# Patient Record
Sex: Female | Born: 1950 | Race: Black or African American | Hispanic: No | Marital: Married | Smoking: Never smoker
Health system: Southern US, Community
[De-identification: ages and names within clinical notes are randomized; demographics above are authoritative.]

## PROBLEM LIST (undated history)

## (undated) DIAGNOSIS — E119 Type 2 diabetes mellitus without complications: Secondary | ICD-10-CM

---

## 2000-02-24 ENCOUNTER — Other Ambulatory Visit: Admission: RE | Admit: 2000-02-24 | Discharge: 2000-02-24 | Payer: Self-pay | Admitting: Internal Medicine

## 2001-02-14 ENCOUNTER — Encounter: Admission: RE | Admit: 2001-02-14 | Discharge: 2001-05-15 | Payer: Self-pay | Admitting: Internal Medicine

## 2002-01-20 ENCOUNTER — Ambulatory Visit (HOSPITAL_BASED_OUTPATIENT_CLINIC_OR_DEPARTMENT_OTHER): Admission: RE | Admit: 2002-01-20 | Discharge: 2002-01-20 | Payer: Self-pay | Admitting: Orthopedic Surgery

## 2002-11-22 ENCOUNTER — Encounter: Admission: RE | Admit: 2002-11-22 | Discharge: 2002-11-22 | Payer: Self-pay | Admitting: Internal Medicine

## 2002-11-22 ENCOUNTER — Encounter: Payer: Self-pay | Admitting: Internal Medicine

## 2008-04-21 ENCOUNTER — Emergency Department (HOSPITAL_COMMUNITY): Admission: EM | Admit: 2008-04-21 | Discharge: 2008-04-21 | Payer: Self-pay | Admitting: Emergency Medicine

## 2010-06-24 LAB — URINALYSIS, ROUTINE W REFLEX MICROSCOPIC
Ketones, ur: NEGATIVE mg/dL
Leukocytes, UA: NEGATIVE
Nitrite: NEGATIVE
Protein, ur: NEGATIVE mg/dL

## 2010-06-24 LAB — CBC
HCT: 36.6 % (ref 36.0–46.0)
MCV: 86.3 fL (ref 78.0–100.0)
Platelets: 236 10*3/uL (ref 150–400)
RDW: 13.5 % (ref 11.5–15.5)

## 2010-06-24 LAB — COMPREHENSIVE METABOLIC PANEL
Albumin: 3.9 g/dL (ref 3.5–5.2)
BUN: 15 mg/dL (ref 6–23)
Creatinine, Ser: 0.67 mg/dL (ref 0.4–1.2)
Potassium: 3.7 mEq/L (ref 3.5–5.1)
Total Protein: 7 g/dL (ref 6.0–8.3)

## 2010-06-24 LAB — DIFFERENTIAL
Lymphocytes Relative: 44 % (ref 12–46)
Monocytes Absolute: 0.4 10*3/uL (ref 0.1–1.0)
Monocytes Relative: 9 % (ref 3–12)
Neutro Abs: 1.9 10*3/uL (ref 1.7–7.7)

## 2010-06-24 LAB — PREGNANCY, URINE: Preg Test, Ur: NEGATIVE

## 2010-06-24 LAB — TYPE AND SCREEN
ABO/RH(D): O POS
Antibody Screen: NEGATIVE

## 2010-07-25 NOTE — Op Note (Signed)
Jacqueline Bell, Jacqueline Bell                        ACCOUNT NO.:  [id number]   MEDICAL RECORD NO.:  [id number]                   PATIENT TYPE:  AMB   LOCATION:  [facility]SC                                  FACILITY:  [facility]   PHYSICIAN:  Harvie Junior, M.D.                DATE OF BIRTH:  [birth year]   DATE OF PROCEDURE:  01/20/2002  DATE OF DISCHARGE:                                 OPERATIVE REPORT   PREOPERATIVE DIAGNOSIS:  Medial compartment pain with suspected meniscal  tear.   POSTOPERATIVE DIAGNOSES:  1. Medial meniscal tear.  2. 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  3. Chondromalacia of the patella.   PROCEDURES:  1. Debridement of chondromalacia of the medial femoral condyle and medial     tibial plateau.  2. Resection of medial meniscus tear.  3. Debridement of lateral meniscal tear.  4. Debridement of chondromalacia of the patella.   SURGEON:  Harvie Junior, M.D.   ASSISTANT:  Marshia Ly, P.A.   ANESTHESIA:  Knee block with MAC.   BRIEF HISTORY:  She is a 60 year old female with a long history of having  significant complaints over the medial compartment.  She ultimately had  significant pain with twisting and movement and because of continued  complaints of pain on the medial side, she was ultimately taken to the  operating room for operative knee arthroscopy.   DESCRIPTION OF PROCEDURE:  The patient was taken to the operating room where  after adequate anesthesia was obtained with a general anesthetic, the  patient underwent a knee block.  The patient was placed supine upon the  operating table.  The left leg was prepped and draped in the usual sterile  fashion and following this, routine arthroscopic examination of the knee  revealed that there was an obvious chondromalacia of the medial femoral  condyle and medial tibial plateau.  The medial meniscus was torn on the  posterior portion.  At this point the posterior horn of the  medial meniscus  was debrided with a combination of straight biting forceps, upbiting  forceps.  The remainder of the meniscal rim was contoured down with a  suction shaver.  Following this, the attention was turned to the patella  where there was some chondromalacia of the patella, which was debrided.  The  lateral side was examined and noted to have a little bit of fibrillation on  the outer edge of the meniscus, not felt to be enough to need debridement  other than with a suction shaver.  The lateral femoral condyle looked  without abnormality.  Following this the medial femoral condyle and medial  tibial plateau were debrided to a smooth and stable rim.  Once this was  accomplished, the knee was then copiously irrigated and suctioned dry.  A  final check was made for any loose or fragmenting pieces.  The patient was  taken  to the recovery room, where she was noted to be in satisfactory  condition.  Estimated blood loss for the procedure was none.                                               Harvie Junior, M.D.    Ranae Plumber  D:  01/20/2002  T:  01/20/2002  Job:  [id number]

## 2016-10-05 ENCOUNTER — Emergency Department (HOSPITAL_COMMUNITY)
Admission: EM | Admit: 2016-10-05 | Discharge: 2016-10-05 | Disposition: A | Discharge status: Home / Self Care | Payer: TRICARE For Life (TFL) | Attending: Emergency Medicine | Admitting: Emergency Medicine

## 2016-10-05 ENCOUNTER — Encounter (HOSPITAL_COMMUNITY): Payer: Self-pay | Admitting: Emergency Medicine

## 2016-10-05 DIAGNOSIS — Y658 Other specified misadventures during surgical and medical care: Secondary | ICD-10-CM | POA: Diagnosis not present

## 2016-10-05 DIAGNOSIS — E119 Type 2 diabetes mellitus without complications: Secondary | ICD-10-CM | POA: Insufficient documentation

## 2016-10-05 DIAGNOSIS — Z794 Long term (current) use of insulin: Secondary | ICD-10-CM | POA: Diagnosis not present

## 2016-10-05 DIAGNOSIS — R05 Cough: Secondary | ICD-10-CM | POA: Diagnosis not present

## 2016-10-05 DIAGNOSIS — Z79899 Other long term (current) drug therapy: Secondary | ICD-10-CM | POA: Diagnosis not present

## 2016-10-05 DIAGNOSIS — R0981 Nasal congestion: Secondary | ICD-10-CM | POA: Insufficient documentation

## 2016-10-05 DIAGNOSIS — T7840XA Allergy, unspecified, initial encounter: Secondary | ICD-10-CM | POA: Diagnosis not present

## 2016-10-05 DIAGNOSIS — R21 Rash and other nonspecific skin eruption: Secondary | ICD-10-CM | POA: Diagnosis present

## 2016-10-05 DIAGNOSIS — T50905A Adverse effect of unspecified drugs, medicaments and biological substances, initial encounter: Secondary | ICD-10-CM | POA: Diagnosis not present

## 2016-10-05 HISTORY — DX: Type 2 diabetes mellitus without complications: E11.9

## 2016-10-05 LAB — CBG MONITORING, ED: Glucose-Capillary: 318 mg/dL — ABNORMAL HIGH (ref 65–99)

## 2016-10-05 MED ORDER — CETIRIZINE HCL 10 MG PO TABS
10.0000 mg | ORAL_TABLET | Freq: Every day | ORAL | 0 refills | Status: DC | PRN
Start: 2016-10-05 — End: 2018-02-24

## 2016-10-05 MED ORDER — PREDNISONE 20 MG PO TABS: 40.0000 mg | ORAL_TABLET | Freq: Every day | ORAL | 0 refills | Status: DC

## 2016-10-05 NOTE — ED Provider Notes (Signed)
[facility] DEPT Provider Note   CSN: [id number] Arrival date & time: 10/05/16  1012     History   Chief Complaint Chief Complaint  Patient presents with  . Allergic Reaction    HPI Jacqueline SakaiRosalind R Hovanec is a 66 y.o. female.  HPI Patient presents with rash and itchy for the last around 5 days. Began at injection sites for her insulin or her Bydureon. She recently started the weekly injections of that. States it started with a purplish swollen area. Has site on her abdomen and right thigh. She has however developed a rash more diffusely. Also on the arms chest and back. No fevers. Has had some nasal congestion also. They go running. Occasional cough. No fevers. She has been on the Bydureon for about a month now. She has been on the NovoLog and Lantus prolonged without reactions in the past. She has taken Benadryl for states it makes her sleepy. Also makes her little nauseous.  Past Medical History:  Diagnosis Date  . Diabetes mellitus without complication (HCC)     There are no active problems to display for this patient.   History reviewed. No pertinent surgical history.  OB History    No data available       Home Medications    Prior to Admission medications   Medication Sig Start Date End Date Taking? Authorizing Provider  glimepiride (AMARYL) 1 MG tablet Take 1 mg by mouth daily with breakfast.   Yes [provider]  insulin aspart (NOVOLOG) 100 UNIT/ML injection Inject 0-10 Units into the skin 3 (three) times daily with meals. Per SS: < 70 = 0; 70-90 = 4 units; 91-130 = 6 units; 131-150 = 7 units; 151-200 = 8 units; 201-250 = 9 units; 251-300 = 10 units; 301-350 = 11 units; 351-400 = 12 units; 401-450 = 13 units; > 450 = 14 units.   Yes [provider]  insulin glargine (LANTUS) 100 unit/mL SOPN Inject 20-26 Units into the skin at bedtime.   Yes [provider]  meloxicam (MOBIC) 7.5 MG tablet Take 7.5 mg by mouth daily as needed for pain.   Yes  [provider]  cetirizine (ZYRTEC) 10 MG tablet Take 1 tablet (10 mg total) by mouth daily as needed for allergies. 10/05/16   Benjiman CorePickering, Amahri Dengel, MD  predniSONE (DELTASONE) 20 MG tablet Take 2 tablets (40 mg total) by mouth daily. 10/05/16   Benjiman CorePickering, Luqman Perrelli, MD    Family History History reviewed. No pertinent family history.  Social History Social History  Substance Use Topics  . Smoking status: Never Smoker  . Smokeless tobacco: Never Used  . Alcohol use No     Allergies   Patient has no known allergies.   Review of Systems Review of Systems  Constitutional: Negative for appetite change and fever.  HENT: Positive for congestion. Negative for facial swelling and trouble swallowing.   Respiratory: Positive for cough.   Gastrointestinal: Negative for abdominal pain.  Genitourinary: Negative for flank pain.  Musculoskeletal: Negative for back pain.  Skin: Positive for rash and wound.  Neurological: Negative for seizures.  Hematological: Negative for adenopathy.  Psychiatric/Behavioral: Negative for confusion.     Physical Exam Updated Vital Signs BP (!) 171/91 (BP Location: Right Arm)   Pulse 88   Temp 98.4 F (36.9 C) (Oral)   Resp 20   Ht 5\' 9"  (1.753 m)   Wt 94.3 kg (208 lb)   SpO2 97%   BMI 30.72 kg/m   Physical Exam  Constitutional: She appears well-developed.  HENT:  Head: Atraumatic.  Normal mucous membranes  Eyes: EOM are normal.  Cardiovascular: Normal rate.   Pulmonary/Chest: Effort normal.  Abdominal: Soft. She exhibits no distension.  Musculoskeletal: She exhibits no edema.  Neurological: She is alert.  Skin: Skin is warm.  Somewhat diffuse hives. Worse on extremities but does have a focus of worsening disease on her right thigh and lower right abdomen. On the center of the fine abdomen there is more indurated somewhat darkened area where she done the injections.  Psychiatric: She has a normal mood and affect.     ED Treatments /  Results  Labs (all labs ordered are listed, but only abnormal results are displayed) Labs Reviewed  CBG MONITORING, ED - Abnormal; Notable for the following:       Result Value   Glucose-Capillary 318 (*)    All other components within normal limits    EKG  EKG Interpretation None       Radiology No results found.  Procedures Procedures (including critical care time)  Medications Ordered in ED Medications - No data to display   Initial Impression / Assessment and Plan / ED Course  I have reviewed the triage vital signs and the nursing notes.  Pertinent labs & imaging results that were available during my care of the patient were reviewed by me and considered in my medical decision making (see chart for details).     Patient with like a reaction to her Bydureon. This is a new medication. Allergy versus side effect. We'll give steroids for 3 days. Add cetirizine. Patient instructed to watch her sugars closely.  Final Clinical Impressions(s) / ED Diagnoses   Final diagnoses:  Allergic reaction, initial encounter  Adverse reaction to drug in therapeutic use    New Prescriptions New Prescriptions   CETIRIZINE (ZYRTEC) 10 MG TABLET    Take 1 tablet (10 mg total) by mouth daily as needed for allergies.   PREDNISONE (DELTASONE) 20 MG TABLET    Take 2 tablets (40 mg total) by mouth daily.     Benjiman CorePickering, Dewain Platz, MD 10/05/16 [phone]

## 2016-10-05 NOTE — Discharge Instructions (Signed)
Take Benadryl and Zyrtec as needed. Take prednisone for the next 3 days but watch her sugars. Call Dr. Allyne GeeSanders about adjustment of your medications.

## 2016-10-05 NOTE — ED Triage Notes (Addendum)
Patient complaining of generalized hives and itching x 5 days. States she now has cough and nasal congestion. Patient states "I think it has something to do with my novolog because I have the red places where I give myself the shots."

## 2017-02-08 DIAGNOSIS — Z1231 Encounter for screening mammogram for malignant neoplasm of breast: Secondary | ICD-10-CM | POA: Diagnosis not present

## 2017-04-29 DIAGNOSIS — E559 Vitamin D deficiency, unspecified: Secondary | ICD-10-CM | POA: Diagnosis not present

## 2017-04-29 DIAGNOSIS — E119 Type 2 diabetes mellitus without complications: Secondary | ICD-10-CM | POA: Diagnosis not present

## 2017-04-29 DIAGNOSIS — M25522 Pain in left elbow: Secondary | ICD-10-CM | POA: Diagnosis not present

## 2017-04-29 DIAGNOSIS — Z23 Encounter for immunization: Secondary | ICD-10-CM | POA: Diagnosis not present

## 2017-04-29 DIAGNOSIS — Z0000 Encounter for general adult medical examination without abnormal findings: Secondary | ICD-10-CM | POA: Diagnosis not present

## 2017-04-29 DIAGNOSIS — I10 Essential (primary) hypertension: Secondary | ICD-10-CM | POA: Diagnosis not present

## 2017-04-29 DIAGNOSIS — E1165 Type 2 diabetes mellitus with hyperglycemia: Secondary | ICD-10-CM | POA: Diagnosis not present

## 2017-06-09 DIAGNOSIS — H04123 Dry eye syndrome of bilateral lacrimal glands: Secondary | ICD-10-CM | POA: Diagnosis not present

## 2017-06-09 DIAGNOSIS — H35372 Puckering of macula, left eye: Secondary | ICD-10-CM | POA: Diagnosis not present

## 2017-06-09 DIAGNOSIS — Z961 Presence of intraocular lens: Secondary | ICD-10-CM | POA: Diagnosis not present

## 2017-06-09 DIAGNOSIS — H26492 Other secondary cataract, left eye: Secondary | ICD-10-CM | POA: Diagnosis not present

## 2017-06-09 LAB — HM DIABETES EYE EXAM

## 2017-07-29 DIAGNOSIS — E11311 Type 2 diabetes mellitus with unspecified diabetic retinopathy with macular edema: Secondary | ICD-10-CM | POA: Diagnosis not present

## 2017-07-29 DIAGNOSIS — Z794 Long term (current) use of insulin: Secondary | ICD-10-CM | POA: Diagnosis not present

## 2017-07-29 DIAGNOSIS — E785 Hyperlipidemia, unspecified: Secondary | ICD-10-CM | POA: Diagnosis not present

## 2017-07-29 DIAGNOSIS — I10 Essential (primary) hypertension: Secondary | ICD-10-CM | POA: Diagnosis not present

## 2017-10-27 DIAGNOSIS — E785 Hyperlipidemia, unspecified: Secondary | ICD-10-CM | POA: Diagnosis not present

## 2017-10-27 DIAGNOSIS — M25522 Pain in left elbow: Secondary | ICD-10-CM | POA: Diagnosis not present

## 2017-10-27 DIAGNOSIS — E11311 Type 2 diabetes mellitus with unspecified diabetic retinopathy with macular edema: Secondary | ICD-10-CM | POA: Diagnosis not present

## 2018-01-17 ENCOUNTER — Ambulatory Visit: Payer: Self-pay | Admitting: Nurse Practitioner

## 2018-02-24 ENCOUNTER — Ambulatory Visit (INDEPENDENT_AMBULATORY_CARE_PROVIDER_SITE_OTHER): Payer: Medicare Other | Admitting: Nurse Practitioner

## 2018-02-24 ENCOUNTER — Encounter: Payer: Self-pay | Admitting: Nurse Practitioner

## 2018-02-24 VITALS — BP 142/82 | HR 72 | Temp 98.4°F | Ht 65.8 in | Wt 197.8 lb

## 2018-02-24 DIAGNOSIS — Z91018 Allergy to other foods: Secondary | ICD-10-CM

## 2018-02-24 DIAGNOSIS — I10 Essential (primary) hypertension: Secondary | ICD-10-CM | POA: Diagnosis not present

## 2018-02-24 DIAGNOSIS — R252 Cramp and spasm: Secondary | ICD-10-CM | POA: Insufficient documentation

## 2018-02-24 DIAGNOSIS — R030 Elevated blood-pressure reading, without diagnosis of hypertension: Secondary | ICD-10-CM

## 2018-02-24 DIAGNOSIS — G4700 Insomnia, unspecified: Secondary | ICD-10-CM

## 2018-02-24 DIAGNOSIS — E119 Type 2 diabetes mellitus without complications: Secondary | ICD-10-CM

## 2018-02-24 DIAGNOSIS — Z794 Long term (current) use of insulin: Secondary | ICD-10-CM | POA: Diagnosis not present

## 2018-02-24 MED ORDER — SEMAGLUTIDE(0.25 OR 0.5MG/DOS) 2 MG/1.5ML ~~LOC~~ SOPN: 1.0000 mg | PEN_INJECTOR | SUBCUTANEOUS | 1 refills | Status: DC

## 2018-02-24 NOTE — Progress Notes (Addendum)
Subjective:     Patient ID: Jacqueline SakaiRosalind R Ehler , female    DOB: 05/16/1950 , 67 y.o.   MRN: [id number]   Chief Complaint  Patient presents with  . Hypertension  . Diabetes  . Referral    patient states she wants a referral to see an allergist she wants to know if she is allergic to anything     HPI  Hypertension  This is a chronic problem. The current episode started more than 1 year ago. The problem has been gradually worsening since onset. The problem is uncontrolled. Pertinent negatives include no anxiety, chest pain, malaise/fatigue or palpitations. There are no associated agents to hypertension. There are no known risk factors for coronary artery disease. Past treatments include diuretics. There are no compliance problems.  There is no history of angina. There is no history of chronic renal disease.  Diabetes  She presents for her follow-up diabetic visit. She has type 2 diabetes mellitus. Her disease course has been stable. Pertinent negatives for diabetes include no chest pain. Symptoms are stable. She is following a generally healthy diet. When asked about meal planning, she reported none. She has not had a previous visit with a dietitian. She participates in exercise weekly. (112 this am and up to 225, had forgotten her insulin.  ) Eye exam is current (april 3rd, negative for retinopathy).  Insomnia  Primary symptoms: fragmented sleep (up using the bathroom frequently), frequent awakening, no malaise/fatigue.  The current episode started more than one month. The onset quality is gradual. The problem occurs intermittently. How many beverages per day that contain caffeine: 0 - 1.  The symptoms are relieved by quiet environment. The treatment provided no relief. PMH includes: associated symptoms present, no chronic pain. Prior diagnostic workup includes:  No prior workup.     Past Medical History:  Diagnosis Date  . Diabetes mellitus without complication (HCC)      No family history  on file.   Current Outpatient Medications:  .  cholecalciferol (VITAMIN D) 1000 units tablet, Take 1,000 Units by mouth daily., Disp: , Rfl:  .  insulin aspart (NOVOLOG) 100 UNIT/ML injection, Inject 0-10 Units into the skin 3 (three) times daily with meals. Per SS: < 70 = 0; 70-90 = 4 units; 91-130 = 6 units; 131-150 = 7 units; 151-200 = 8 units; 201-250 = 9 units; 251-300 = 10 units; 301-350 = 11 units; 351-400 = 12 units; 401-450 = 13 units; > 450 = 14 units., Disp: , Rfl:  .  insulin glargine (LANTUS) 100 unit/mL SOPN, Inject 20-26 Units into the skin at bedtime., Disp: , Rfl:  .  meloxicam (MOBIC) 7.5 MG tablet, Take 7.5 mg by mouth daily as needed for pain., Disp: , Rfl:  .  Multiple Vitamin (MULTIVITAMIN) tablet, Take 1 tablet by mouth daily., Disp: , Rfl:  .  Omega-3 Fatty Acids (FISH OIL PO), Take 1 capsule by mouth daily., Disp: , Rfl:  .  POTASSIUM BICARBONATE PO, Take 1 tablet by mouth. Patient takes 2 tablets daily, Disp: , Rfl:  .  Semaglutide,0.25 or 0.5MG /DOS, (OZEMPIC, 0.25 OR 0.5 MG/DOSE,) 2 MG/1.5ML SOPN, Inject into the skin. Inject 0.5mg  by subcutaenous route once weekly into the abdomen, thigh or upper arm rotating injection sites, Disp: , Rfl:  .  telmisartan (MICARDIS) 20 MG tablet, Take 20 mg by mouth daily., Disp: , Rfl:    No Known Allergies   Review of Systems  Constitutional: Negative for malaise/fatigue.  Eyes: Negative.  Respiratory: Negative.   Cardiovascular: Negative for chest pain, palpitations and leg swelling.  Gastrointestinal: Negative.   Musculoskeletal: Negative.   Psychiatric/Behavioral: The patient has insomnia.      Today's Vitals   02/24/18 1155  BP: (!) 142/82  Pulse: 72  Temp: 98.4 F (36.9 C)  TempSrc: Oral  SpO2: 96%  Weight: 197 lb 12.8 oz (89.7 kg)  Height: 5' 5.8" (1.671 m)  PainSc: 0-No pain   Body mass index is 32.12 kg/m.   Objective:  Physical Exam Constitutional:      Appearance: Normal appearance.  Cardiovascular:      Rate and Rhythm: Normal rate.  Pulmonary:     Effort: Pulmonary effort is normal.     Breath sounds: Normal breath sounds.  Musculoskeletal: Normal range of motion.  Skin:    General: Skin is warm and dry.     Capillary Refill: Capillary refill takes less than 2 seconds.  Neurological:     General: No focal deficit present.     Mental Status: She is alert and oriented to person, place, and time.  Psychiatric:        Mood and Affect: Mood normal.        Assessment And Plan:     1. Type 2 diabetes mellitus without complication, with long-term current use of insulin (HCC)  Chronic, controlled  Continue with current medications  Encouraged to limit intake of sugary foods and drinks  Encouraged to increase physical activity to 150 minutes per week  She is doing better on ozempic, will increase to 1 mg weekly - Semaglutide,0.25 or 0.5MG /DOS, (OZEMPIC, 0.25 OR 0.5 MG/DOSE,) 2 MG/1.5ML SOPN; Inject 1 mg into the skin once a week. Inject 0.5mg  by subcutaenous route once weekly into the abdomen, thigh or upper arm rotating injection sites  Dispense: 6 pen; Refill: 1 - Hemoglobin A1c - CMP14 + Anion Gap  2. 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 Cramp and spasm  Will check magnesium and CK due to persistent muscle cramping  Encouraged to stay well hydrated and take magnesium 200 mg daily at dinner time - Magnesium - CK, total  4. Insomnia, unspecified type  Change the environment  Take melatonin as needed   Limit intake of water after 7p  5. Elevated blood pressure reading without diagnosis of hypertension  Slightly elevated blood pressure today she does not have a history of HTN  On Telmisartan for kidney protection.   She is encouraged to increase her water intake which can improve her blood pressure   Will reevaluate at the next visit  Arnette FeltsJanece Mukund Weinreb, FNP

## 2018-02-24 NOTE — Patient Instructions (Addendum)
Leg Cramps Leg cramps occur when one or more muscles tighten and you have no control over this tightening (involuntary muscle contraction). Muscle cramps can develop in any muscle, but the most common place is in the calf muscles of the leg. Those cramps can occur during exercise or when you are at rest. Leg cramps are painful, and they may last for a few seconds to a few minutes. Cramps may return several times before they finally stop. Usually, leg cramps are not caused by a serious medical problem. In many cases, the cause is not known. Some common causes include:  Excessive physical effort (overexertion), such as during intense exercise.  Overuse from repetitive motions, or doing the same thing over and over.  Staying in a certain position for a long period of time.  Improper preparation, form, or technique while performing a sport or an activity.  Dehydration.  Injury.  Side effects of certain medicines.  Abnormally low levels of minerals in your blood (electrolytes), especially potassium and calcium. This could result from: ? Pregnancy. ? Taking diuretic medicines. Follow these instructions at home: Eating and drinking  Drink enough fluid to keep your urine pale yellow. Staying hydrated may help prevent cramps.  Eat a healthy diet that includes plenty of nutrients to help your muscles function. A healthy diet includes fruits and vegetables, lean protein, whole grains, and low-fat or nonfat dairy products. Managing pain, stiffness, and swelling      Try massaging, stretching, and relaxing the affected muscle. Do this for several minutes at a time.  If directed, put ice on areas that are sore or painful after a cramp: ? Put ice in a plastic bag. ? Place a towel between your skin and the bag. ? Leave the ice on for 20 minutes, 2-3 times a day.  If directed, apply heat to muscles that are tense or tight. Do this before you exercise, or as often as told by your health care  provider. Use the heat source that your health care provider recommends, such as a moist heat pack or a heating pad. ? Place a towel between your skin and the heat source. ? Leave the heat on for 20-30 minutes. ? Remove the heat if your skin turns bright red. This is especially important if you are unable to feel pain, heat, or cold. You may have a greater risk of getting burned.  Try taking hot showers or baths to help relax tight muscles. General instructions  If you are having frequent leg cramps, avoid intense exercise for several days.  Take over-the-counter and prescription medicines only as told by your health care provider.  Keep all follow-up visits as told by your health care provider. This is important. Contact a health care provider if:  Your leg cramps get more severe or more frequent, or they do not improve over time.  Your foot becomes cold, numb, or blue. Summary  Muscle cramps can develop in any muscle, but the most common place is in the calf muscles of the leg.  Leg cramps are painful, and they may last for a few seconds to a few minutes.  Usually, leg cramps are not caused by a serious medical problem. Often, the cause is not known.  Stay hydrated and take over-the-counter and prescription medicines only as told by your health care provider. This information is not intended to replace advice given to you by your health care provider. Make sure you discuss any questions you have with your health care  provider. Document Released: 04/02/2004 Document Revised: 12/03/2016 Document Reviewed: 12/03/2016 Elsevier Interactive Patient Education  2019 [employer]    Insomnia Insomnia is a sleep disorder that makes it difficult to fall asleep or stay asleep. Insomnia can cause fatigue, low energy, difficulty concentrating, mood swings, and poor performance at work or school. There are three different ways to classify insomnia:  Difficulty falling asleep.  Difficulty  staying asleep.  Waking up too early in the morning. Any type of insomnia can be long-term (chronic) or short-term (acute). Both are common. Short-term insomnia usually lasts for three months or less. Chronic insomnia occurs at least three times a week for longer than three months. What are the causes? Insomnia may be caused by another condition, situation, or substance, such as:  Anxiety.  Certain medicines.  Gastroesophageal reflux disease (GERD) or other gastrointestinal conditions.  Asthma or other breathing conditions.  Restless legs syndrome, sleep apnea, or other sleep disorders.  Chronic pain.  Menopause.  Stroke.  Abuse of alcohol, tobacco, or illegal drugs.  Mental health conditions, such as depression.  Caffeine.  Neurological disorders, such as Alzheimer's disease.  An overactive thyroid (hyperthyroidism). Sometimes, the cause of insomnia may not be known. What increases the risk? Risk factors for insomnia include:  Gender. Women are affected more often than men.  Age. Insomnia is more common as you get older.  Stress.  Lack of exercise.  Irregular work schedule or working night shifts.  Traveling between different time zones.  Certain medical and mental health conditions. What are the signs or symptoms? If you have insomnia, the main symptom is having trouble falling asleep or having trouble staying asleep. This may lead to other symptoms, such as:  Feeling fatigued or having low energy.  Feeling nervous about going to sleep.  Not feeling rested in the morning.  Having trouble concentrating.  Feeling irritable, anxious, or depressed. How is this diagnosed? This condition may be diagnosed based on:  Your symptoms and medical history. Your health care provider may ask about: ? Your sleep habits. ? Any medical conditions you have. ? Your mental health.  A physical exam. How is this treated? Treatment for insomnia depends on the cause.  Treatment may focus on treating an underlying condition that is causing insomnia. Treatment may also include:  Medicines to help you sleep.  Counseling or therapy.  Lifestyle adjustments to help you sleep better. Follow these instructions at home: Eating and drinking   Limit or avoid alcohol, caffeinated beverages, and cigarettes, especially close to bedtime. These can disrupt your sleep.  Do not eat a large meal or eat spicy foods right before bedtime. This can lead to digestive discomfort that can make it hard for you to sleep. Sleep habits   Keep a sleep diary to help you and your health care provider figure out what could be causing your insomnia. Write down: ? When you sleep. ? When you wake up during the night. ? How well you sleep. ? How rested you feel the next day. ? Any side effects of medicines you are taking. ? What you eat and drink.  Make your bedroom a dark, comfortable place where it is easy to fall asleep. ? Put up shades or blackout curtains to block light from outside. ? Use a white noise machine to block noise. ? Keep the temperature cool.  Limit screen use before bedtime. This includes: ? Watching TV. ? Using your smartphone, tablet, or computer.  Stick to a routine that includes  going to bed and waking up at the same times every day and night. This can help you fall asleep faster. Consider making a quiet activity, such as reading, part of your nighttime routine.  Try to avoid taking naps during the day so that you sleep better at night.  Get out of bed if you are still awake after 15 minutes of trying to sleep. Keep the lights down, but try reading or doing a quiet activity. When you feel sleepy, go back to bed. General instructions  Take over-the-counter and prescription medicines only as told by your health care provider.  Exercise regularly, as told by your health care provider. Avoid exercise starting several hours before bedtime.  Use relaxation  techniques to manage stress. Ask your health care provider to suggest some techniques that may work well for you. These may include: ? Breathing exercises. ? Routines to release muscle tension. ? Visualizing peaceful scenes.  Make sure that you drive carefully. Avoid driving if you feel very sleepy.  Keep all follow-up visits as told by your health care provider. This is important. Contact a health care provider if:  You are tired throughout the day.  You have trouble in your daily routine due to sleepiness.  You continue to have sleep problems, or your sleep problems get worse. Get help right away if:  You have serious thoughts about hurting yourself or someone else. If you ever feel like you may hurt yourself or others, or have thoughts about taking your own life, get help right away. You can go to your nearest emergency department or call:  Your local emergency services (911 in the [place]  A suicide crisis helpline, such as the National Suicide Prevention Lifeline at 80318367141-(843) 281-2496. This is open 24 hours a day. Summary  Insomnia is a sleep disorder that makes it difficult to fall asleep or stay asleep.  Insomnia can be long-term (chronic) or short-term (acute).  Treatment for insomnia depends on the cause. Treatment may focus on treating an underlying condition that is causing insomnia.  Keep a sleep diary to help you and your health care provider figure out what could be causing your insomnia.     Change the environment  Take melatonin as needed   Limit intake of water after 7p    This information is not intended to replace advice given to you by your health care provider. Make sure you discuss any questions you have with your health care provider. Document Released: 02/21/2000 Document Revised: 12/03/2016 Document Reviewed: 12/03/2016 Elsevier Interactive Patient Education  2019 [employer]  Place diabetes mellitus type 2 patient instructions here.

## 2018-02-25 LAB — HEMOGLOBIN A1C
ESTIMATED AVERAGE GLUCOSE: 177 mg/dL
HEMOGLOBIN A1C: 7.8 % — AB (ref 4.8–5.6)

## 2018-02-25 LAB — CMP14 + ANION GAP
A/G RATIO: 1.6 (ref 1.2–2.2)
ALT: 13 IU/L (ref 0–32)
AST: 19 IU/L (ref 0–40)
Albumin: 4.4 g/dL (ref 3.6–4.8)
Alkaline Phosphatase: 47 IU/L (ref 39–117)
Anion Gap: 13 mmol/L (ref 10.0–18.0)
BUN/Creatinine Ratio: 15 (ref 12–28)
BUN: 11 mg/dL (ref 8–27)
Bilirubin Total: 0.4 mg/dL (ref 0.0–1.2)
CHLORIDE: 100 mmol/L (ref 96–106)
CO2: 26 mmol/L (ref 20–29)
Calcium: 9.3 mg/dL (ref 8.7–10.3)
Creatinine, Ser: 0.73 mg/dL (ref 0.57–1.00)
GFR calc Af Amer: 99 mL/min/{1.73_m2} (ref >59)
GFR calc non Af Amer: 86 mL/min/{1.73_m2} (ref >59)
GLUCOSE: 121 mg/dL — AB (ref 65–99)
Globulin, Total: 2.8 g/dL (ref 1.5–4.5)
POTASSIUM: 3.8 mmol/L (ref 3.5–5.2)
Sodium: 139 mmol/L (ref 134–144)
Total Protein: 7.2 g/dL (ref 6.0–8.5)

## 2018-02-25 LAB — LIPID PANEL
Chol/HDL Ratio: 4 ratio (ref 0.0–4.4)
Cholesterol, Total: 216 mg/dL — ABNORMAL HIGH (ref 100–199)
HDL: 54 mg/dL (ref >39)
LDL Calculated: 147 mg/dL — ABNORMAL HIGH (ref 0–99)
Triglycerides: 75 mg/dL (ref 0–149)
VLDL Cholesterol Cal: 15 mg/dL (ref 5–40)

## 2018-02-25 LAB — CK: Total CK: 284 U/L — ABNORMAL HIGH (ref 24–173)

## 2018-02-25 LAB — MAGNESIUM: MAGNESIUM: 1.9 mg/dL (ref 1.6–2.3)

## 2018-03-01 ENCOUNTER — Other Ambulatory Visit: Payer: Self-pay

## 2018-03-01 DIAGNOSIS — E119 Type 2 diabetes mellitus without complications: Secondary | ICD-10-CM

## 2018-03-01 DIAGNOSIS — Z794 Long term (current) use of insulin: Principal | ICD-10-CM

## 2018-03-01 MED ORDER — SEMAGLUTIDE(0.25 OR 0.5MG/DOS) 2 MG/1.5ML ~~LOC~~ SOPN: 1.0000 mg | PEN_INJECTOR | SUBCUTANEOUS | 1 refills | Status: DC

## 2018-04-04 ENCOUNTER — Ambulatory Visit (INDEPENDENT_AMBULATORY_CARE_PROVIDER_SITE_OTHER): Payer: Medicare Other | Admitting: Allergy and Immunology

## 2018-04-04 ENCOUNTER — Encounter: Payer: Self-pay | Admitting: Allergy and Immunology

## 2018-04-04 VITALS — BP 150/70 | HR 67 | Resp 16 | Ht 67.5 in | Wt 200.0 lb

## 2018-04-04 DIAGNOSIS — J3089 Other allergic rhinitis: Secondary | ICD-10-CM | POA: Diagnosis not present

## 2018-04-04 DIAGNOSIS — L500 Allergic urticaria: Secondary | ICD-10-CM | POA: Diagnosis not present

## 2018-04-04 DIAGNOSIS — T781XXA Other adverse food reactions, not elsewhere classified, initial encounter: Secondary | ICD-10-CM | POA: Insufficient documentation

## 2018-04-04 DIAGNOSIS — H1013 Acute atopic conjunctivitis, bilateral: Secondary | ICD-10-CM | POA: Diagnosis not present

## 2018-04-04 DIAGNOSIS — T7840XD Allergy, unspecified, subsequent encounter: Secondary | ICD-10-CM

## 2018-04-04 DIAGNOSIS — T781XXD Other adverse food reactions, not elsewhere classified, subsequent encounter: Secondary | ICD-10-CM

## 2018-04-04 DIAGNOSIS — H1010 Acute atopic conjunctivitis, unspecified eye: Secondary | ICD-10-CM | POA: Insufficient documentation

## 2018-04-04 DIAGNOSIS — Z91018 Allergy to other foods: Secondary | ICD-10-CM

## 2018-04-04 DIAGNOSIS — T7840XA Allergy, unspecified, initial encounter: Secondary | ICD-10-CM | POA: Insufficient documentation

## 2018-04-04 MED ORDER — OLOPATADINE HCL 0.2 % OP SOLN: 1.0000 [drp] | Freq: Every day | OPHTHALMIC | 5 refills | Status: DC

## 2018-04-04 MED ORDER — EPINEPHRINE 0.3 MG/0.3ML IJ SOAJ: 0.3000 mg | Freq: Once | INTRAMUSCULAR | 2 refills | Status: AC

## 2018-04-04 MED ORDER — FLUTICASONE PROPIONATE 50 MCG/ACT NA SUSP: 1.0000 | Freq: Every day | NASAL | 5 refills | Status: DC

## 2018-04-04 MED ORDER — FEXOFENADINE HCL 180 MG PO TABS: 180.0000 mg | ORAL_TABLET | Freq: Every day | ORAL | 5 refills | Status: DC

## 2018-04-04 NOTE — Progress Notes (Signed)
New Patient Note  RE: Jacqueline Bell MRN: [id number] DOB: 07/25/1950 Date of Office Visit: 04/04/2018  Referring provider: Arnette FeltsMoore, Janece, FNP Primary care provider: Dorothyann PengSanders, Robyn, MD  Chief Complaint: Allergic Reaction; Food Intolerance; and Allergic Rhinitis    History of present illness: Jacqueline Bell is a 68 y.o. female seen today in consultation requested by Arnette FeltsJanece Moore, FNP.  She reports that when she was a child she developed hives on her hands after eating shrimp.  However this problem "went away" for several decades.  However, in August 2019 she consumed shrimp and within minutes developed generalized pruritus and urticaria.  She denies concomitant angioedema, cardiopulmonary symptoms, or GI symptoms, however proceeded to the emergency department for evaluation and treatment.  She states that she was not prescribed an epinephrine autoinjector.  She reports that if she eats finned fish she experiences severe heartburn.  She experiences mild oropharyngeal pruritus when consuming fresh pineapple.  If the pineapple is processed or cooked she does not experience the oropharyngeal pruritus. [name] experiences nasal congestion, rhinorrhea, sneezing, nasal pruritus, and ocular pruritus.  These symptoms occur year around but are most frequent and severe with pollen exposure, particularly in the springtime.  She attempts to control the symptoms with cetirizine, fexofenadine, and/or over-the-counter Visine eyedrops.  Assessment and plan: History of food allergy Possible food allergy.  The patients history suggests shellfish allergy, though todays skin tests were negative despite a positive histamine control.  Food allergen skin testing has excellent negative predictive value however there is still a 5% chance that the allergy exists.  Therefore, we will investigate further with serum specific IgE levels and, if negative, open graded oral challenge.  A laboratory order form has been  provided for baseline serum tryptase level, as well as serum specific IgE against shellfish panel, fish panel, pineapple, and alpha gal panel.  Until the food allergy has been definitively ruled out, the patient is to continue meticulous avoidance and have access to epinephrine autoinjector 2 pack.  A prescription has been provided for epinephrine 0.3 mg autoinjector 2 pack along with instructions for its proper administration.  Seasonal and perennial allergic rhinitis  Aeroallergen avoidance measures have been discussed and provided in written form.  A prescription has been provided for fluticasone nasal spray, one spray per nostril 1-2 times daily as needed. Proper nasal spray technique has been discussed and demonstrated.  Nasal saline spray (i.e., Simply Saline) or nasal saline lavage (i.e., NeilMed) is recommended as needed and prior to medicated nasal sprays.  I have recommended fexofenadine (Allegra) 180 mg daily if needed.  If allergen avoidance measures and medications fail to adequately relieve symptoms, aeroallergen immunotherapy will be considered.  Allergic conjunctivitis  Treatment plan as outlined above for allergic rhinitis.  A prescription has been provided for Pataday, one drop per eye daily as needed.  I have also recommended eye lubricant drops (i.e., Natural Tears) as needed.  Oral allergy syndrome Oral allergy syndrome.  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 Peeling or cooking the food has shown to reduce symptoms and antihistamines may also relieve symptoms. Immunotherapy to the cross reacting pollens has improved or cured OAS in many patients, though this has not been consistent for all patients. Typically OAS is limited to itching or swelling of mucosal tissues from the lips to the back of the throat.   Information about OAS has been discussed and provided in written form.  All foods causing  symptoms are  to be avoided.  Should symptoms progress beyond the mouth and throat, epinephrine is to be administered and 911 is to be called immediately.   Meds ordered this encounter  Medications  . EPINEPHrine (EPIPEN 2-PAK) 0.3 mg/0.3 mL IJ SOAJ injection    Sig: Inject 0.3 mLs (0.3 mg total) into the muscle once for 1 dose.    Dispense:  2 Device    Refill:  2  . fluticasone (FLONASE) 50 MCG/ACT nasal spray    Sig: Place 1 spray into both nostrils daily.    Dispense:  16 g    Refill:  5  . Olopatadine HCl (PATADAY) 0.2 % SOLN    Sig: Place 1 drop into both eyes daily.    Dispense:  1 Bottle    Refill:  5  . fexofenadine (ALLEGRA) 180 MG tablet    Sig: Take 1 tablet (180 mg total) by mouth daily.    Dispense:  30 tablet    Refill:  5    Diagnostics: Epicutaneous testing: Robust reactivity to grass pollen, ragweed pollen, and tree pollen.  Positive to dust mite antigen. Intradermal testing: Positive to weed pollen mix and molds. Food allergen skin testing: Negative despite a positive histamine control.    Physical examination: Blood pressure (!) 150/70, pulse 67, resp. rate 16, height 5' 7.5" (1.715 m), weight 200 lb (90.7 kg), SpO2 98 %.  General: Alert, interactive, in no acute distress. HEENT: TMs pearly gray, turbinates moderately edematous with clear discharge, post-pharynx moderately erythematous. Neck: Supple without lymphadenopathy. Lungs: Clear to auscultation without wheezing, rhonchi or rales. CV: Normal S1, S2 without murmurs. Abdomen: Nondistended, nontender. Skin: Warm and dry, without lesions or rashes. Extremities:  No clubbing, cyanosis or edema. Neuro:   Grossly intact.  Review of systems:  Review of systems negative except as noted in HPI / PMHx or noted below: Review of Systems  Constitutional: Negative.   HENT: Negative.   Eyes: Negative.   Respiratory: Negative.   Cardiovascular: Negative.   Gastrointestinal: Negative.   Genitourinary: Negative.     Musculoskeletal: Negative.   Skin: Negative.   Neurological: Negative.   Endo/Heme/Allergies: Negative.   Psychiatric/Behavioral: Negative.     Past medical history:  Past Medical History:  Diagnosis Date  . Diabetes mellitus without complication [facility](HCC)     Past surgical history:  History reviewed. No pertinent surgical history.  Family history: History reviewed. No pertinent family history.  Social history: Social History   Socioeconomic History  . Marital status: Married    Spouse name: Not on file  . Number of children: Not on file  . Years of education: Not on file  . Highest education level: Not on file  Occupational History  . Not on file  Social Needs  . Financial resource strain: Not on file  . Food insecurity:    Worry: Not on file    Inability: Not on file  . Transportation needs:    Medical: Not on file    Non-medical: Not on file  Tobacco Use  . Smoking status: Never Smoker  . Smokeless tobacco: Never Used  Substance and Sexual Activity  . Alcohol use: No  . Drug use: No  . Sexual activity: Not on file  Lifestyle  . Physical activity:    Days per week: Not on file    Minutes per session: Not on file  . Stress: Not on file  Relationships  . Social connections:    Talks on phone: Not  on file    Gets together: Not on file    Attends religious service: Not on file    Active member of club or organization: Not on file    Attends meetings of clubs or organizations: Not on file    Relationship status: Not on file  . Intimate partner violence:    Fear of current or ex partner: Not on file    Emotionally abused: Not on file    Physically abused: Not on file    Forced sexual activity: Not on file  Other Topics Concern  . Not on file  Social History Narrative  . Not on file   Environmental History: The patient lives in a 67 year old house with hardwood floors throughout, oral heat, and central air.  She is a non-smoker without pets.  There is no  known mold/water damage in the home.  Allergies as of 04/04/2018   No Known Allergies     Medication List       Accurate as of April 04, 2018  3:32 PM. Always use your most recent med list.        cholecalciferol 1000 units tablet Commonly known as:  VITAMIN D Take 1,000 Units by mouth daily.   EPINEPHrine 0.3 mg/0.3 mL Soaj injection Commonly known as:  EPIPEN 2-PAK Inject 0.3 mLs (0.3 mg total) into the muscle once for 1 dose.   fexofenadine 180 MG tablet Commonly known as:  ALLEGRA Take 1 tablet (180 mg total) by mouth daily.   FISH OIL PO Take 1 capsule by mouth daily.   fluticasone 50 MCG/ACT nasal spray Commonly known as:  FLONASE Place 1 spray into both nostrils daily.   insulin aspart 100 UNIT/ML injection Commonly known as:  novoLOG Inject 0-10 Units into the skin 3 (three) times daily with meals. Per SS: < 70 = 0; 70-90 = 4 units; 91-130 = 6 units; 131-150 = 7 units; 151-200 = 8 units; 201-250 = 9 units; 251-300 = 10 units; 301-350 = 11 units; 351-400 = 12 units; 401-450 = 13 units; > 450 = 14 units.   insulin glargine 100 unit/mL Sopn Commonly known as:  LANTUS Inject 20-26 Units into the skin at bedtime.   meloxicam 15 MG tablet Commonly known as:  MOBIC   multivitamin tablet Take 1 tablet by mouth daily.   Olopatadine HCl 0.2 % Soln Commonly known as:  PATADAY Place 1 drop into both eyes daily.   POTASSIUM BICARBONATE PO Take 1 tablet by mouth. Patient takes 2 tablets daily   Semaglutide(0.25 or 0.5MG /DOS) 2 MG/1.5ML Sopn Commonly known as:  OZEMPIC (0.25 OR 0.5 MG/DOSE) Inject 1 mg into the skin once a week. Inject 0.5mg  by subcutaenous route once weekly into the abdomen, thigh or upper arm rotating injection sites   telmisartan 20 MG tablet Commonly known as:  MICARDIS Take 20 mg by mouth daily.       Known medication allergies: No Known Allergies  I appreciate the opportunity to take part in Inetta's care. Please do not hesitate to  contact me with questions.  Sincerely,   R. Jorene Guest, MD

## 2018-04-04 NOTE — Assessment & Plan Note (Signed)
   Aeroallergen avoidance measures have been discussed and provided in written form.  A prescription has been provided for fluticasone nasal spray, one spray per nostril 1-2 times daily as needed. Proper nasal spray technique has been discussed and demonstrated.  Nasal saline spray (i.e., Simply Saline) or nasal saline lavage (i.e., NeilMed) is recommended as needed and prior to medicated nasal sprays.  I have recommended fexofenadine (Allegra) 180 mg daily if needed.  If allergen avoidance measures and medications fail to adequately relieve symptoms, aeroallergen immunotherapy will be considered.

## 2018-04-04 NOTE — Assessment & Plan Note (Signed)
Possible food allergy.  The patients history suggests shellfish allergy, though todays skin tests were negative despite a positive histamine control.  Food allergen skin testing has excellent negative predictive value however there is still a 5% chance that the allergy exists.  Therefore, we will investigate further with serum specific IgE levels and, if negative, open graded oral challenge.  A laboratory order form has been provided for baseline serum tryptase level, as well as serum specific IgE against shellfish panel, fish panel, pineapple, and alpha gal panel.  Until the food allergy has been definitively ruled out, the patient is to continue meticulous avoidance and have access to epinephrine autoinjector 2 pack.  A prescription has been provided for epinephrine 0.3 mg autoinjector 2 pack along with instructions for its proper administration.

## 2018-04-04 NOTE — Assessment & Plan Note (Signed)
Oral allergy syndrome.  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 Peeling or cooking the food has shown to reduce symptoms and antihistamines may also relieve symptoms. Immunotherapy to the cross reacting pollens has improved or cured OAS in many patients, though this has not been consistent for all patients. Typically OAS is limited to itching or swelling of mucosal tissues from the lips to the back of the throat.   Information about OAS has been discussed and provided in written form.  All foods causing symptoms are to be avoided.  Should symptoms progress beyond the mouth and throat, epinephrine is to be administered and 911 is to be called immediately. 

## 2018-04-04 NOTE — Patient Instructions (Addendum)
History of food allergy Possible food allergy.  The patients history suggests shellfish allergy, though todays skin tests were negative despite a positive histamine control.  Food allergen skin testing has excellent negative predictive value however there is still a 5% chance that the allergy exists.  Therefore, we will investigate further with serum specific IgE levels and, if negative, open graded oral challenge.  A laboratory order form has been provided for baseline serum tryptase level, as well as serum specific IgE against shellfish panel, fish panel, pineapple, and alpha gal panel.  Until the food allergy has been definitively ruled out, the patient is to continue meticulous avoidance and have access to epinephrine autoinjector 2 pack.  A prescription has been provided for epinephrine 0.3 mg autoinjector 2 pack along with instructions for its proper administration.  Seasonal and perennial allergic rhinitis  Aeroallergen avoidance measures have been discussed and provided in written form.  A prescription has been provided for fluticasone nasal spray, one spray per nostril 1-2 times daily as needed. Proper nasal spray technique has been discussed and demonstrated.  Nasal saline spray (i.e., Simply Saline) or nasal saline lavage (i.e., NeilMed) is recommended as needed and prior to medicated nasal sprays.  I have recommended fexofenadine (Allegra) 180 mg daily if needed.  If allergen avoidance measures and medications fail to adequately relieve symptoms, aeroallergen immunotherapy will be considered.  Allergic conjunctivitis  Treatment plan as outlined above for allergic rhinitis.  A prescription has been provided for Pataday, one drop per eye daily as needed.  I have also recommended eye lubricant drops (i.e., Natural Tears) as needed.  Oral allergy syndrome Oral allergy syndrome.  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  Peeling or cooking the food has shown to reduce symptoms and antihistamines may also relieve symptoms. Immunotherapy to the cross reacting pollens has improved or cured OAS in many patients, though this has not been consistent for all patients. Typically OAS is limited to itching or swelling of mucosal tissues from the lips to the back of the throat.   Information about OAS has been discussed and provided in written form.  All foods causing symptoms are to be avoided.  Should symptoms progress beyond the mouth and throat, epinephrine is to be administered and 911 is to be called immediately.   When lab results have returned the patient will be called with further recommendations and follow up instructions.  Control of Dust Mite Allergen  House dust mites play a major role in allergic asthma and rhinitis.  They occur in environments with high humidity wherever human skin, the food for dust mites is found. High levels have been detected in dust obtained from mattresses, pillows, carpets, upholstered furniture, bed covers, clothes and soft toys.  The principal allergen of the house dust mite is found in its feces.  A gram of dust may contain 1,000 mites and 250,000 fecal particles.  Mite antigen is easily measured in the air during house cleaning activities.    1. Encase mattresses, including the box spring, and pillow, in an air tight cover.  Seal the zipper end of the encased mattresses with wide adhesive tape. 2. Wash the bedding in water of 130 degrees Farenheit weekly.  Avoid cotton comforters/quilts and flannel bedding: the most ideal bed covering is the dacron comforter. 3. Remove all upholstered furniture from the bedroom. 4. Remove carpets, carpet padding, rugs, and non-washable window drapes from the bedroom.  Wash drapes weekly or  use plastic window coverings. 5. Remove all non-washable stuffed toys from the bedroom.  Wash stuffed toys weekly. 6. Have the room cleaned frequently with a  vacuum cleaner and a damp dust-mop.  The patient should not be in a room which is being cleaned and should wait 1 hour after cleaning before going into the room. 7. Close and seal all heating outlets in the bedroom.  Otherwise, the room will become filled with dust-laden air.  An electric heater can be used to heat the room. Reduce indoor humidity to less than 50%.  Do not use a humidifier.   Reducing Pollen Exposure  The American Academy of Allergy, Asthma and Immunology suggests the following steps to reduce your exposure to pollen during allergy seasons.    1. Do not hang sheets or clothing out to dry; pollen may collect on these items. 2. Do not mow lawns or spend time around freshly cut grass; mowing stirs up pollen. 3. Keep windows closed at night.  Keep car windows closed while driving. 4. Minimize morning activities outdoors, a time when pollen counts are usually at their highest. 5. Stay indoors as much as possible when pollen counts or humidity is high and on windy days when pollen tends to remain in the air longer. 6. Use air conditioning when possible.  Many air conditioners have filters that trap the pollen spores. 7. Use a HEPA room air filter to remove pollen form the indoor air you breathe.   Control of Dog or Cat Allergen  Avoidance is the best way to manage a dog or cat allergy. If you have a dog or cat and are allergic to dog or cats, consider removing the dog or cat from the home. If you have a dog or cat but don't want to find it a new home, or if your family wants a pet even though someone in the household is allergic, here are some strategies that may help keep symptoms at bay:  1. Keep the pet out of your bedroom and restrict it to only a few rooms. Be advised that keeping the dog or cat in only one room will not limit the allergens to that room. 2. Don't pet, hug or kiss the dog or cat; if you do, wash your hands with soap and water. 3. High-efficiency particulate  air (HEPA) cleaners run continuously in a bedroom or living room can reduce allergen levels over time. 4. Place electrostatic material sheet in the air inlet vent in the bedroom. 5. Regular use of a high-efficiency vacuum cleaner or a central vacuum can reduce allergen levels. 6. Giving your dog or cat a bath at least once a week can reduce airborne allergen.   Control of Mold Allergen  Mold and fungi can grow on a variety of surfaces provided certain temperature and moisture conditions exist.  Outdoor molds grow on plants, decaying vegetation and soil.  The major outdoor mold, Alternaria and Cladosporium, are found in very high numbers during hot and dry conditions.  Generally, a late Summer - Fall peak is seen for common outdoor fungal spores.  Rain will temporarily lower outdoor mold spore count, but counts rise rapidly when the rainy period ends.  The most important indoor molds are Aspergillus and Penicillium.  Dark, humid and poorly ventilated basements are ideal sites for mold growth.  The next most common sites of mold growth are the bathroom and the kitchen.  Outdoor MicrosoftMold Control 1. Use air conditioning and keep windows closed 2. Avoid  exposure to decaying vegetation. 3. Avoid leaf raking. 4. Avoid grain handling. 5. Consider wearing a face mask if working in moldy areas.  Indoor Mold Control 1. Maintain humidity below 50%. 2. Clean washable surfaces with 5% bleach solution. 3. Remove sources e.g. Contaminated carpets.   Control of Cockroach Allergen  Cockroach allergen has been identified as an important cause of acute attacks of asthma, especially in urban settings.  There are fifty-five species of cockroach that exist in the [place], however only three, the [place], [place] species produce allergen that can affect patients with Asthma.  Allergens can be obtained from fecal particles, egg casings and secretions from cockroaches.    1. Remove food  sources. 2. Reduce access to water. 3. Seal access and entry points. 4. Spray runways with 0.5-1% Diazinon or Chlorpyrifos 5. Blow boric acid power under stoves and refrigerator. 6. Place bait stations (hydramethylnon) at feeding sites.  Oral Allergy Syndrome (OAS)  Oral Allergy Syndrome or OAS is an allergic reaction to certain (usually fresh) fruits, nuts, and vegetables. The allergy is not actually an allergy to food but a syndrome that develops in pollen allergy sufferers. The immune system mistakes the food proteins for the pollen proteins and causes an allergic reaction. For instance, an allergy to ragweed is associated with OAS reactions to banana, watermelon, cantaloupe, honeydew, zucchini, and cucumber. This does not mean that all sufferers of an allergy to ragweed will experience adverse effects from all or even any of these foods. Reactions may begin with one type of food and with reactions to others developing later. However, reaction to one or more foods in any given category does not necessarily mean a person is allergic to all foods in that group. OAS sufferers may have a number of reactions that usually occur very rapidly, within minutes of eating a trigger food. The most common reaction is an itching or burning sensation in the lips, mouth, and/or pharynx. Sometimes other reactions can be triggered in the eyes, nose, and skin. The most severe reactions can result in asthma problems or anaphylaxis.  If a sufferer is able to swallow the food, there is a good chance that there will be a reaction later in the gastrointestinal tract. Vomiting, diarrhea, severe indigestion, or cramps may occur.  Treatment: An OAS sufferer should avoid foods to which they are allergic. Peeling or cooking the food has shown to reduce symptoms in the throat and mouth, but may not relieve symptoms in the gastrointestinal tract. Antihistamines may also relieve the symptoms of the allergy. Persons with severe  reactions may consider carrying injectable epinephrine should systemic symptoms occur. Allergy immunotherapy to the pollens has improved or cured OAS in many patients, though this has not been consistent for all patients. Laban Emperor pollen: almonds, apples, celery, cherries, hazel nuts, peaches, pears, parsley, strawberry, raspberry . Birch pollen: almonds, apples, apricots, avocados, bananas, carrots, celery, cherries, chicory, coriander, fennel, fig, hazel nuts, kiwifruit, nectarines, parsley, parsnips, peaches, pears, peppers, plums, potatoes, prunes, soy, strawberries, wheat; Potential: walnuts . Grass pollen: fig, melons, tomatoes, oranges . Mugwort pollen : carrots, celery, coriander, fennel, parsley, peppers, sunflower . Ragweed pollen : banana, cantaloupe, cucumber, green pepper, paprika, sunflower seeds/oil, honeydew, watermelon, zucchini, echinacea, artichoke, dandelions, honey (if bees pollinate from wild flowers), hibiscus or chamomile tea . Possible cross-reactions (to any of the above): berries (strawberries, blueberries, raspberries, etc), citrus (oranges, lemons, etc), grapes, mango, figs, peanut, pineapple, pomegranates, watermelon

## 2018-04-04 NOTE — Assessment & Plan Note (Signed)
   Treatment plan as outlined above for allergic rhinitis.  A prescription has been provided for Pataday, one drop per eye daily as needed.  I have also recommended eye lubricant drops (i.e., Natural Tears) as needed. 

## 2018-04-06 ENCOUNTER — Telehealth: Payer: Self-pay | Admitting: *Deleted

## 2018-04-06 DIAGNOSIS — Z1231 Encounter for screening mammogram for malignant neoplasm of breast: Secondary | ICD-10-CM | POA: Diagnosis not present

## 2018-04-06 MED ORDER — OLOPATADINE HCL 0.7 % OP SOLN: 1.0000 [drp] | Freq: Every day | OPHTHALMIC | 5 refills | Status: DC

## 2018-04-06 NOTE — Telephone Encounter (Signed)
Received PA for Olopatadine 0.2. patient must try 2 of the following olopatadine 0.1, olopatadine 0.7, azelastine or epinastine. Dr Nunzio Cobbs please advise

## 2018-04-06 NOTE — Telephone Encounter (Signed)
Olopatadine 0.7%, 1 drop daily as needed. Thanks.

## 2018-04-06 NOTE — Telephone Encounter (Signed)
Sent in new script  

## 2018-04-08 LAB — ALPHA-GAL PANEL
Alpha Gal IgE*: <0.1 kU/L (ref <0.10)
Beef (Bos spp) IgE: <0.1 kU/L (ref <0.35)
Class Interpretation: 0
Class Interpretation: 0
Class Interpretation: 0
Lamb/Mutton (Ovis spp) IgE: <0.1 kU/L (ref <0.35)
Pork (Sus spp) IgE: <0.1 kU/L (ref <0.35)

## 2018-04-08 LAB — ALLERGEN PROFILE, FOOD-FISH
Allergen Mackerel IgE: <0.1 kU/L
Allergen Salmon IgE: <0.1 kU/L
Allergen Trout IgE: <0.1 kU/L
Allergen Walley Pike IgE: <0.1 kU/L
Codfish IgE: <0.1 kU/L
Halibut IgE: <0.1 kU/L
Tuna: <0.1 kU/L

## 2018-04-08 LAB — ALLERGEN PROFILE, SHELLFISH
Clam IgE: <0.1 kU/L
F023-IgE Crab: <0.1 kU/L
F080-IgE Lobster: <0.1 kU/L
F290-IgE Oyster: <0.1 kU/L
Scallop IgE: <0.1 kU/L
Shrimp IgE: 0.96 kU/L — AB

## 2018-04-08 LAB — TRYPTASE: Tryptase: 4 ug/L (ref 2.2–13.2)

## 2018-04-08 LAB — ALLERGEN, PINEAPPLE, F210: Pineapple IgE: <0.1 kU/L

## 2018-05-05 ENCOUNTER — Encounter: Payer: Self-pay | Admitting: Nurse Practitioner

## 2018-05-05 ENCOUNTER — Ambulatory Visit (INDEPENDENT_AMBULATORY_CARE_PROVIDER_SITE_OTHER): Payer: Medicare Other | Admitting: Nurse Practitioner

## 2018-05-05 ENCOUNTER — Ambulatory Visit (INDEPENDENT_AMBULATORY_CARE_PROVIDER_SITE_OTHER): Payer: Medicare Other

## 2018-05-05 VITALS — BP 142/76 | HR 74 | Temp 98.9°F | Ht 68.0 in | Wt 197.4 lb

## 2018-05-05 DIAGNOSIS — Z23 Encounter for immunization: Secondary | ICD-10-CM | POA: Diagnosis not present

## 2018-05-05 DIAGNOSIS — Z794 Long term (current) use of insulin: Secondary | ICD-10-CM

## 2018-05-05 DIAGNOSIS — E119 Type 2 diabetes mellitus without complications: Secondary | ICD-10-CM

## 2018-05-05 DIAGNOSIS — I10 Essential (primary) hypertension: Secondary | ICD-10-CM | POA: Diagnosis not present

## 2018-05-05 DIAGNOSIS — Z0000 Encounter for general adult medical examination without abnormal findings: Secondary | ICD-10-CM

## 2018-05-05 DIAGNOSIS — Z79899 Other long term (current) drug therapy: Secondary | ICD-10-CM | POA: Diagnosis not present

## 2018-05-05 DIAGNOSIS — Z1211 Encounter for screening for malignant neoplasm of colon: Secondary | ICD-10-CM | POA: Diagnosis not present

## 2018-05-05 LAB — POCT UA - MICROALBUMIN
Albumin/Creatinine Ratio, Urine, POC: <30
Creatinine, POC: 300 mg/dL
Microalbumin Ur, POC: 30 mg/L

## 2018-05-05 LAB — POCT URINALYSIS DIPSTICK
Bilirubin, UA: NEGATIVE
Blood, UA: NEGATIVE
Glucose, UA: NEGATIVE
Ketones, UA: NEGATIVE
Leukocytes, UA: NEGATIVE
Nitrite, UA: NEGATIVE
Protein, UA: NEGATIVE
SPEC GRAV UA: 1.025 (ref 1.010–1.025)
Urobilinogen, UA: 0.2 E.U./dL
pH, UA: 6 (ref 5.0–8.0)

## 2018-05-05 MED ORDER — PNEUMOCOCCAL 13-VAL CONJ VACC IM SUSP: 0.5000 mL | INTRAMUSCULAR | 0 refills | Status: AC

## 2018-05-05 NOTE — Progress Notes (Addendum)
Subjective:     Patient ID: Jacqueline Bell , female    DOB: [birth year] , 68 y.o.   MRN: [id number]   Chief Complaint  Patient presents with  . Diabetes    HPI  Here for HM  Diabetes  She has type 2 diabetes mellitus. Her disease course has been stable. Pertinent negatives for hypoglycemia include no confusion, dizziness or headaches. There are no diabetic associated symptoms. Pertinent negatives for diabetes include no chest pain. Symptoms are stable. Current diabetic treatment includes oral agent (dual therapy).  Hypertension  Pertinent negatives include no chest pain, headaches or palpitations.     Past Medical History:  Diagnosis Date  . Diabetes mellitus without complication (HCC)      History reviewed. No pertinent family history.   Current Outpatient Medications:  .  cholecalciferol (VITAMIN D) 1000 units tablet, Take 1,000 Units by mouth daily., Disp: , Rfl:  .  EPINEPHrine 0.3 mg/0.3 mL IJ SOAJ injection, , Disp: , Rfl:  .  fluticasone (FLONASE) 50 MCG/ACT nasal spray, Place 1 spray into both nostrils daily., Disp: 16 g, Rfl: 5 .  insulin aspart (NOVOLOG) 100 UNIT/ML injection, Inject 0-10 Units into the skin 3 (three) times daily with meals. Per SS: < 70 = 0; 70-90 = 4 units; 91-130 = 6 units; 131-150 = 7 units; 151-200 = 8 units; 201-250 = 9 units; 251-300 = 10 units; 301-350 = 11 units; 351-400 = 12 units; 401-450 = 13 units; > 450 = 14 units., Disp: , Rfl:  .  insulin glargine (LANTUS) 100 unit/mL SOPN, Inject 20-26 Units into the skin at bedtime., Disp: , Rfl:  .  levocetirizine (XYZAL) 5 MG tablet, Take 1 tablet (5 mg total) by mouth every evening., Disp: 90 tablet, Rfl: 1 .  meloxicam (MOBIC) 15 MG tablet, Take 1 mg by mouth as needed. , Disp: , Rfl:  .  Multiple Vitamin (MULTIVITAMIN) tablet, Take 1 tablet by mouth daily., Disp: , Rfl:  .  Olopatadine HCl (PAZEO) 0.7 % SOLN, Place 1 drop into both eyes daily., Disp: 1 Bottle, Rfl: 5 .  Omega-3 Fatty Acids (FISH  OIL PO), Take 2 capsules by mouth daily. , Disp: , Rfl:  .  POTASSIUM BICARBONATE PO, Take 1 tablet by mouth. Patient takes 2 tablets daily, Disp: , Rfl:  .  rosuvastatin (CRESTOR) 5 MG tablet, Take 1 tablet (5 mg total) by mouth daily., Disp: 90 tablet, Rfl: 0 .  Semaglutide,0.25 or 0.5MG /DOS, (OZEMPIC, 0.25 OR 0.5 MG/DOSE,) 2 MG/1.5ML SOPN, Inject 1 mg into the skin once a week. Inject 0.5mg  by subcutaenous route once weekly into the abdomen, thigh or upper arm rotating injection sites, Disp: 6 pen, Rfl: 1 .  telmisartan (MICARDIS) 20 MG tablet, Take 20 mg by mouth daily., Disp: , Rfl:  .  Vitamin D, Ergocalciferol, (DRISDOL) 1.25 MG (50000 UT) CAPS capsule, Take 1 capsule (50,000 Units total) by mouth every 7 (seven) days., Disp: 12 capsule, Rfl: 0   Allergies  Allergen Reactions  . Iodine Hives  . Shellfish Allergy Hives     Review of Systems  Respiratory: Negative.   Cardiovascular: Negative.  Negative for chest pain, palpitations and leg swelling.  Musculoskeletal: Negative.   Neurological: Negative for dizziness and headaches.  Psychiatric/Behavioral: Negative for confusion.     Today's Vitals   05/29/18 2000  BP: (!) 142/76  Pulse: 74  Temp: 98.9 F (37.2 C)  TempSrc: Oral  SpO2: 97%  Weight: 197 lb 6.4 oz (89.5 kg)  Height: 5\' 8"  (1.727 m)   Body mass index is 30.01 kg/m.   Objective:  Physical Exam Vitals signs reviewed.  Constitutional:      Appearance: Normal appearance.  Cardiovascular:     Rate and Rhythm: Normal rate and regular rhythm.     Pulses: Normal pulses.     Heart sounds: Normal heart sounds. No murmur.  Pulmonary:     Effort: Pulmonary effort is normal.     Breath sounds: Normal breath sounds.  Musculoskeletal: Normal range of motion.  Skin:    General: Skin is warm and dry.     Capillary Refill: Capillary refill takes less than 2 seconds.  Neurological:     General: No focal deficit present.     Mental Status: She is alert and oriented to  person, place, and time.  Psychiatric:        Mood and Affect: Mood normal.        Behavior: Behavior normal.        Thought Content: Thought content normal.        Judgment: Judgment normal.         Assessment And Plan:     1. Type 2 diabetes mellitus without complication, with long-term current use of insulin (HCC)  Chronic, poorly controlled  Tolerating ozempic well  Continue with current medications  Encouraged to limit intake of sugary foods and drinks  Encouraged to increase physical activity to 150 minutes per week - Lipid panel - Hemoglobin A1c  2. Essential hypertension . B/P is slightly elevated, . She has not been taking the telmisartan wanted to control without medications and naturally . Stressed importance to continue telmisartan for kidney protection and risk for spike in blood pressure and resultant of strokes or heart attacks  . CMP ordered to check renal function.  . The importance of regular exercise and dietary modification was stressed to the patient.  . Stressed importance of losing ten percent of her body weight to help with B/P control.  . The weight loss would help with decreasing cardiac and cancer risk as well.   - CMP14 + Anion Gap  3. Other long term (current) drug therapy  - TSH   Arnette Felts, FNP

## 2018-05-05 NOTE — Patient Instructions (Signed)
Jacqueline Bell , Thank you for taking time to come for your Medicare Wellness Visit. I appreciate your ongoing commitment to your health goals. Please review the following plan we discussed and let me know if I can assist you in the future.   Screening recommendations/referrals: Colonoscopy: referral sent Mammogram: 03/2018 Bone Density: 10/2012 Recommended yearly ophthalmology/optometry visit for glaucoma screening and checkup Recommended yearly dental visit for hygiene and checkup  Vaccinations: Influenza vaccine: 11/2017 Pneumococcal vaccine: 04/2017 Tdap vaccine: 10/2012 Shingles vaccine: discussed    Advanced directives: Advance directive discussed with you today. I have provided a copy for you to complete at home and have notarized. Once this is complete please bring a copy in to our office so we can scan it into your chart.   Conditions/risks identified: Obesity  Next appointment: 08/24/2018 at 11:45   Preventive Care 65 Years and Older, Female Preventive care refers to lifestyle choices and visits with your health care provider that can promote health and wellness. What does preventive care include?  A yearly physical exam. This is also called an annual well check.  Dental exams once or twice a year.  Routine eye exams. Ask your health care provider how often you should have your eyes checked.  Personal lifestyle choices, including:  Daily care of your teeth and gums.  Regular physical activity.  Eating a healthy diet.  Avoiding tobacco and drug use.  Limiting alcohol use.  Practicing safe sex.  Taking low-dose aspirin every day.  Taking vitamin and mineral supplements as recommended by your health care provider. What happens during an annual well check? The services and screenings done by your health care provider during your annual well check will depend on your age, overall health, lifestyle risk factors, and family history of disease. Counseling  Your health  care provider may ask you questions about your:  Alcohol use.  Tobacco use.  Drug use.  Emotional well-being.  Home and relationship well-being.  Sexual activity.  Eating habits.  History of falls.  Memory and ability to understand (cognition).  Work and work Astronomer.  Reproductive health. Screening  You may have the following tests or measurements:  Height, weight, and BMI.  Blood pressure.  Lipid and cholesterol levels. These may be checked every 5 years, or more frequently if you are over 16 years old.  Skin check.  Lung cancer screening. You may have this screening every year starting at age 82 if you have a 30-pack-year history of smoking and currently smoke or have quit within the past 15 years.  Fecal occult blood test (FOBT) of the stool. You may have this test every year starting at age 42.  Flexible sigmoidoscopy or colonoscopy. You may have a sigmoidoscopy every 5 years or a colonoscopy every 10 years starting at age 33.  Hepatitis C blood test.  Hepatitis B blood test.  Sexually transmitted disease (STD) testing.  Diabetes screening. This is done by checking your blood sugar (glucose) after you have not eaten for a while (fasting). You may have this done every 1-3 years.  Bone density scan. This is done to screen for osteoporosis. You may have this done starting at age 84.  Mammogram. This may be done every 1-2 years. Talk to your health care provider about how often you should have regular mammograms. Talk with your health care provider about your test results, treatment options, and if necessary, the need for more tests. Vaccines  Your health care provider may recommend certain vaccines, such as:  Influenza vaccine. This is recommended every year.  Tetanus, diphtheria, and acellular pertussis (Tdap, Td) vaccine. You may need a Td booster every 10 years.  Zoster vaccine. You may need this after age 19.  Pneumococcal 13-valent conjugate  (PCV13) vaccine. One dose is recommended after age 33.  Pneumococcal polysaccharide (PPSV23) vaccine. One dose is recommended after age 84. Talk to your health care provider about which screenings and vaccines you need and how often you need them. This information is not intended to replace advice given to you by your health care provider. Make sure you discuss any questions you have with your health care provider. Document Released: 03/22/2015 Document Revised: 11/13/2015 Document Reviewed: 12/25/2014 Elsevier Interactive Patient Education  2017 [employer] Prevention in the Home Falls can cause injuries. They can happen to people of all ages. There are many things you can do to make your home safe and to help prevent falls. What can I do on the outside of my home?  Regularly fix the edges of walkways and driveways and fix any cracks.  Remove anything that might make you trip as you walk through a door, such as a raised step or threshold.  Trim any bushes or trees on the path to your home.  Use bright outdoor lighting.  Clear any walking paths of anything that might make someone trip, such as rocks or tools.  Regularly check to see if handrails are loose or broken. Make sure that both sides of any steps have handrails.  Any raised decks and porches should have guardrails on the edges.  Have any leaves, snow, or ice cleared regularly.  Use sand or salt on walking paths during winter.  Clean up any spills in your garage right away. This includes oil or grease spills. What can I do in the bathroom?  Use night lights.  Install grab bars by the toilet and in the tub and shower. Do not use towel bars as grab bars.  Use non-skid mats or decals in the tub or shower.  If you need to sit down in the shower, use a plastic, non-slip stool.  Keep the floor dry. Clean up any water that spills on the floor as soon as it happens.  Remove soap buildup in the tub or shower  regularly.  Attach bath mats securely with double-sided non-slip rug tape.  Do not have throw rugs and other things on the floor that can make you trip. What can I do in the bedroom?  Use night lights.  Make sure that you have a light by your bed that is easy to reach.  Do not use any sheets or blankets that are too big for your bed. They should not hang down onto the floor.  Have a firm chair that has side arms. You can use this for support while you get dressed.  Do not have throw rugs and other things on the floor that can make you trip. What can I do in the kitchen?  Clean up any spills right away.  Avoid walking on wet floors.  Keep items that you use a lot in easy-to-reach places.  If you need to reach something above you, use a strong step stool that has a grab bar.  Keep electrical cords out of the way.  Do not use floor polish or wax that makes floors slippery. If you must use wax, use non-skid floor wax.  Do not have throw rugs and other things on the floor that  can make you trip. What can I do with my stairs?  Do not leave any items on the stairs.  Make sure that there are handrails on both sides of the stairs and use them. Fix handrails that are broken or loose. Make sure that handrails are as long as the stairways.  Check any carpeting to make sure that it is firmly attached to the stairs. Fix any carpet that is loose or worn.  Avoid having throw rugs at the top or bottom of the stairs. If you do have throw rugs, attach them to the floor with carpet tape.  Make sure that you have a light switch at the top of the stairs and the bottom of the stairs. If you do not have them, ask someone to add them for you. What else can I do to help prevent falls?  Wear shoes that:  Do not have high heels.  Have rubber bottoms.  Are comfortable and fit you well.  Are closed at the toe. Do not wear sandals.  If you use a stepladder:  Make sure that it is fully  opened. Do not climb a closed stepladder.  Make sure that both sides of the stepladder are locked into place.  Ask someone to hold it for you, if possible.  Clearly mark and make sure that you can see:  Any grab bars or handrails.  First and last steps.  Where the edge of each step is.  Use tools that help you move around (mobility aids) if they are needed. These include:  Canes.  Walkers.  Scooters.  Crutches.  Turn on the lights when you go into a dark area. Replace any light bulbs as soon as they burn out.  Set up your furniture so you have a clear path. Avoid moving your furniture around.  If any of your floors are uneven, fix them.  If there are any pets around you, be aware of where they are.  Review your medicines with your doctor. Some medicines can make you feel dizzy. This can increase your chance of falling. Ask your doctor what other things that you can do to help prevent falls. This information is not intended to replace advice given to you by your health care provider. Make sure you discuss any questions you have with your health care provider. Document Released: 12/20/2008 Document Revised: 08/01/2015 Document Reviewed: 03/30/2014 Elsevier Interactive Patient Education  2017 [employer].

## 2018-05-05 NOTE — Progress Notes (Signed)
Subjective:   Jacqueline Bell is a 68 y.o. female who presents for an Initial Medicare Annual Wellness Visit.  Review of Systems    n/a  Cardiac Risk Factors include: advanced age (>100men, >44 women);obesity (BMI >30kg/m2);dyslipidemia;hypertension     Objective:    Today's Vitals   05/05/18 1138  BP: (!) 142/76  Pulse: 74  Temp: 98.9 F (37.2 C)  TempSrc: Oral  SpO2: 97%  Weight: 197 lb 6.4 oz (89.5 kg)  Height:  (1.727 m)  PainSc: 0-No pain   Body mass index is 30.01 kg/m.  Advanced Directives 05/05/2018 10/05/2016  Does Patient Have a Medical Advance Directive? No No  Would patient like information on creating a medical advance directive? Yes (MAU/Ambulatory/Procedural Areas - Information given) -    Current Medications (verified) Outpatient Encounter Medications as of 05/05/2018  Medication Sig  . cholecalciferol (VITAMIN D) 1000 units tablet Take 1,000 Units by mouth daily.  . fexofenadine (ALLEGRA) 180 MG tablet Take 1 tablet (180 mg total) by mouth daily.  . fluticasone (FLONASE) 50 MCG/ACT nasal spray Place 1 spray into both nostrils daily.  . insulin aspart (NOVOLOG) 100 UNIT/ML injection Inject 0-10 Units into the skin 3 (three) times daily with meals. Per SS: < 70 = 0; 70-90 = 4 units; 91-130 = 6 units; 131-150 = 7 units; 151-200 = 8 units; 201-250 = 9 units; 251-300 = 10 units; 301-350 = 11 units; 351-400 = 12 units; 401-450 = 13 units; > 450 = 14 units.  . insulin glargine (LANTUS) 100 unit/mL SOPN Inject 20-26 Units into the skin at bedtime.  . meloxicam (MOBIC) 15 MG tablet   . Multiple Vitamin (MULTIVITAMIN) tablet Take 1 tablet by mouth daily.  . Olopatadine HCl (PAZEO) 0.7 % SOLN Place 1 drop into both eyes daily.  . Omega-3 Fatty Acids (FISH OIL PO) Take 1 capsule by mouth daily.  [name] POTASSIUM BICARBONATE PO Take 1 tablet by mouth. Patient takes 2 tablets daily  . Semaglutide,0.25 or 0.5MG /DOS, (OZEMPIC, 0.25 OR 0.5 MG/DOSE,) 2 MG/1.5ML SOPN Inject  1 mg into the skin once a week. Inject 0.5mg  by subcutaenous route once weekly into the abdomen, thigh or upper arm rotating injection sites  . EPINEPHrine 0.3 mg/0.3 mL IJ SOAJ injection   . pneumococcal 13-valent conjugate vaccine (PREVNAR 13) SUSP injection Inject 0.5 mLs into the muscle tomorrow at 10 am for 1 dose.  . telmisartan (MICARDIS) 20 MG tablet Take 20 mg by mouth daily.   No facility-administered encounter medications on file as of 05/05/2018.     Allergies (verified) Iodine and Shellfish allergy   History: Past Medical History:  Diagnosis Date  . Diabetes mellitus without complication (HCC)    History reviewed. No pertinent surgical history. History reviewed. No pertinent family history. Social History   Socioeconomic History  . Marital status: Married    Spouse name: Not on file  . Number of children: Not on file  . Years of education: Not on file  . Highest education level: Not on file  Occupational History  . Not on file  Social Needs  . Financial resource strain: Not hard at all  . Food insecurity:    Worry: Never true    Inability: Never true  . Transportation needs:    Medical: No    Non-medical: No  Tobacco Use  . Smoking status: Never Smoker  . Smokeless tobacco: Never Used  Substance and Sexual Activity  . Alcohol use: No  . Drug use:  No  . Sexual activity: Yes  Lifestyle  . Physical activity:    Days per week: 3 days    Minutes per session: 30 min  . Stress: Not at all  Relationships  . Social connections:    Talks on phone: Not on file    Gets together: Not on file    Attends religious service: Not on file    Active member of club or organization: Not on file    Attends meetings of clubs or organizations: Not on file    Relationship status: Not on file  Other Topics Concern  . Not on file  Social History Narrative  . Not on file    Tobacco Counseling Counseling given: Not Answered   Clinical Intake:  Pre-visit preparation  completed: Yes  Pain : No/denies pain Pain Score: 0-No pain     Nutritional Status: BMI > 30  Obese Nutritional Risks: None Diabetes: Yes CBG done?: No Did pt. bring in CBG monitor from home?: No  How often do you need to have someone help you when you read instructions, pamphlets, or other written materials from your doctor or pharmacy?: 1 - Never What is the last grade level you completed in school?: some college  Interpreter Needed?: No  Information entered by :: NAllen LPN   Activities of Daily Living In your present state of health, do you have any difficulty performing the following activities: 05/05/2018  Hearing? N  Vision? N  Difficulty concentrating or making decisions? N  Walking or climbing stairs? N  Dressing or bathing? N  Doing errands, shopping? N  Preparing Food and eating ? N  Using the Toilet? N  In the past six months, have you accidently leaked urine? N  Do you have problems with loss of bowel control? N  Managing your Medications? N  Managing your Finances? N  Housekeeping or managing your Housekeeping? N  Some recent data might be hidden     Immunizations and Health Maintenance Immunization History  Administered Date(s) Administered  . Influenza-Unspecified 11/18/2017   Health Maintenance Due  Topic Date Due  . FOOT EXAM  02/09/1962  . OPHTHALMOLOGY EXAM  02/09/1962  . COLONOSCOPY  02/09/2002  . PNA vac Low Risk Adult (1 of 2 - PCV13) 02/09/2017    Patient Care Team: Arnette Felts, FNP as PCP - General (General Practice) Sallye Lat, MD as Consulting Physician (Ophthalmology)  Indicate any recent Medical Services you may have received from other than [facility] providers in the past year (date may be approximate).     Assessment:   This is a routine wellness examination for Jacqueline Bell.  Hearing/Vision screen Vision Screening Comments: Annual eye exams Dr. Dione Booze  Dietary issues and exercise activities discussed: Current Exercise  Habits: Home exercise routine, Type of exercise: walking, Time (Minutes): 30, Frequency (Times/Week): 3, Weekly Exercise (Minutes/Week): 90, Intensity: Moderate, Exercise limited by: None identified  Goals    . Weight (lb) < 200 lb (90.7 kg) (pt-stated)     Continue to lose weight and get off of diabetic medication      Depression Screen PHQ 2/9 Scores 05/05/2018 02/24/2018  PHQ - 2 Score 0 0  PHQ- 9 Score 0 -    Fall Risk Fall Risk  05/05/2018 02/24/2018  Falls in the past year? 0 0  Risk for fall due to : Medication side effect -  Follow up Education provided;Falls prevention discussed -    Is the patient's home free of loose throw rugs in walkways,  pet beds, electrical cords, etc?   yes      Grab bars in the bathroom? no      Handrails on the stairs?   yes      Adequate lighting?   yes  Timed Get Up and Go Performed  n/a  Cognitive Function:     6CIT Screen 05/05/2018  What Year? 0 points  What month? 0 points  What time? 0 points  Count back from 20 0 points  Months in reverse 0 points  Repeat phrase 0 points  Total Score 0    Screening Tests Health Maintenance  Topic Date Due  . FOOT EXAM  02/09/1962  . OPHTHALMOLOGY EXAM  02/09/1962  . COLONOSCOPY  02/09/2002  . PNA vac Low Risk Adult (1 of 2 - PCV13) 02/09/2017  . HEMOGLOBIN A1C  08/26/2018  . MAMMOGRAM  04/06/2020  . TETANUS/TDAP  10/27/2022  . INFLUENZA VACCINE  Completed  . DEXA SCAN  Completed  . Hepatitis C Screening  Completed    Qualifies for Shingles Vaccine? yes  Cancer Screenings: Lung: Low Dose CT Chest recommended if Age 23-80 years, 30 pack-year currently smoking OR have quit w/in 15years. Patient does not qualify. Breast: Up to date on Mammogram? Yes   Up to date of Bone Density/Dexa? Yes Colorectal: recommended  Additional Screenings:  Hepatitis C Screening: 03/28/2012 <0.1     Plan:    Wants to lose weight and get off of diabetic medication  I have personally reviewed and noted  the following in the patient's chart:   . Medical and social history . Use of alcohol, tobacco or illicit drugs  . Current medications and supplements . Functional ability and status . Nutritional status . Physical activity . Advanced directives . List of other physicians . Hospitalizations, surgeries, and ER visits in previous 12 months . Vitals . Screenings to include cognitive, depression, and falls . Referrals and appointments  In addition, I have reviewed and discussed with patient certain preventive protocols, quality metrics, and best practice recommendations. A written personalized care plan for preventive services as well as general preventive health recommendations were provided to patient.     Barb Merino, LPN   [id number]/74/[id number]

## 2018-05-06 LAB — CMP14 + ANION GAP
ALBUMIN: 4.6 g/dL (ref 3.8–4.8)
ALT: 11 IU/L (ref 0–32)
AST: 20 IU/L (ref 0–40)
Albumin/Globulin Ratio: 1.6 (ref 1.2–2.2)
Alkaline Phosphatase: 50 IU/L (ref 39–117)
Anion Gap: 15 mmol/L (ref 10.0–18.0)
BUN/Creatinine Ratio: 10 — ABNORMAL LOW (ref 12–28)
BUN: 8 mg/dL (ref 8–27)
Bilirubin Total: 0.5 mg/dL (ref 0.0–1.2)
CO2: 23 mmol/L (ref 20–29)
Calcium: 9.1 mg/dL (ref 8.7–10.3)
Chloride: 101 mmol/L (ref 96–106)
Creatinine, Ser: 0.78 mg/dL (ref 0.57–1.00)
GFR calc Af Amer: 92 mL/min/{1.73_m2} (ref >59)
GFR, EST NON AFRICAN AMERICAN: 79 mL/min/{1.73_m2} (ref >59)
Globulin, Total: 2.8 g/dL (ref 1.5–4.5)
Glucose: 106 mg/dL — ABNORMAL HIGH (ref 65–99)
Potassium: 4.4 mmol/L (ref 3.5–5.2)
Sodium: 139 mmol/L (ref 134–144)
Total Protein: 7.4 g/dL (ref 6.0–8.5)

## 2018-05-06 LAB — LIPID PANEL
Chol/HDL Ratio: 4.6 ratio — ABNORMAL HIGH (ref 0.0–4.4)
Cholesterol, Total: 227 mg/dL — ABNORMAL HIGH (ref 100–199)
HDL: 49 mg/dL (ref >39)
LDL Calculated: 161 mg/dL — ABNORMAL HIGH (ref 0–99)
Triglycerides: 84 mg/dL (ref 0–149)
VLDL Cholesterol Cal: 17 mg/dL (ref 5–40)

## 2018-05-06 LAB — HEMOGLOBIN A1C
Est. average glucose Bld gHb Est-mCnc: 174 mg/dL
Hgb A1c MFr Bld: 7.7 % — ABNORMAL HIGH (ref 4.8–5.6)

## 2018-05-06 LAB — TSH: TSH: 0.482 u[IU]/mL (ref 0.450–4.500)

## 2018-05-09 ENCOUNTER — Other Ambulatory Visit: Payer: Self-pay | Admitting: Nurse Practitioner

## 2018-05-09 DIAGNOSIS — E119 Type 2 diabetes mellitus without complications: Secondary | ICD-10-CM

## 2018-05-09 DIAGNOSIS — Z794 Long term (current) use of insulin: Principal | ICD-10-CM

## 2018-05-13 ENCOUNTER — Ambulatory Visit: Payer: Self-pay

## 2018-05-13 DIAGNOSIS — G4700 Insomnia, unspecified: Secondary | ICD-10-CM

## 2018-05-13 DIAGNOSIS — I10 Essential (primary) hypertension: Secondary | ICD-10-CM

## 2018-05-13 DIAGNOSIS — Z794 Long term (current) use of insulin: Principal | ICD-10-CM

## 2018-05-13 DIAGNOSIS — E119 Type 2 diabetes mellitus without complications: Secondary | ICD-10-CM

## 2018-05-13 NOTE — Chronic Care Management (AMB) (Signed)
  Care Management Note   Jacqueline Bell is a 68 y.o. year old female who is a primary care patient of [name], [place]. The CM team was consult for assistance with chronic disease management and care coordination.   Review of patient status, including review of consultants reports, relevant laboratory and other test results, and collaboration with appropriate care team members and the patient's provider was performed as part of comprehensive patient evaluation and provision of chronic care management services.   I reached out to [name] by phone today.   Jacqueline Bell was given information about Chronic Care Management services today including:  1. CCM service includes personalized support from designated clinical staff supervised by her physician, including individualized plan of care and coordination with other care providers 2. 24/7 contact phone numbers for assistance for urgent and routine care needs. 3. Service will only be billed when office clinical staff spend 20 minutes or more in a month to coordinate care. 4. Only one practitioner may furnish and bill the service in a calendar month. 5. The patient may stop CCM services at any time (effective at the end of the month) by phone call to the office staff. 6. The patient will be responsible for cost sharing (co-pay) of up to 20% of the service fee (after annual deductible is met).  Patient agreed to services and verbal consent obtained.    Follow Up Plan: FACE TO FACE CCM INITIAL VISIT IS SCHEDULED FOR 05/19/18 '@11'$ :15 AM.    Barb Merino, RN,CCM Care Management Coordinator [facility]  Direct Phone: [phone]

## 2018-05-13 NOTE — Patient Instructions (Signed)
Visit Information  Ms. Lehmkuhl was given information about Chronic Care Management services today including:  1. CCM service includes personalized support from designated clinical staff supervised by her physician, including individualized plan of care and coordination with other care providers 2. 24/7 contact phone numbers for assistance for urgent and routine care needs. 3. Service will only be billed when office clinical staff spend 20 minutes or more in a month to coordinate care. 4. Only one practitioner may furnish and bill the service in a calendar month. 5. The patient may stop CCM services at any time (effective at the end of the month) by phone call to the office staff. 6. The patient will be responsible for cost sharing (co-pay) of up to 20% of the service fee (after annual deductible is met).  Patient agreed to services and verbal consent obtained.   The patient verbalized understanding of instructions provided today and declined a print copy of patient instruction materials.   Face to Face appointment with CCM team member scheduled for: 05/19/18 '@11' :15AM  Barb Merino, RN,CCM Care Management Coordinator [facility]  Direct Phone: [phone]

## 2018-05-19 ENCOUNTER — Other Ambulatory Visit: Payer: Self-pay

## 2018-05-19 ENCOUNTER — Ambulatory Visit: Payer: Self-pay

## 2018-05-19 ENCOUNTER — Ambulatory Visit (INDEPENDENT_AMBULATORY_CARE_PROVIDER_SITE_OTHER): Payer: Medicare Other

## 2018-05-19 DIAGNOSIS — Z794 Long term (current) use of insulin: Secondary | ICD-10-CM

## 2018-05-19 DIAGNOSIS — E559 Vitamin D deficiency, unspecified: Secondary | ICD-10-CM

## 2018-05-19 DIAGNOSIS — E119 Type 2 diabetes mellitus without complications: Secondary | ICD-10-CM | POA: Diagnosis not present

## 2018-05-19 DIAGNOSIS — G4700 Insomnia, unspecified: Secondary | ICD-10-CM

## 2018-05-19 DIAGNOSIS — I10 Essential (primary) hypertension: Secondary | ICD-10-CM

## 2018-05-19 NOTE — Chronic Care Management (AMB) (Signed)
Chronic Care Management   Initial Visit Note  05/19/2018 Name: Jacqueline Bell MRN: [id number] DOB: [birth year]  Referred by: [name], FNP Reason for referral : Chronic Care Management (INITIAL CCM FACE TO FACE VISIT )   Jacqueline Bell is a 68 y.o. year old female who is a primary care patient of [name], [place]. The CCM team was consulted for assistance with chronic disease management and care coordination needs.   Review of patient status, including review of consultants reports, relevant laboratory and other test results, and collaboration with appropriate care team members and the patient's provider was performed as part of comprehensive patient evaluation and provision of chronic care management services.    The CCM team met with Jacqueline Bell today for her initial CCM Face to Face visit.   Objective:  Lab Results  Component Value Date   HGBA1C 7.7 (H) 05/05/2018   HGBA1C 7.8 (H) 02/24/2018   Lab Results  Component Value Date   MICROALBUR 30 05/05/2018   LDLCALC 161 (H) 05/05/2018   CREATININE 0.78 05/05/2018     BP Readings from Last 3 Encounters:  05/05/18 (!) 142/76  04/04/18 (!) 150/70  02/24/18 (!) 142/82    Goals Addressed    Patient Stated   . "I don't know if I am suppose to be taking Vitamin D now" (pt-stated)       Current Barriers:  Jacqueline Bell Knowledge Deficit related to medication management for Vitamin D deficiency  Nurse Case Manager Clinical Goal(s):  Jacqueline Bell Over the next 30 days, patient will verbalize understanding of plan for Vitamin D deficiency  Interventions:   Face to Face CCM visit completed with patient . Evaluation of current treatment plan related to low Vitamin D level and patient's adherence to plan as established by provider. . Provided education to patient re: potential cause of low Vitamin D; standard treatment options; potential complications if untreated  . Collaboration with provider [name], FNP re: order for lab draw for  Vitamin D level (approved) . Provided RNCM contact#; [facility] 24/7 nurse advice line phone # . Scheduled a telephone follow call with patient  Patient Self Care Activities:   Verbalizes understanding of the education/information provided today  . Self administers medications as prescribed . Attends all scheduled provider appointments . Calls provider office for new concerns or questions   Plan:   RNCM will follow up with patient in 2-3 weeks  Initial goal documentation    . "I want to get my A1c down" (pt-stated)       Current Barriers:  Jacqueline Bell Knowledge Deficits related to Diabetes and Self Health Management  Nurse Case Manager Clinical Goal(s):  Jacqueline Bell Over the next 30 days, patient will demonstrate improved adherence to prescribed treatment plan for Diabetes as evidenced by patient will report improved adherence to following a Diabetic diet; exercise and taking all Diabetic medications exactly as prescribed  Interventions:  . Face to Face interview with patient  . Review of most recent laboratory data related to DM . Education provided about s/s of hypo & hyperglycemia . Discussion about emergency measures related to DM . Provided patient with documentation and education tool ([facility] blue notebook)  Verbal and written education provided to patient related to diabetic meal planning, using the plate method, discussed portion sizes, carb counting and knowing how many carbs to each with each meal/snack  Provided "Living Well with Diabetes" booklet  Provided "Counting Carbs & Meal Planning" booklet  Provided written instructions for Diabetic education and  support class offered by [facility] (patient will consider attending)  Assessed knowledge/understanding and comfort level with self-monitoring CBG's  Instructed on best time to monitor glucose levels, preprandial and or if feeling symptomatic  Assessed for knowledge and understanding of importance of daily foot inspection (pt inspects every  night)  Assessed for adherence to annual eye exam   Provided RNCM contact#; [facility] 24/7 nurse advice line phone #  Scheduled a telephone follow up with patient  Patient Self Care Activities:   Verbalizes understanding of the education/information provided today  . Self administers medications as prescribed . Attends all scheduled provider appointments . Calls provider office for new concerns or questions   Plan:    RNCM will follow up with patient in 2-3 weeks by telephone  Initial goal documentation    . "I will try the Crestor again for my Cholesterol"  (pt-stated)       Current Barriers:  Jacqueline Bell Knowledge Deficits related to Hyperlipidemia  . Non-adherence to prescribed pharmacological recommendations for Hyperlipidemia  Nurse Case Manager Clinical Goal(s):  Jacqueline Bell Over the next 30 days, patient will report adherence to her Diabetic, low fat, low Sodium diet; she will resume taking a Hyperlipidemic medication.   Interventions:   Face to Face visit completed with patient  . Evaluation of current treatment plan related to Hyperlipidemia and patient's adherence to plan as established by provider. . Provided education to patient re: potential cause of Hyperlipidemia, standard treatment options, potential complications if left untreated,  importance of adhering to a low fat, heart healthy diet . Completed medication review with patient; Assessed for medication compliance . Collaboration with provider [name], FNP via in basket message re: Rx for Crestor (samples previously given) . Provided patient with RNCM contact #; provided [facility] 24/7 nurse advice line phone # . Scheduled a telephone CCM follow up with patient  Patient Self Care Activities:   Verbalizes understanding of the education/information provided  . Self administers medications as prescribed but is not adhering to taking Cholesterol medication . Attends all scheduled provider appointments . Calls provider office for new  concerns or questions  Plan:   RNCM will follow up with patient in 2-3 weeks by telephone  Initial goal documentation    . "I'll purchase a BP cuff so I can start checking my BP" (pt-stated)       Current Barriers:  Jacqueline Bell Knowledge Deficits related to Hypertension  Nurse Case Manager Clinical Goal(s):  Over the next 30 days, patient will verbalize basic understanding of Hypertension  disease process and self health management plan as evidenced by patient will take all BP meds exactly as prescribed without missed doses; she will report improved adherence to following a low Sodium diet.  Interventions:   Face to Face visit completed with patient  . Evaluation of current treatment plan related to Hypertension and patient's adherence to plan as established by provider. . Provided education to patient re: potential cause of Hypertension, signs and symptoms, standard treatment options, discussed ADA recommended target BP 130/80, importance of lowering Sodium intake; potential complications with uncontrolled HTN . Completed medication review with patient . Assessed for medication compliance . Provided patient with written educational materials including Hypertension booklet, and low Sodium sheet . Assessed for patient's ability and comfort level to self monitor her BP (pt will purchase a BP cuff) . Instructed patient to Self monitor her BP 1-2 times weekly and report abnormal readings . Provided patient with RNCM contact #; provided [facility] 24/7 nurse advice  line phone # . Scheduled a telephone CCM follow up with patient  Patient Self Care Activities:  Verbalizes understanding of the education/information provided today  . Self administers medications as prescribed . Attends all scheduled provider appointments . Calls provider office for new concerns or questions   Plan: RNCM will follow up with patient in 2-3 weeks  Initial goal documentation     The CM team will reach out to the patient  again over the next 7-14 days days.   Barb Merino, RN,CCM Care Management Coordinator [facility] [facility]  Direct Phone: [phone]

## 2018-05-19 NOTE — Chronic Care Management (AMB) (Signed)
  Chronic Care Management    Clinical Social Work General Note  05/19/2018 Name: Jacqueline Bell MRN: [id number] DOB: 05-30-1950  Jacqueline Bell is a 68 y.o. year old female who is a primary care patient of [name], [place]. The CCM was consulted to assist the patient with diabetes education.  Ms. Kubota was given information about Chronic Care Management services today including:  1. CCM service includes personalized support from designated clinical staff supervised by her physician, including individualized plan of care and coordination with other care providers 2. 24/7 contact phone numbers for assistance for urgent and routine care needs. 3. Service will only be billed when office clinical staff spend 20 minutes or more in a month to coordinate care. 4. Only one practitioner may furnish and bill the service in a calendar month. 5. The patient may stop CCM services at any time (effective at the end of the month) by phone call to the office staff. 6. The patient will be responsible for cost sharing (co-pay) of up to 20% of the service fee (after annual deductible is met).  Patient agreed to services and verbal consent obtained.   Review of patient status, including review of consultants reports, relevant laboratory and other test results, and collaboration with appropriate care team members and the patient's provider was performed as part of comprehensive patient evaluation and provision of chronic care management services.    SDOH (Social Determinants of Health) screening performed today.  The patient does not indicate any barriers or concerns related to social determinants of health. The patient denies SW needs at this time.   Follow Up Plan: SW provided the patient with contact information should assistance be needed in the future.        Daneen Schick, BSW, CDP [facility] Care Management Social Worker [phone]  Total time spent performing care coordination and/or care  management activities with the patient by phone or face to face = 10 minutes.

## 2018-05-19 NOTE — Patient Instructions (Signed)
Social Worker Visit Information    Materials provided: Verbal education about how to contact SW if needs arise provided face to face  Jacqueline Bell was given information about Chronic Care Management services today including:  1. CCM service includes personalized support from designated clinical staff supervised by her physician, including individualized plan of care and coordination with other care providers 2. 24/7 contact phone numbers for assistance for urgent and routine care needs. 3. Service will only be billed when office clinical staff spend 20 minutes or more in a month to coordinate care. 4. Only one practitioner may furnish and bill the service in a calendar month. 5. The patient may stop CCM services at any time (effective at the end of the month) by phone call to the office staff. 6. The patient will be responsible for cost sharing (co-pay) of up to 20% of the service fee (after annual deductible is met).  Patient agreed to services and verbal consent obtained.   The patient verbalized understanding of instructions provided today and declined a print copy of patient instruction materials.   Follow up plan: Patient will outreach SW if future social work needs arise.  Daneen Schick, BSW, CDP [facility] Care Management Social Worker [phone]

## 2018-05-20 ENCOUNTER — Other Ambulatory Visit: Payer: Self-pay | Admitting: Nurse Practitioner

## 2018-05-20 LAB — VITAMIN D 25 HYDROXY (VIT D DEFICIENCY, FRACTURES): Vit D, 25-Hydroxy: 21.8 ng/mL — ABNORMAL LOW (ref 30.0–100.0)

## 2018-05-20 MED ORDER — VITAMIN D (ERGOCALCIFEROL) 1.25 MG (50000 UNIT) PO CAPS: 50000.0000 [IU] | ORAL_CAPSULE | ORAL | 0 refills | Status: DC

## 2018-05-20 MED ORDER — LEVOCETIRIZINE DIHYDROCHLORIDE 5 MG PO TABS: 5.0000 mg | ORAL_TABLET | Freq: Every evening | ORAL | 1 refills | Status: DC

## 2018-05-20 NOTE — Patient Instructions (Signed)
Visit Information  Goals Addressed    Patient Stated   . "I don't know if I am suppose to be taking Vitamin D now" (pt-stated)       Current Barriers:  [name] Knowledge Deficit related to medication management for Vitamin D deficiency  Nurse Case Manager Clinical Goal(s):  [name] Over the next 30 days, patient will verbalize understanding of plan for Vitamin D deficiency  Interventions:   Face to Face CCM visit completed with patient . Evaluation of current treatment plan related to low Vitamin D level and patient's adherence to plan as established by provider. . Provided education to patient re: potential cause of low Vitamin D; standard treatment options; potential complications if untreated  . Collaboration with provider Arnette Felts, FNP re: order for lab draw for Vitamin D level (approved) . Provided RNCM contact#; [facility] 24/7 nurse advice line phone # . Scheduled a telephone follow call with patient  Patient Self Care Activities:   Verbalizes understanding of the education/information provided today  . Self administers medications as prescribed . Attends all scheduled provider appointments . Calls provider office for new concerns or questions   Plan:   RNCM will follow up with patient in 2-3 weeks  Initial goal documentation    . "I want to get my A1c down" (pt-stated)       Current Barriers:  [name] Knowledge Deficits related to Diabetes and Self Health Management  Nurse Case Manager Clinical Goal(s):  [name] Over the next 30 days, patient will demonstrate improved adherence to prescribed treatment plan for Diabetes as evidenced by patient will report improved adherence to following a Diabetic diet; exercise and taking all Diabetic medications exactly as prescribed  Interventions:  . Face to Face interview with patient  . Review of most recent laboratory data related to DM . Education provided about s/s of hypo & hyperglycemia . Discussion about emergency measures related to  DM . Provided patient with documentation and education tool ([facility] blue notebook)  Verbal and written education provided to patient related to diabetic meal planning, using the plate method, discussed portion sizes, carb counting and knowing how many carbs to each with each meal/snack  Provided "Living Well with Diabetes" booklet  Provided "Counting Carbs & Meal Planning" booklet  Provided written instructions for Diabetic education and support class offered by [facility] (patient will consider attending)  Assessed knowledge/understanding and comfort level with self-monitoring CBG's  Instructed on best time to monitor glucose levels, preprandial and or if feeling symptomatic  Assessed for knowledge and understanding of importance of daily foot inspection (pt inspects every night)  Assessed for adherence to annual eye exam   Provided RNCM contact#; [facility] 24/7 nurse advice line phone #  Scheduled a telephone follow up with patient  Patient Self Care Activities:   Verbalizes understanding of the education/information provided today  . Self administers medications as prescribed . Attends all scheduled provider appointments . Calls provider office for new concerns or questions   Plan:    RNCM will follow up with patient in 2-3 weeks by telephone  Initial goal documentation    . "I will try the Crestor again for my Cholesterol"  (pt-stated)       Current Barriers:  [name] Knowledge Deficits related to Hyperlipidemia  . Non-adherence to prescribed pharmacological recommendations for Hyperlipidemia  Nurse Case Manager Clinical Goal(s):  [name] Over the next 30 days, patient will report adherence to her Diabetic, low fat, low Sodium diet; she will resume taking a Hyperlipidemic  medication.   Interventions:   Face to Face visit completed with patient  . Evaluation of current treatment plan related to Hyperlipidemia and patient's adherence to plan as established by provider. . Provided  education to patient re: potential cause of Hyperlipidemia, standard treatment options, potential complications if left untreated,  importance of adhering to a low fat, heart healthy diet . Completed medication review with patient; Assessed for medication compliance . Collaboration with provider Arnette Felts, FNP via in basket message re: Rx for Crestor (samples previously given) . Provided patient with RNCM contact #; provided [facility] 24/7 nurse advice line phone # . Scheduled a telephone CCM follow up with patient  Patient Self Care Activities:   Verbalizes understanding of the education/information provided  . Self administers medications as prescribed but is not adhering to taking Cholesterol medication . Attends all scheduled provider appointments . Calls provider office for new concerns or questions  Plan:   RNCM will follow up with patient in 2-3 weeks by telephone  Initial goal documentation    . "I'll purchase a BP cuff so I can start checking my BP" (pt-stated)       Current Barriers:  [name] Knowledge Deficits related to Hypertension  Nurse Case Manager Clinical Goal(s):  Over the next 30 days, patient will verbalize basic understanding of Hypertension  disease process and self health management plan as evidenced by patient will take all BP meds exactly as prescribed without missed doses; she will report improved adherence to following a low Sodium diet.  Interventions:   Face to Face visit completed with patient  . Evaluation of current treatment plan related to Hypertension and patient's adherence to plan as established by provider. . Provided education to patient re: potential cause of Hypertension, signs and symptoms, standard treatment options, discussed ADA recommended target BP 130/80, importance of lowering Sodium intake; potential complications with uncontrolled HTN . Completed medication review with patient . Assessed for medication compliance . Provided patient with  written educational materials including Hypertension booklet, and low Sodium sheet . Assessed for patient's ability and comfort level to self monitor her BP (pt will purchase a BP cuff) . Instructed patient to Self monitor her BP 1-2 times weekly and report abnormal readings . Provided patient with RNCM contact #; provided [facility] 24/7 nurse advice line phone # . Scheduled a telephone CCM follow up with patient  Patient Self Care Activities:  Verbalizes understanding of the education/information provided today  . Self administers medications as prescribed . Attends all scheduled provider appointments . Calls provider office for new concerns or questions   Plan: RNCM will follow up with patient in 2-3 weeks   Initial goal documentation     The patient verbalized understanding of instructions provided today and declined a print copy of patient instruction materials.   The CM team will reach out to the patient again over the next 2-3 weeks.   Delsa Sale, RN,CCM Care Management Coordinator [facility] Care Management/[facility]  Direct Phone: [phone]

## 2018-05-23 ENCOUNTER — Other Ambulatory Visit: Payer: Self-pay

## 2018-05-23 DIAGNOSIS — E559 Vitamin D deficiency, unspecified: Secondary | ICD-10-CM

## 2018-05-23 MED ORDER — VITAMIN D (ERGOCALCIFEROL) 1.25 MG (50000 UNIT) PO CAPS: 50000.0000 [IU] | ORAL_CAPSULE | ORAL | 0 refills | Status: DC

## 2018-05-25 ENCOUNTER — Encounter: Payer: Self-pay | Admitting: Nurse Practitioner

## 2018-05-29 ENCOUNTER — Encounter: Payer: Self-pay | Admitting: Nurse Practitioner

## 2018-06-07 ENCOUNTER — Ambulatory Visit: Payer: Self-pay

## 2018-06-07 ENCOUNTER — Telehealth: Payer: Self-pay

## 2018-06-07 DIAGNOSIS — Z794 Long term (current) use of insulin: Principal | ICD-10-CM

## 2018-06-07 DIAGNOSIS — I10 Essential (primary) hypertension: Secondary | ICD-10-CM

## 2018-06-07 DIAGNOSIS — E119 Type 2 diabetes mellitus without complications: Secondary | ICD-10-CM

## 2018-06-07 NOTE — Chronic Care Management (AMB) (Signed)
    Chronic Care Management   Outreach Note  06/07/2018 Name: Jacqueline Bell MRN: [id number] DOB: 27-Oct-1950  Referred by: Arnette Felts, FNP Reason for referral : Chronic Care Management (TELEPHONE CCM FOLLOW UP )   An unsuccessful telephone outreach was attempted today. The patient was referred to the case management team by Arnette Felts FNP for assistance with type 2 diabetes mellitus without complication, with long-term current use of insulin.   Follow Up Plan: The CM team will reach out to the patient again over the next 5-7 days.    Delsa Sale, RN,CCM Care Management Coordinator [facility] Care Management/[facility]  Direct Phone: [phone]

## 2018-06-07 NOTE — Progress Notes (Signed)
This encounter was created in error - please disregard.

## 2018-06-14 ENCOUNTER — Other Ambulatory Visit: Payer: Self-pay | Admitting: Nurse Practitioner

## 2018-06-14 ENCOUNTER — Telehealth: Payer: Self-pay

## 2018-06-14 DIAGNOSIS — E119 Type 2 diabetes mellitus without complications: Secondary | ICD-10-CM

## 2018-06-14 DIAGNOSIS — Z1383 Encounter for screening for respiratory disorder NEC: Secondary | ICD-10-CM

## 2018-06-14 DIAGNOSIS — Z794 Long term (current) use of insulin: Principal | ICD-10-CM

## 2018-06-14 MED ORDER — PNEUMOCOCCAL 13-VAL CONJ VACC IM SUSP: 0.5000 mL | INTRAMUSCULAR | 0 refills | Status: AC

## 2018-06-14 MED ORDER — ROSUVASTATIN CALCIUM 5 MG PO TABS: 5.0000 mg | ORAL_TABLET | Freq: Every day | ORAL | 0 refills | Status: DC

## 2018-06-24 ENCOUNTER — Telehealth: Payer: Self-pay

## 2018-06-29 ENCOUNTER — Ambulatory Visit: Payer: Self-pay

## 2018-06-29 ENCOUNTER — Telehealth: Payer: Self-pay

## 2018-06-29 DIAGNOSIS — I10 Essential (primary) hypertension: Secondary | ICD-10-CM

## 2018-06-29 DIAGNOSIS — E119 Type 2 diabetes mellitus without complications: Secondary | ICD-10-CM

## 2018-06-29 DIAGNOSIS — Z794 Long term (current) use of insulin: Principal | ICD-10-CM

## 2018-06-29 NOTE — Chronic Care Management (AMB) (Signed)
  Chronic Care Management   Outreach Note  06/29/2018 Name: Jacqueline Bell MRN: [id number] DOB: 08/31/1950  Referred by: Arnette Felts, FNP Reason for referral : Chronic Care Management (CCM TELEPHONE FOLLOW UP )   An unsuccessful telephone outreach was attempted today. The patient was referred to the case management team by Arnette Felts FNP for assistance with Diabetes disease management and Essential Hypertension.  Follow Up Plan: Telephone follow up appointment with CCM team member scheduled for: 7-10 days    Delsa Sale, [facility] Care Management Coordinator [facility] Care Management/[facility]  Direct Phone: [phone]

## 2018-07-07 ENCOUNTER — Telehealth: Payer: Self-pay

## 2018-07-08 ENCOUNTER — Other Ambulatory Visit: Payer: Self-pay | Admitting: Nurse Practitioner

## 2018-07-11 NOTE — Telephone Encounter (Signed)
meloxicam refill

## 2018-07-19 ENCOUNTER — Telehealth: Payer: Self-pay

## 2018-07-20 ENCOUNTER — Other Ambulatory Visit: Payer: Self-pay | Admitting: Nurse Practitioner

## 2018-07-20 MED ORDER — MELOXICAM 15 MG PO TABS: 15.0000 mg | ORAL_TABLET | ORAL | 0 refills | Status: DC | PRN

## 2018-07-20 NOTE — Telephone Encounter (Signed)
Meloxicam refill 

## 2018-07-21 ENCOUNTER — Telehealth: Payer: Self-pay

## 2018-07-21 NOTE — Telephone Encounter (Signed)
I called pt to notify her that she should not take the meloxicam more than 3 times a week. YRL,RMA

## 2018-07-27 ENCOUNTER — Telehealth: Payer: Self-pay

## 2018-08-04 ENCOUNTER — Ambulatory Visit: Payer: Medicare Other | Admitting: Nurse Practitioner

## 2018-08-12 ENCOUNTER — Ambulatory Visit: Payer: Self-pay

## 2018-08-12 ENCOUNTER — Other Ambulatory Visit: Payer: Self-pay

## 2018-08-12 ENCOUNTER — Telehealth: Payer: Medicare Other

## 2018-08-12 DIAGNOSIS — E119 Type 2 diabetes mellitus without complications: Secondary | ICD-10-CM

## 2018-08-12 DIAGNOSIS — I10 Essential (primary) hypertension: Secondary | ICD-10-CM

## 2018-08-12 DIAGNOSIS — Z794 Long term (current) use of insulin: Secondary | ICD-10-CM

## 2018-08-12 NOTE — Patient Instructions (Addendum)
Visit Information  Goals Addressed      Patient Stated   . COMPLETED: "I don't know if I am suppose to be taking Vitamin D now" (pt-stated)       Current Barriers:  [name] Knowledge Deficit related to medication management for Vitamin D deficiency  Nurse Case Manager Clinical Goal(s):  [name] Over the next 90 days, patient will verbalize understanding of plan for Vitamin D deficiency  Target goal date re-established to 90 days due to COVID-19 treatment delays - Goal Met   CCM RN CM Interventions:  Completed call with patient on 08/12/18:    Assessed for knowledge and understanding of taking Vitamin D as directed by provider [name], [place]  - pt is taking this medication as prescribed  Discussed next scheduled appointment with PCP [name], [place]; discussed date/time   Patient Self Care Activities:   Verbalizes understanding of the education/information provided today  . Self administers medications as prescribed . Attends all scheduled provider appointments . Calls provider office for new concerns or questions   Plan:   RNCM will follow up with patient in 2-3 weeks  Please see past updates related to this goal by clicking on the "Past Updates" button in the selected goal     . "I want to get my A1c down" (pt-stated)       Current Barriers:  [name] Knowledge Deficits related to Diabetes and Self Health Management  Nurse Case Manager Clinical Goal(s):  [name] Over the next 90 days, patient will demonstrate improved adherence to prescribed treatment plan for Diabetes as evidenced by patient will report improved adherence to following a Diabetic diet; exercise and taking all Diabetic medications exactly as prescribed 08/12/18: Re-established target goal date to 90 days due to COVID-19 treatment delays   CCM RN CM Interventions:  Completed call with patient on 08/12/18:   Comprehensive DM assessment completed - patient continues to check her BG levels twice daily, reports cutting her Lantus  down to half the ordered dose for 2 weeks while waiting on her refill - denies financial hardship with paying for her medications - pt reports lowest BG of 70; highest BG level in the 200's; pt states she believes the quarantine and being less active is causing higher BG levels  Assessed for adherence to following her ADA diet, pt reports adherence "most of the time"  Reinforced importance of total adherence to her prescribed DM treatment plan for best results  Reinforced importance of implementing exercise back into her daily routine as soon as possible to help control her diabetes and to achieve improved overall health   Referral sent to embedded PharmD Lottie Dawson requesting she review patient's diabetic medication regimen - for evaluation   Printed diabetes ed mats to mail to patient for review: Controlling Your Diabetes: Meal Planning Using the Plate Method; Know Your A1C; signs/symptoms of Hypo/Hyperglycemia  Discussed plans with patient for ongoing care management follow up and provided patient with direct contact information for care management team  Patient Self Care Activities:   Verbalizes understanding of the education/information provided today  . Self administers medications as prescribed . Attends all scheduled provider appointments . Calls provider office for new concerns or questions   Please see past updates related to this goal by clicking on the "Past Updates" button in the selected goal     . COMPLETED: "I will try the Crestor again for my Cholesterol"  (pt-stated)       Current Barriers:  [name] Knowledge Deficits  related to Hyperlipidemia  . Non-adherence to prescribed pharmacological recommendations for Hyperlipidemia  Nurse Case Manager Clinical Goal(s):  [name] Over the next 90 days, patient will report adherence to her Diabetic, low fat, low Sodium diet; she will resume taking a Hyperlipidemic medication. Goal target date re-established to 90 days due to COVID-19  treatment delays. Goal Met  CCM RN CM Interventions:  Completed call with patient on 08/12/18:    Assessed for medication adherence - pt reports she is taking her Hyperlipidemia exactly as prescribed, she is adhering to following her diabetic diet as directed  Patient Self Care Activities:   Verbalizes understanding of the education/information provided  . Self administers medications as prescribed but is not adhering to taking Cholesterol medication . Attends all scheduled provider appointments . Calls provider office for new concerns or questions  Plan:   RNCM will follow up with patient in 2-3 weeks by telephone  Please see past updates related to this goal by clicking on the "Past Updates" button in the selected goal     . COMPLETED: "I'll purchase a BP cuff so I can start checking my BP" (pt-stated)       Current Barriers:  [name] Knowledge Deficits related to Hypertension  Nurse Case Manager Clinical Goal(s):  Over the next 90 days, patient will verbalize basic understanding of Hypertension  disease process and self health management plan as evidenced by patient will take all BP meds exactly as prescribed without missed doses; she will report improved adherence to following a low Sodium diet. 08/12/18: Re-established target goal date to 90 days due to COVID-19 treatment delays. Goal Met  CCM RN CM Interventions:  Completed call with patient on 08/12/18:    Assessed for knowledge and understanding of disease process for Hypertension - pt verbalizes understanding  Assessed for medication adherence - patient is adhering to taking all BP meds as directed, denies missed doses  Assessed for adherence to following a low sodium diet - pt verbalizes adherence  Assessed for questions/concerns - pt denies   Patient Self Care Activities:  Verbalizes understanding of the education/information provided today  . Self administers medications as prescribed . Attends all scheduled provider  appointments . Calls provider office for new concerns or questions   Plan: RNCM will follow up with patient in 2-3 weeks  Please see past updates related to this goal by clicking on the "Past Updates" button in the selected goal        The patient verbalized understanding of instructions provided today and declined a print copy of patient instruction materials.   Telephone follow up appointment with care management team member scheduled for:  08/25/18   Barb Merino, [facility] Care Management Coordinator [place] [facility]  Direct Phone: [phone]

## 2018-08-12 NOTE — Chronic Care Management (AMB) (Signed)
Chronic Care Management   Follow Up Note   08/12/2018 Name: Jacqueline Bell MRN: [id number] DOB: 1950-09-03  Referred by: [name], FNP Reason for referral : Chronic Care Management (CCM RNCM Telephone Follow Up )   Jacqueline Bell is a 68 y.o. year old female who is a primary care patient of [name], [place]. The CCM team was consulted for assistance with chronic disease management and care coordination needs.    Review of patient status, including review of consultants reports, relevant laboratory and other test results, and collaboration with appropriate care team members and the patient's provider was performed as part of comprehensive patient evaluation and provision of chronic care management services.    I spoke with Mrs. Carel by telephone today to follow up on her DM.   Goals Addressed      Patient Stated   . COMPLETED: "I don't know if I am suppose to be taking Vitamin D now" (pt-stated)       Current Barriers:  [name] Knowledge Deficit related to medication management for Vitamin D deficiency  Nurse Case Manager Clinical Goal(s):  [name] Over the next 90 days, patient will verbalize understanding of plan for Vitamin D deficiency  Target goal date re-established to 90 days due to COVID-19 treatment delays - Goal Met   CCM RN CM Interventions:  Completed call with patient on 08/12/18:    Assessed for knowledge and understanding of taking Vitamin D as directed by provider [name], [place]  - pt is taking this medication as prescribed  Discussed next scheduled appointment with PCP [name], [place]; discussed date/time   Patient Self Care Activities:   Verbalizes understanding of the education/information provided today  . Self administers medications as prescribed . Attends all scheduled provider appointments . Calls provider office for new concerns or questions   Plan:   RNCM will follow up with patient in 2-3 weeks  Please see past updates related to this goal  by clicking on the "Past Updates" button in the selected goal     . "I want to get my A1c down" (pt-stated)       Current Barriers:  [name] Knowledge Deficits related to Diabetes and Self Health Management  Nurse Case Manager Clinical Goal(s):  [name] Over the next 90 days, patient will demonstrate improved adherence to prescribed treatment plan for Diabetes as evidenced by patient will report improved adherence to following a Diabetic diet; exercise and taking all Diabetic medications exactly as prescribed 08/12/18: Re-established target goal date to 90 days due to COVID-19 treatment delays   CCM RN CM Interventions:  Completed call with patient on 08/12/18:   Comprehensive DM assessment completed - patient continues to check her BG levels twice daily, reports cutting her Lantus down to half the ordered dose for 2 weeks while waiting on her refill - denies financial hardship with paying for her medications - pt reports lowest BG of 70; highest BG level in the 200's; pt states she believes the quarantine and being less active is causing higher BG levels  Assessed for adherence to following her ADA diet, pt reports adherence "most of the time"  Reinforced importance of total adherence to her prescribed DM treatment plan for best results  Reinforced importance of implementing exercise back into her daily routine as soon as possible to help control her diabetes and to achieve improved overall health   Referral sent to embedded PharmD Lottie Dawson requesting she review patient's diabetic medication regimen - for evaluation  Printed diabetes ed mats to mail to patient for review: Controlling Your Diabetes: Meal Planning Using the Plate Method; Know Your A1C; signs/symptoms of Hypo/Hyperglycemia  Discussed plans with patient for ongoing care management follow up and provided patient with direct contact information for care management team  Patient Self Care Activities:   Verbalizes understanding of the  education/information provided today  . Self administers medications as prescribed . Attends all scheduled provider appointments . Calls provider office for new concerns or questions   Please see past updates related to this goal by clicking on the "Past Updates" button in the selected goal     . COMPLETED: "I will try the Crestor again for my Cholesterol"  (pt-stated)       Current Barriers:  [name] Knowledge Deficits related to Hyperlipidemia  . Non-adherence to prescribed pharmacological recommendations for Hyperlipidemia  Nurse Case Manager Clinical Goal(s):  [name] Over the next 90 days, patient will report adherence to her Diabetic, low fat, low Sodium diet; she will resume taking a Hyperlipidemic medication. Goal target date re-established to 90 days due to COVID-19 treatment delays. Goal Met  CCM RN CM Interventions:  Completed call with patient on 08/12/18:    Assessed for medication adherence - pt reports she is taking her Hyperlipidemia exactly as prescribed, she is adhering to following her diabetic diet as directed  Patient Self Care Activities:   Verbalizes understanding of the education/information provided  . Self administers medications as prescribed but is not adhering to taking Cholesterol medication . Attends all scheduled provider appointments . Calls provider office for new concerns or questions  Plan:   RNCM will follow up with patient in 2-3 weeks by telephone  Please see past updates related to this goal by clicking on the "Past Updates" button in the selected goal     . COMPLETED: "I'll purchase a BP cuff so I can start checking my BP" (pt-stated)       Current Barriers:  [name] Knowledge Deficits related to Hypertension  Nurse Case Manager Clinical Goal(s):  Over the next 90 days, patient will verbalize basic understanding of Hypertension  disease process and self health management plan as evidenced by patient will take all BP meds exactly as prescribed without  missed doses; she will report improved adherence to following a low Sodium diet. 08/12/18: Re-established target goal date to 90 days due to COVID-19 treatment delays. Goal Met  CCM RN CM Interventions:  Completed call with patient on 08/12/18:    Assessed for knowledge and understanding of disease process for Hypertension - pt verbalizes understanding  Assessed for medication adherence - patient is adhering to taking all BP meds as directed, denies missed doses  Assessed for adherence to following a low sodium diet - pt verbalizes adherence  Assessed for questions/concerns - pt denies   Patient Self Care Activities:  Verbalizes understanding of the education/information provided today  . Self administers medications as prescribed . Attends all scheduled provider appointments . Calls provider office for new concerns or questions   Plan: RNCM will follow up with patient in 2-3 weeks  Please see past updates related to this goal by clicking on the "Past Updates" button in the selected goal         The care management team will reach out to the patient again over the next 2 weeks.    Barb Merino, RN,CCM Care Management Coordinator [place] [facility]  Direct Phone: [phone]

## 2018-08-24 ENCOUNTER — Ambulatory Visit: Payer: Medicare Other | Admitting: Nurse Practitioner

## 2018-08-25 ENCOUNTER — Telehealth: Payer: Self-pay

## 2018-08-26 ENCOUNTER — Ambulatory Visit (INDEPENDENT_AMBULATORY_CARE_PROVIDER_SITE_OTHER): Payer: Medicare Other | Admitting: Pharmacist

## 2018-08-26 DIAGNOSIS — I10 Essential (primary) hypertension: Secondary | ICD-10-CM | POA: Diagnosis not present

## 2018-08-26 DIAGNOSIS — Z794 Long term (current) use of insulin: Secondary | ICD-10-CM

## 2018-08-26 DIAGNOSIS — E119 Type 2 diabetes mellitus without complications: Secondary | ICD-10-CM | POA: Diagnosis not present

## 2018-08-26 NOTE — Progress Notes (Signed)
  Chronic Care Management   Outreach Note  08/26/2018 Name: Jacqueline Bell MRN: [id number] DOB: Dec 25, 1950  Referred by: [name], FNP Reason for referral : Chronic Care Management   An unsuccessful telephone outreach was attempted today. The patient was referred to the case management team by for assistance with chronic care management and care coordination.   Follow Up Plan: The care management team will reach out to the patient again over the next 3-5 business days days.   Regina Eck, PharmD, BCPS Clinical Pharmacist, [facility] [place]: [phone]

## 2018-08-30 ENCOUNTER — Ambulatory Visit: Payer: Medicare Other | Admitting: Pharmacist

## 2018-08-30 DIAGNOSIS — Z794 Long term (current) use of insulin: Secondary | ICD-10-CM | POA: Diagnosis not present

## 2018-08-30 DIAGNOSIS — I10 Essential (primary) hypertension: Secondary | ICD-10-CM

## 2018-08-30 DIAGNOSIS — E119 Type 2 diabetes mellitus without complications: Secondary | ICD-10-CM

## 2018-08-30 NOTE — Progress Notes (Signed)
Chronic Care Management   Initial Visit Note  08/30/2018 Name: Jacqueline Bell MRN: [id number] DOB: 12/15/1950  Referred by: Arnette FeltsMoore, Janece, FNP Reason for referral : Chronic Care Management   Jacqueline SakaiRosalind R Bleich is a 68 y.o. year old female who is a primary care patient of Arnette FeltsMoore, Janece, FNP. The CCM team was consulted for assistance with chronic disease management and care coordination needs.   Review of patient status, including review of consultants reports, relevant laboratory and other test results, and collaboration with appropriate care team members and the patient's provider was performed as part of comprehensive patient evaluation and provision of chronic care management services.    I spoke with Ms. Phegley by telephone today.  Objective:   Goals Addressed            This Visit's Progress     Patient Stated   . "I want to get my A1c down" (pt-stated)       Current Barriers:  [name]. Knowledge Deficits related to Diabetes and Self Health Management  Nurse Case Manager Clinical Goal(s):  [name]. Over the next 90 days, patient will demonstrate improved adherence to prescribed treatment plan for Diabetes as evidenced by patient will report improved adherence to following a Diabetic diet; exercise and taking all Diabetic medications exactly as prescribed 08/12/18: Re-established target goal date to 90 days due to COVID-19 treatment delays   CCM RN CM Interventions:  Completed call with patient on 08/12/18:   Comprehensive DM assessment completed - patient continues to check her BG levels twice daily, reports cutting her Lantus down to half the ordered dose for 2 weeks while waiting on her refill - denies financial hardship with paying for her medications - pt reports lowest BG of 70; highest BG level in the 200's; pt states she believes the quarantine and being less active is causing higher BG levels  Assessed for adherence to following her ADA diet, pt reports adherence "most of the  time"  Reinforced importance of total adherence to her prescribed DM treatment plan for best results  Reinforced importance of implementing exercise back into her daily routine as soon as possible to help control her diabetes and to achieve improved overall health   Referral sent to embedded PharmD Vanice SarahJulie Trae Bovenzi requesting she review patient's diabetic medication regimen - for evaluation   Printed diabetes ed mats to mail to patient for review: Controlling Your Diabetes: Meal Planning Using the Plate Method; Know Your A1C; signs/symptoms of Hypo/Hyperglycemia  Discussed plans with patient for ongoing care management follow up and provided patient with direct contact information for care management team  CCM PharmD Interventions:  Completed call with patient on 08/30/18: . Comprehensive medication review performed.  Reviewed medication fill history via insurance claims data.  [facility].  Patient often stretches insulin while awaiting refill shipment.  Discussed ordering medications at least 1-2 weeks before they are due. . Reviewed & discussed the following diabetes-related information with patient: o Continue checking blood sugars as directed o Follow ADA recommended "diabetes-friendly" diet  (reviewed healthy snack/food options) o Confirmed current DM regimen: Lantus 20-26 units qHS, Ozempic 1mg  weekly, Novolog per sliding scale o Discussed insulin/GLP-1 injection technique; Patient has freestyle libre glucometer, but has not set it up yet.  Encouraged patient to bring it to next PCP appt and we can assist her.  She is using an older glucometer to check Bgs daily.  She reports checking BG 3-4 times a  day o Reviewed medication purpose/side effects-->patient denies adverse events, denies hypoglycemia, reports FBG 150-180, this AM it was 159 due to late night snack.  Encouraged patient to use the middle to upper end of her Lantus  range of 20-26 units qHs.  She has been only taking 20 units a day and reports FBG 150-180.  Would aim to maximize basal insulin, continue optimized GLP-1, and hope to peel back meal time insulin. o Most recent A1c is 7.7. Reminded patient of upcoming PCP appt on 09/19/18 o Patient is now back to work and starting to move around more.  Encouraged patient to walk early in the AM or later in the evening as able. o Will address ASA/statin at next appt. o Continue taking all medications as prescribed by provider   Patient Self Care Activities:   Verbalizes understanding of the education/information provided today  . Self administers medications as prescribed . Attends all scheduled provider appointments . Calls provider office for new concerns or questions    Please see past updates related to this goal by clicking on the "Past Updates" button in the selected goal          Plan:   The care management team will reach out to the patient again over the next 2-3 weeks days.   Regina Eck, PharmD, BCPS Clinical Pharmacist, [facility] [place]: [phone]

## 2018-08-30 NOTE — Patient Instructions (Signed)
Visit Information  Goals Addressed            This Visit's Progress     Patient Stated   . "I want to get my A1c down" (pt-stated)       Current Barriers:  [name] Knowledge Deficits related to Diabetes and Self Health Management  Nurse Case Manager Clinical Goal(s):  [name] Over the next 90 days, patient will demonstrate improved adherence to prescribed treatment plan for Diabetes as evidenced by patient will report improved adherence to following a Diabetic diet; exercise and taking all Diabetic medications exactly as prescribed 08/12/18: Re-established target goal date to 90 days due to COVID-19 treatment delays   CCM RN CM Interventions:  Completed call with patient on 08/12/18:   Comprehensive DM assessment completed - patient continues to check her BG levels twice daily, reports cutting her Lantus down to half the ordered dose for 2 weeks while waiting on her refill - denies financial hardship with paying for her medications - pt reports lowest BG of 70; highest BG level in the 200's; pt states she believes the quarantine and being less active is causing higher BG levels  Assessed for adherence to following her ADA diet, pt reports adherence "most of the time"  Reinforced importance of total adherence to her prescribed DM treatment plan for best results  Reinforced importance of implementing exercise back into her daily routine as soon as possible to help control her diabetes and to achieve improved overall health   Referral sent to embedded PharmD Lottie Dawson requesting she review patient's diabetic medication regimen - for evaluation   Printed diabetes ed mats to mail to patient for review: Controlling Your Diabetes: Meal Planning Using the Plate Method; Know Your A1C; signs/symptoms of Hypo/Hyperglycemia  Discussed plans with patient for ongoing care management follow up and provided patient with direct contact information for care management team  CCM PharmD Interventions:   Completed call with patient on 08/30/18: . Comprehensive medication review performed.  Reviewed medication fill history via insurance claims data.  [facility] to obtain medications.  Patient often stretches insulin while awaiting refill shipment.  Discussed ordering medications at least 1-2 weeks before they are due. . Reviewed & discussed the following diabetes-related information with patient: o Continue checking blood sugars as directed o Follow ADA recommended "diabetes-friendly" diet  (reviewed healthy snack/food options) o Confirmed current DM regimen: Lantus 20-26 units qHS, Ozempic 1mg  weekly, Novolog per sliding scale o Discussed insulin/GLP-1 injection technique; Patient has freestyle libre glucometer, but has not set it up yet.  Encouraged patient to bring it to next PCP appt and we can assist her.  She is using an older glucometer to check Bgs daily.  She reports checking BG 3-4 times a day o Reviewed medication purpose/side effects-->patient denies adverse events, denies hypoglycemia, reports FBG 150-180, this AM it was 159 due to late night snack.  Encouraged patient to use the middle to upper end of her Lantus range of 20-26 units qHs.  She has been only taking 20 units a day and reports FBG 150-180.  Would aim to maximize basal insulin, continue optimized GLP-1, and hope to peel back meal time insulin. o Most recent A1c is 7.7. Reminded patient of upcoming PCP appt on 09/19/18 o Patient is now back to work and starting to move around more.  Encouraged patient to walk early in the AM or later in the evening as able. o Continue taking all medications as  prescribed by provider   Patient Self Care Activities:   Verbalizes understanding of the education/information provided today  . Self administers medications as prescribed . Attends all scheduled provider appointments . Calls provider office for new concerns or questions    Please see  past updates related to this goal by clicking on the "Past Updates" button in the selected goal          The patient verbalized understanding of instructions provided today and declined a print copy of patient instruction materials.   The care management team will reach out to the patient again over the next 2-3 weeks.  Kieth BrightlyJulie Dattero Kemari Narez, PharmD, BCPS Clinical Pharmacist, [facility]  [facility]  Direct Dial: [phone]

## 2018-09-12 ENCOUNTER — Ambulatory Visit: Payer: Medicare Other | Admitting: Nurse Practitioner

## 2018-09-16 ENCOUNTER — Telehealth: Payer: Self-pay

## 2018-09-19 ENCOUNTER — Encounter: Payer: Self-pay | Admitting: Nurse Practitioner

## 2018-09-19 ENCOUNTER — Ambulatory Visit (INDEPENDENT_AMBULATORY_CARE_PROVIDER_SITE_OTHER): Payer: Medicare Other | Admitting: Nurse Practitioner

## 2018-09-19 ENCOUNTER — Other Ambulatory Visit: Payer: Self-pay

## 2018-09-19 ENCOUNTER — Ambulatory Visit (INDEPENDENT_AMBULATORY_CARE_PROVIDER_SITE_OTHER): Payer: Medicare Other | Admitting: Pharmacist

## 2018-09-19 VITALS — BP 148/72 | HR 86 | Temp 98.5°F | Ht 68.0 in | Wt 201.2 lb

## 2018-09-19 DIAGNOSIS — E119 Type 2 diabetes mellitus without complications: Secondary | ICD-10-CM

## 2018-09-19 DIAGNOSIS — Z9114 Patient's other noncompliance with medication regimen: Secondary | ICD-10-CM | POA: Diagnosis not present

## 2018-09-19 DIAGNOSIS — I10 Essential (primary) hypertension: Secondary | ICD-10-CM | POA: Diagnosis not present

## 2018-09-19 DIAGNOSIS — Z794 Long term (current) use of insulin: Secondary | ICD-10-CM | POA: Diagnosis not present

## 2018-09-19 DIAGNOSIS — Z1211 Encounter for screening for malignant neoplasm of colon: Secondary | ICD-10-CM | POA: Diagnosis not present

## 2018-09-19 DIAGNOSIS — E559 Vitamin D deficiency, unspecified: Secondary | ICD-10-CM

## 2018-09-19 MED ORDER — VITAMIN D (ERGOCALCIFEROL) 1.25 MG (50000 UNIT) PO CAPS: 50000.0000 [IU] | ORAL_CAPSULE | ORAL | 0 refills | Status: DC

## 2018-09-19 MED ORDER — TRESIBA FLEXTOUCH 100 UNIT/ML ~~LOC~~ SOPN: 20.0000 [IU] | PEN_INJECTOR | Freq: Every day | SUBCUTANEOUS | 1 refills | Status: DC

## 2018-09-19 NOTE — Patient Instructions (Signed)
   ONLY CHECK YOUR BLOOD SUGARS BEFORE BREAKFAST, BEFORE LUNCH, BEFORE DINNER AND AT BEDTIME AND KEEP THIS LOG.

## 2018-09-19 NOTE — Progress Notes (Signed)
Subjective:     Patient ID: Jacqueline Bell , female    DOB: 1950/04/07 , 68 y.o.   MRN: [id number]   Chief Complaint  Patient presents with  . Diabetes    HPI  Diabetes She presents for her follow-up diabetic visit. She has type 2 diabetes mellitus. Her disease course has been stable. Pertinent negatives for hypoglycemia include no confusion, dizziness or headaches. There are no diabetic associated symptoms. Pertinent negatives for diabetes include no chest pain. Symptoms are stable. There are no diabetic complications. Risk factors for coronary artery disease include sedentary lifestyle. Current diabetic treatment includes oral agent (dual therapy). She is compliant with treatment all of the time. Her weight is stable. She is following a generally healthy diet. When asked about meal planning, she reported none. She has not had a previous visit with a dietitian. She rarely participates in exercise. There is no change in her home blood glucose trend. (She is taking her blood sugar before she eats and after eat. Her blood sugars have ranged 70-230.  ) She does not see a podiatrist.Eye exam is current.  Hypertension This is a chronic problem. The current episode started more than 1 year ago. The problem is uncontrolled. Pertinent negatives include no anxiety, chest pain, headaches or palpitations. Risk factors for coronary artery disease include obesity, sedentary lifestyle and diabetes mellitus. Past treatments include ACE inhibitors. The current treatment provides mild improvement. There is no history of angina. There is no history of chronic renal disease.     Past Medical History:  Diagnosis Date  . Diabetes mellitus without complication ([facility])      History reviewed. No pertinent family history.   Current Outpatient Medications:  .  cholecalciferol (VITAMIN D) 1000 units tablet, Take 1,000 Units by mouth daily., Disp: , Rfl:  .  EPINEPHrine 0.3 mg/0.3 mL IJ SOAJ injection, , Disp: ,  Rfl:  .  fluticasone (FLONASE) 50 MCG/ACT nasal spray, Place 1 spray into both nostrils daily., Disp: 16 g, Rfl: 5 .  insulin glargine (LANTUS) 100 unit/mL SOPN, Inject 20-26 Units into the skin at bedtime., Disp: , Rfl:  .  levocetirizine (XYZAL) 5 MG tablet, Take 1 tablet (5 mg total) by mouth every evening., Disp: 90 tablet, Rfl: 1 .  meloxicam (MOBIC) 15 MG tablet, Take 1 tablet (15 mg total) by mouth as needed., Disp: 90 tablet, Rfl: 0 .  Multiple Vitamin (MULTIVITAMIN) tablet, Take 1 tablet by mouth daily., Disp: , Rfl:  .  Olopatadine HCl (PAZEO) 0.7 % SOLN, Place 1 drop into both eyes daily., Disp: 1 Bottle, Rfl: 5 .  Omega-3 Fatty Acids (FISH OIL PO), Take 2 capsules by mouth daily. , Disp: , Rfl:  .  POTASSIUM BICARBONATE PO, Take 1 tablet by mouth. Patient takes 2 tablets daily, Disp: , Rfl:  .  rosuvastatin (CRESTOR) 5 MG tablet, Take 1 tablet (5 mg total) by mouth daily., Disp: 90 tablet, Rfl: 0 .  Semaglutide,0.25 or 0.5MG /DOS, (OZEMPIC, 0.25 OR 0.5 MG/DOSE,) 2 MG/1.5ML SOPN, Inject 1 mg into the skin once a week. Inject 0.5mg  by subcutaenous route once weekly into the abdomen, thigh or upper arm rotating injection sites, Disp: 6 pen, Rfl: 1 .  telmisartan (MICARDIS) 20 MG tablet, Take 20 mg by mouth daily., Disp: , Rfl:  .  insulin degludec (TRESIBA FLEXTOUCH) 100 UNIT/ML SOPN FlexTouch Pen, Inject 0.2 mLs (20 Units total) into the skin daily., Disp: 15 pen, Rfl: 1 .  Vitamin D, Ergocalciferol, (DRISDOL) 1.25 MG (  50000 UT) CAPS capsule, Take 1 capsule (50,000 Units total) by mouth every 7 (seven) days., Disp: 12 capsule, Rfl: 0   Allergies  Allergen Reactions  . Iodine Hives  . Shellfish Allergy Hives     Review of Systems  Constitutional: Negative.   Respiratory: Negative.  Negative for cough.   Cardiovascular: Negative.  Negative for chest pain, palpitations and leg swelling.  Musculoskeletal: Negative.   Neurological: Negative for dizziness and headaches.   Psychiatric/Behavioral: Negative for confusion.     Today's Vitals   09/19/18 1200  BP: (!) 148/72  Pulse: 86  Temp: 98.5 F (36.9 C)  TempSrc: Oral  Weight: 201 lb 3.2 oz (91.3 kg)  Height: 5\' 8"  (1.727 m)  PainSc: 0-No pain   Body mass index is 30.59 kg/m.   Objective:  Physical Exam Vitals signs reviewed.  Constitutional:      Appearance: Normal appearance.  Cardiovascular:     Rate and Rhythm: Normal rate and regular rhythm.     Pulses: Normal pulses.     Heart sounds: Normal heart sounds. No murmur.  Pulmonary:     Effort: Pulmonary effort is normal.     Breath sounds: Normal breath sounds.  Musculoskeletal: Normal range of motion.  Skin:    General: Skin is warm and dry.     Capillary Refill: Capillary refill takes less than 2 seconds.  Neurological:     General: No focal deficit present.     Mental Status: She is alert and oriented to person, place, and time.  Psychiatric:        Mood and Affect: Mood normal.        Behavior: Behavior normal.        Thought Content: Thought content normal.        Judgment: Judgment normal.         Assessment And Plan:     1. Type 2 diabetes mellitus without complication, with long-term current use of insulin (HCC)  Chronic, poorly controlled  Tolerating ozempic well  Continue with current medications  Encouraged to limit intake of sugary foods and drinks  Encouraged to increase physical activity to 150 minutes per week - Lipid panel - Hemoglobin A1c  2. Essential hypertension . B/P is slightly elevated, she is currently fasting will take her blood pressure medication once she has labs done.  . She has not been taking the telmisartan wanted to control without medications and naturally, I have stressed to her due to her diabetes this medication also helps to protect her kidneys.   . Stressed importance to continue telmisartan for kidney protection and risk for spike in blood pressure and resultant of strokes or  heart attacks  . CMP ordered to check renal function.  . The importance of regular exercise and dietary modification was stressed to the patient.  . She [facility] as well to help with education . Stressed importance of losing ten percent of her body weight to help with B/P control.  . The weight loss would help with decreasing cardiac and cancer risk as well.   - CMP14 + Anion Gap  3. Other long term (current) drug therapy  - TSH   Jacqueline FeltsJanece Terran Klinke, FNP

## 2018-09-20 LAB — CMP14 + ANION GAP
ALT: 13 IU/L (ref 0–32)
AST: 18 IU/L (ref 0–40)
Albumin/Globulin Ratio: 1.5 (ref 1.2–2.2)
Albumin: 4.4 g/dL (ref 3.8–4.8)
Alkaline Phosphatase: 41 IU/L (ref 39–117)
Anion Gap: 15 mmol/L (ref 10.0–18.0)
BUN/Creatinine Ratio: 11 — ABNORMAL LOW (ref 12–28)
BUN: 9 mg/dL (ref 8–27)
Bilirubin Total: 0.3 mg/dL (ref 0.0–1.2)
CO2: 22 mmol/L (ref 20–29)
Calcium: 9.2 mg/dL (ref 8.7–10.3)
Chloride: 102 mmol/L (ref 96–106)
Creatinine, Ser: 0.82 mg/dL (ref 0.57–1.00)
GFR calc Af Amer: 86 mL/min/{1.73_m2} (ref >59)
GFR calc non Af Amer: 75 mL/min/{1.73_m2} (ref >59)
Globulin, Total: 3 g/dL (ref 1.5–4.5)
Glucose: 158 mg/dL — ABNORMAL HIGH (ref 65–99)
Potassium: 4.1 mmol/L (ref 3.5–5.2)
Sodium: 139 mmol/L (ref 134–144)
Total Protein: 7.4 g/dL (ref 6.0–8.5)

## 2018-09-20 LAB — LIPID PANEL
Chol/HDL Ratio: 3 ratio (ref 0.0–4.4)
Cholesterol, Total: 146 mg/dL (ref 100–199)
HDL: 49 mg/dL (ref >39)
LDL Calculated: 76 mg/dL (ref 0–99)
Triglycerides: 104 mg/dL (ref 0–149)
VLDL Cholesterol Cal: 21 mg/dL (ref 5–40)

## 2018-09-20 LAB — HEMOGLOBIN A1C
Est. average glucose Bld gHb Est-mCnc: 183 mg/dL
Hgb A1c MFr Bld: 8 % — ABNORMAL HIGH (ref 4.8–5.6)

## 2018-09-22 NOTE — Patient Instructions (Signed)
Visit Information  Goals Addressed            This Visit's Progress     Patient Stated   . "I want to get my A1c down" (pt-stated)       Current Barriers:  [name] Knowledge Deficits related to Diabetes and Self Health Management  Nurse Case Manager Clinical Goal(s):  [name] Over the next 90 days, patient will demonstrate improved adherence to prescribed treatment plan for Diabetes as evidenced by patient will report improved adherence to following a Diabetic diet; exercise and taking all Diabetic medications exactly as prescribed 08/12/18: Re-established target goal date to 90 days due to COVID-19 treatment delays   CCM RN CM Interventions:  Completed call with patient on 08/12/18:   Comprehensive DM assessment completed - patient continues to check her BG levels twice daily, reports cutting her Lantus down to half the ordered dose for 2 weeks while waiting on her refill - denies financial hardship with paying for her medications - pt reports lowest BG of 70; highest BG level in the 200's; pt states she believes the quarantine and being less active is causing higher BG levels  Assessed for adherence to following her ADA diet, pt reports adherence "most of the time"  Reinforced importance of total adherence to her prescribed DM treatment plan for best results  Reinforced importance of implementing exercise back into her daily routine as soon as possible to help control her diabetes and to achieve improved overall health   Referral sent to embedded PharmD Lottie Dawson requesting she review patient's diabetic medication regimen - for evaluation   Printed diabetes ed mats to mail to patient for review: Controlling Your Diabetes: Meal Planning Using the Plate Method; Know Your A1C; signs/symptoms of Hypo/Hyperglycemia  Discussed plans with patient for ongoing care management follow up and provided patient with direct contact information for care management team  CCM PharmD Interventions:   Completed face to face visit with patient on 09/19/18: . Comprehensive medication review performed.  Reviewed medication fill history via insurance claims data.  Patient uses [facility] to obtain medications.  Patient often stretches insulin while awaiting refill shipment.  Discussed ordering medications at least 1-2 weeks before they are due. . Reviewed & discussed the following diabetes-related information with patient: o Continue checking blood sugars as directed o Follow ADA recommended "diabetes-friendly" diet  (reviewed healthy snack/food options) o Confirmed current DM regimen: Lantus 20-26 units qHS, Ozempic 1mg  weekly, Novolog per sliding scale o Discussed insulin/GLP-1 injection technique; Patient has freestyle libre glucometer, but has not set it up yet.  Patient will come in to see CCM PharmD to set up [facility] scanner glucometer next Wednesday, July 22nd at 12:30pm.  She is using an older glucometer to check Bgs daily.  She reports checking BG 3-4 times a day, however at random times.  PCP encouraged patient to only check FBG, qHS and before meals so that we are able to gauge mealtime coverage and assess true needs. o Reviewed medication purpose/side effects-->patient denies adverse events, denies hypoglycemia, reports FBG 150-180, this AM it was 159 due to late night snack.  Encouraged patient to use the middle to upper end of her Lantus range of 20-26 units qHs.  She has been only taking 20 units a day and reports FBG 150-180.  Would aim to maximize basal insulin, continue optimized GLP-1, and hope to peel back meal time insulin. o Most recent A1c is 7.7.  o Patient is  now back to work and starting to move around more.  Encouraged patient to walk early in the AM or later in the evening as able. o Last fill of statin was 90-day supply on 06/15/18.  Patient experiencing myopathy.  Will continue to address. o Continue taking all medications as prescribed  by provider   Patient Self Care Activities:   Verbalizes understanding of the education/information provided today  . Self administers medications as prescribed . Attends all scheduled provider appointments . Calls provider office for new concerns or questions    Please see past updates related to this goal by clicking on the "Past Updates" button in the selected goal          The patient verbalized understanding of instructions provided today and declined a print copy of patient instruction materials.   The care management team will reach out to the patient again over the next 7  days.   Kieth BrightlyJulie Dattero Natasha Burda, PharmD, BCPS Clinical Pharmacist, [facility]  [facility]  Direct Dial: [phone]

## 2018-09-22 NOTE — Progress Notes (Signed)
Chronic Care Management   Visit Note  09/19/2018 Name: Jacqueline Bell MRN: [id number] DOB: Dec 28, 1950  Referred by: [name], FNP Reason for referral : Chronic Care Management   Jacqueline Bell is a 68 y.o. year old female who is a primary care patient of [name], [facility]. The CCM team was consulted for assistance with chronic disease management and care coordination needs.   Review of patient status, including review of consultants reports, relevant laboratory and other test results, and collaboration with appropriate care team members and the patient's provider was performed as part of comprehensive patient evaluation and provision of chronic care management services.    I met with Jacqueline Bell in clinic today.  Objective:   Goals Addressed            This Visit's Progress     Patient Stated   . "I want to get my A1c down" (pt-stated)       Current Barriers:  [name] Knowledge Deficits related to Diabetes and Self Health Management  Nurse Case Manager Clinical Goal(s):  [name] Over the next 90 days, patient will demonstrate improved adherence to prescribed treatment plan for Diabetes as evidenced by patient will report improved adherence to following a Diabetic diet; exercise and taking all Diabetic medications exactly as prescribed 08/12/18: Re-established target goal date to 90 days due to COVID-19 treatment delays   CCM RN CM Interventions:  Completed call with patient on 08/12/18:   Comprehensive DM assessment completed - patient continues to check her BG levels twice daily, reports cutting her Lantus down to half the ordered dose for 2 weeks while waiting on her refill - denies financial hardship with paying for her medications - pt reports lowest BG of 70; highest BG level in the 200's; pt states she believes the quarantine and being less active is causing higher BG levels  Assessed for adherence to following her ADA diet, pt reports adherence "most of the time"   Reinforced importance of total adherence to her prescribed DM treatment plan for best results  Reinforced importance of implementing exercise back into her daily routine as soon as possible to help control her diabetes and to achieve improved overall health   Referral sent to embedded PharmD Lottie Dawson requesting she review patient's diabetic medication regimen - for evaluation   Printed diabetes ed mats to mail to patient for review: Controlling Your Diabetes: Meal Planning Using the Plate Method; Know Your A1C; signs/symptoms of Hypo/Hyperglycemia  Discussed plans with patient for ongoing care management follow up and provided patient with direct contact information for care management team  CCM PharmD Interventions:  Completed face to face visit with patient on 09/19/18: . Comprehensive medication review performed.  Reviewed medication fill history via insurance claims data.  Patient uses [facility] order to obtain medications.  Patient often stretches insulin while awaiting refill shipment.  Discussed ordering medications at least 1-2 weeks before they are due. . Reviewed & discussed the following diabetes-related information with patient: o Continue checking blood sugars as directed o Follow ADA recommended "diabetes-friendly" diet  (reviewed healthy snack/food options) o Confirmed current DM regimen: Lantus 20-26 units qHS, Ozempic 66m weekly, Novolog per sliding scale o Discussed insulin/GLP-1 injection technique; Patient has freestyle libre glucometer, but has not set it up yet.  Patient will come in to see CCM PharmD [facility] next Wednesday, July 22nd at 12:30pm.  She is using an older glucometer to check Bgs  daily.  She reports checking BG 3-4 times a day, however at random times.  PCP encouraged patient to only check FBG, qHS and before meals so that we are able to gauge mealtime coverage and assess true needs. o Reviewed  medication purpose/side effects-->patient denies adverse events, denies hypoglycemia, reports FBG 150-180, this AM it was 159 due to late night snack.  Encouraged patient to use the middle to upper end of her Lantus range of 20-26 units qHs.  She has been only taking 20 units a day and reports FBG 150-180.  Would aim to maximize basal insulin, continue optimized GLP-1, and hope to peel back meal time insulin. o Most recent A1c has increased from  7.7 to 8.0.  Will assess new BG readings next week and set up Freestyle libre to assess future insulin dosing/titrations. o Patient is now back to work and starting to move around more.  Encouraged patient to walk early in the AM or later in the evening as able. o Last statin fill was for 90-days on 06/16/18.  Patient experiencing side effects.  Will continue to explore.  o Continue taking all medications as prescribed by provider   Patient Self Care Activities:   Verbalizes understanding of the education/information provided today  . Self administers medications as prescribed . Attends all scheduled provider appointments . Calls provider office for new concerns or questions    Please see past updates related to this goal by clicking on the "Past Updates" button in the selected goal           Plan:   The care management team will reach out to the patient again over the next 7 days.   Regina Eck, PharmD, BCPS Clinical Pharmacist, [facility] [place]: [phone]

## 2018-09-26 NOTE — Progress Notes (Signed)
She need to limit the amount of fruits she is eating.  Increase her fiber intake with benefiber daily.

## 2018-09-28 ENCOUNTER — Telehealth: Payer: Medicare Other

## 2018-10-06 ENCOUNTER — Telehealth: Payer: Self-pay

## 2018-10-06 NOTE — Telephone Encounter (Signed)
Patient called requesting a sample of tresiba.  I HAVE RETURNED PT CALL AND ADVISED HER SHE HAS A SAMPLE HERE FOR PICK UP. YRL,RMA

## 2018-10-10 ENCOUNTER — Telehealth: Payer: Self-pay

## 2018-10-10 ENCOUNTER — Other Ambulatory Visit: Payer: Self-pay | Admitting: Nurse Practitioner

## 2018-10-10 DIAGNOSIS — Z794 Long term (current) use of insulin: Secondary | ICD-10-CM

## 2018-10-10 DIAGNOSIS — E119 Type 2 diabetes mellitus without complications: Secondary | ICD-10-CM

## 2018-10-10 NOTE — Telephone Encounter (Signed)
Patient called stating tresiba and ozempic needs to be signed off on so she can get the refill.  I HAVE RETURNED PT CALL AND NOTIFIED HER THAT WE HAVE COMPLETED THE FORMS AND FAXED THEM OVER. YRL,RMA

## 2018-10-19 ENCOUNTER — Telehealth: Payer: Medicare Other

## 2018-11-04 ENCOUNTER — Telehealth: Payer: Self-pay

## 2018-11-17 ENCOUNTER — Telehealth: Payer: Self-pay

## 2018-11-21 ENCOUNTER — Encounter: Payer: Self-pay | Admitting: Nurse Practitioner

## 2018-11-21 ENCOUNTER — Other Ambulatory Visit: Payer: Self-pay

## 2018-11-21 ENCOUNTER — Ambulatory Visit (INDEPENDENT_AMBULATORY_CARE_PROVIDER_SITE_OTHER): Payer: Medicare Other | Admitting: Nurse Practitioner

## 2018-11-21 VITALS — BP 140/68 | HR 85 | Temp 98.9°F | Ht 66.8 in | Wt 197.8 lb

## 2018-11-21 DIAGNOSIS — R252 Cramp and spasm: Secondary | ICD-10-CM | POA: Diagnosis not present

## 2018-11-21 DIAGNOSIS — Z23 Encounter for immunization: Secondary | ICD-10-CM | POA: Diagnosis not present

## 2018-11-21 DIAGNOSIS — E1169 Type 2 diabetes mellitus with other specified complication: Secondary | ICD-10-CM

## 2018-11-21 DIAGNOSIS — I10 Essential (primary) hypertension: Secondary | ICD-10-CM

## 2018-11-21 DIAGNOSIS — E11319 Type 2 diabetes mellitus with unspecified diabetic retinopathy without macular edema: Secondary | ICD-10-CM | POA: Diagnosis not present

## 2018-11-21 DIAGNOSIS — Z79899 Other long term (current) drug therapy: Secondary | ICD-10-CM | POA: Diagnosis not present

## 2018-11-21 DIAGNOSIS — E119 Type 2 diabetes mellitus without complications: Secondary | ICD-10-CM

## 2018-11-21 DIAGNOSIS — Z794 Long term (current) use of insulin: Secondary | ICD-10-CM | POA: Diagnosis not present

## 2018-11-21 NOTE — Patient Instructions (Signed)
Influenza Virus Vaccine (Flucelvax) What is this medicine? INFLUENZA VIRUS VACCINE (in floo EN zuh VAHY ruhs vak SEEN) helps to reduce the risk of getting influenza also known as the flu. The vaccine only helps protect you against some strains of the flu. This medicine may be used for other purposes; ask your health care provider or pharmacist if you have questions. COMMON BRAND NAME(S): FLUCELVAX What should I tell my health care provider before I take this medicine? They need to know if you have any of these conditions:  bleeding disorder like hemophilia  fever or infection  Guillain-Barre syndrome or other neurological problems  immune system problems  infection with the human immunodeficiency virus (HIV) or AIDS  low blood platelet counts  multiple sclerosis  an unusual or allergic reaction to influenza virus vaccine, other medicines, foods, dyes or preservatives  pregnant or trying to get pregnant  breast-feeding How should I use this medicine? This vaccine is for injection into a muscle. It is given by a health care professional. A copy of Vaccine Information Statements will be given before each vaccination. Read this sheet carefully each time. The sheet may change frequently. Talk to your pediatrician regarding the use of this medicine in children. Special care may be needed. Overdosage: If you think you've taken too much of this medicine contact a poison control center or emergency room at once. Overdosage: If you think you have taken too much of this medicine contact a poison control center or emergency room at once. NOTE: This medicine is only for you. Do not share this medicine with others. What if I miss a dose? This does not apply. What may interact with this medicine?  chemotherapy or radiation therapy  medicines that lower your immune system like etanercept, anakinra, infliximab, and adalimumab  medicines that treat or prevent blood clots like  warfarin  phenytoin  steroid medicines like prednisone or cortisone  theophylline  vaccines This list may not describe all possible interactions. Give your health care provider a list of all the medicines, herbs, non-prescription drugs, or dietary supplements you use. Also tell them if you smoke, drink alcohol, or use illegal drugs. Some items may interact with your medicine. What should I watch for while using this medicine? Report any side effects that do not go away within 3 days to your doctor or health care professional. Call your health care provider if any unusual symptoms occur within 6 weeks of receiving this vaccine. You may still catch the flu, but the illness is not usually as bad. You cannot get the flu from the vaccine. The vaccine will not protect against colds or other illnesses that may cause fever. The vaccine is needed every year. What side effects may I notice from receiving this medicine? Side effects that you should report to your doctor or health care professional as soon as possible:  allergic reactions like skin rash, itching or hives, swelling of the face, lips, or tongue Side effects that usually do not require medical attention (Report these to your doctor or health care professional if they continue or are bothersome.):  fever  headache  muscle aches and pains  pain, tenderness, redness, or swelling at the injection site  tiredness This list may not describe all possible side effects. Call your doctor for medical advice about side effects. You may report side effects to FDA at 1-800-FDA-1088. Where should I keep my medicine? The vaccine will be given by a health care professional in a clinic, pharmacy, doctor's   office, or other health care setting. You will not be given vaccine doses to store at home. NOTE: This sheet is a summary. It may not cover all possible information. If you have questions about this medicine, talk to your doctor, pharmacist, or  health care provider.  2020 Elsevier/Gold Standard (2011-02-04 14:06:47)  

## 2018-11-21 NOTE — Progress Notes (Addendum)
Subjective:     Patient ID: Jacqueline Bell , female    DOB: [birth year] , 68 y.o.   MRN: [id number]   Chief Complaint  Patient presents with  . Diabetes    HPI  She is taking Tresiba (20 units) and Ozempic 1 mg (weekly).    Diabetes She presents for her follow-up diabetic visit. She has type 2 diabetes mellitus. Her disease course has been stable. Pertinent negatives for hypoglycemia include no confusion, dizziness or headaches. There are no diabetic associated symptoms. Pertinent negatives for diabetes include no chest pain, no polydipsia, no polyphagia and no polyuria. Symptoms are stable. There are no diabetic complications. Risk factors for coronary artery disease include sedentary lifestyle. Current diabetic treatment includes oral agent (dual therapy). She is compliant with treatment all of the time. Her weight is stable. She is following a generally healthy diet. When asked about meal planning, she reported none. She has not had a previous visit with a dietitian. She rarely participates in exercise. There is no change in her home blood glucose trend. (She is now using the [place].  Her blood sugars averaged 122 for the last 122 and 14 days 121, last 30 has been 134.   ) She does not see a podiatrist.Eye exam is current.  Hypertension This is a chronic problem. The current episode started more than 1 year ago. The problem is unchanged. The problem is uncontrolled. Pertinent negatives include no anxiety, chest pain, headaches or palpitations. Risk factors for coronary artery disease include obesity, sedentary lifestyle and diabetes mellitus. Past treatments include ACE inhibitors. The current treatment provides mild improvement. There are no compliance problems.  There is no history of angina. There is no history of chronic renal disease.     Past Medical History:  Diagnosis Date  . Diabetes mellitus without complication ([place])      No family history on file.   Current Outpatient  Medications:  .  cholecalciferol (VITAMIN D) 1000 units tablet, Take 1,000 Units by mouth daily., Disp: , Rfl:  .  EPINEPHrine 0.3 mg/0.3 mL IJ SOAJ injection, , Disp: , Rfl:  .  fluticasone (FLONASE) 50 MCG/ACT nasal spray, Place 1 spray into both nostrils daily., Disp: 16 g, Rfl: 5 .  insulin degludec (TRESIBA FLEXTOUCH) 100 UNIT/ML SOPN FlexTouch Pen, Inject 0.2 mLs (20 Units total) into the skin daily., Disp: 15 pen, Rfl: 1 .  levocetirizine (XYZAL) 5 MG tablet, Take 1 tablet (5 mg total) by mouth every evening., Disp: 90 tablet, Rfl: 1 .  meloxicam (MOBIC) 15 MG tablet, Take 1 tablet (15 mg total) by mouth as needed., Disp: 90 tablet, Rfl: 0 .  Multiple Vitamin (MULTIVITAMIN) tablet, Take 1 tablet by mouth daily., Disp: , Rfl:  .  Olopatadine HCl (PAZEO) 0.7 % SOLN, Place 1 drop into both eyes daily., Disp: 1 Bottle, Rfl: 5 .  Omega-3 Fatty Acids (FISH OIL PO), Take 2 capsules by mouth daily. , Disp: , Rfl:  .  OZEMPIC, 1 MG/DOSE, 2 MG/1.5ML SOPN, INJECT 1 MG UNDER THE SKIN ONCE WEEKLY, Disp: 9 mL, Rfl: 3 .  POTASSIUM BICARBONATE PO, Take 1 tablet by mouth. Patient takes 2 tablets daily, Disp: , Rfl:  .  rosuvastatin (CRESTOR) 5 MG tablet, Take 1 tablet (5 mg total) by mouth daily., Disp: 90 tablet, Rfl: 0 .  telmisartan (MICARDIS) 20 MG tablet, Take 20 mg by mouth daily., Disp: , Rfl:  .  Vitamin D, Ergocalciferol, (DRISDOL) 1.25 MG (50000 UT) CAPS capsule, Take  1 capsule (50,000 Units total) by mouth every 7 (seven) days., Disp: 12 capsule, Rfl: 0   Allergies  Allergen Reactions  . Iodine Hives  . Shellfish Allergy Hives     Review of Systems  Constitutional: Negative.   Respiratory: Negative.  Negative for cough.   Cardiovascular: Negative.  Negative for chest pain, palpitations and leg swelling.  Endocrine: Negative for polydipsia, polyphagia and polyuria.  Musculoskeletal: Negative.   Neurological: Negative for dizziness and headaches.  Psychiatric/Behavioral: Negative for  confusion.     Today's Vitals   11/21/18 1156  BP: 140/68  Pulse: 85  Temp: 98.9 F (37.2 C)  TempSrc: Oral  Weight: 197 lb 12.8 oz (89.7 kg)  Height: 5' 6.8" (1.697 m)  PainSc: 3   PainLoc: Ear   Body mass index is 31.17 kg/m.   Objective:  Physical Exam Vitals signs reviewed.  Constitutional:      Appearance: Normal appearance.  Cardiovascular:     Rate and Rhythm: Normal rate and regular rhythm.     Pulses: Normal pulses.     Heart sounds: Normal heart sounds. No murmur.  Pulmonary:     Effort: Pulmonary effort is normal.     Breath sounds: Normal breath sounds.  Musculoskeletal: Normal range of motion.  Skin:    General: Skin is warm and dry.     Capillary Refill: Capillary refill takes less than 2 seconds.  Neurological:     General: No focal deficit present.     Mental Status: She is alert and oriented to person, place, and time.  Psychiatric:        Mood and Affect: Mood normal.        Behavior: Behavior normal.        Thought Content: Thought content normal.        Judgment: Judgment normal.         Assessment And Plan:     1. Type 2 diabetes mellitus without complication, with long-term current use of insulin (HCC)  Chronic, poorly controlled  Tolerating ozempic well  Continue with current medications  Encouraged to limit intake of sugary foods and drinks  Encouraged to increase physical activity to 150 minutes per week  Diabetic foot exam done no abnormal findings.   Diabetic eye exam done 06/09/2017 by Dr. Dione BoozeGroat diagnosed with diabetic retinopathy.  She has not scheduled her eye exam due to the Pandemic, I have advised to make an appt - Lipid panel - Hemoglobin A1c  2. Essential hypertension . Chronic fair control . Continue with telmisartan daily . Increase physical activity  - CMP14 + Anion Gap  3. Other long term (current) drug therapy  - TSH  4. Cramp and spasm  Continues to have muscle cramps, advised to take magnesium every  evening with meals  Continue to drink adequate amounts of water.  5. Need for influenza vaccination  She did not receive the influenza vaccine today - Flu vaccine HIGH DOSE PF (Fluzone High dose)    Arnette FeltsJanece Parneet Glantz, FNP

## 2018-11-22 ENCOUNTER — Encounter: Payer: Self-pay | Admitting: Nurse Practitioner

## 2018-11-22 LAB — CMP14 + ANION GAP
ALT: 16 IU/L (ref 0–32)
AST: 19 IU/L (ref 0–40)
Albumin/Globulin Ratio: 1.6 (ref 1.2–2.2)
Albumin: 4.5 g/dL (ref 3.8–4.8)
Alkaline Phosphatase: 45 IU/L (ref 39–117)
Anion Gap: 14 mmol/L (ref 10.0–18.0)
BUN/Creatinine Ratio: 9 — ABNORMAL LOW (ref 12–28)
BUN: 7 mg/dL — ABNORMAL LOW (ref 8–27)
Bilirubin Total: 0.4 mg/dL (ref 0.0–1.2)
CO2: 26 mmol/L (ref 20–29)
Calcium: 9.2 mg/dL (ref 8.7–10.3)
Chloride: 102 mmol/L (ref 96–106)
Creatinine, Ser: 0.77 mg/dL (ref 0.57–1.00)
GFR calc Af Amer: 93 mL/min/{1.73_m2} (ref >59)
GFR calc non Af Amer: 81 mL/min/{1.73_m2} (ref >59)
Globulin, Total: 2.8 g/dL (ref 1.5–4.5)
Glucose: 169 mg/dL — ABNORMAL HIGH (ref 65–99)
Potassium: 4.1 mmol/L (ref 3.5–5.2)
Sodium: 142 mmol/L (ref 134–144)
Total Protein: 7.3 g/dL (ref 6.0–8.5)

## 2018-11-25 ENCOUNTER — Telehealth: Payer: Self-pay

## 2018-11-28 ENCOUNTER — Other Ambulatory Visit (INDEPENDENT_AMBULATORY_CARE_PROVIDER_SITE_OTHER): Payer: Medicare Other

## 2018-11-28 ENCOUNTER — Other Ambulatory Visit: Payer: Self-pay

## 2018-11-28 DIAGNOSIS — Z794 Long term (current) use of insulin: Secondary | ICD-10-CM

## 2018-11-28 DIAGNOSIS — E119 Type 2 diabetes mellitus without complications: Secondary | ICD-10-CM

## 2018-11-28 DIAGNOSIS — Z23 Encounter for immunization: Secondary | ICD-10-CM

## 2018-11-29 LAB — HEMOGLOBIN A1C
Est. average glucose Bld gHb Est-mCnc: 183 mg/dL
Hgb A1c MFr Bld: 8 % — ABNORMAL HIGH (ref 4.8–5.6)

## 2018-12-07 ENCOUNTER — Telehealth: Payer: Self-pay | Admitting: Nurse Practitioner

## 2018-12-07 NOTE — Chronic Care Management (AMB) (Signed)
Made in error

## 2018-12-15 ENCOUNTER — Telehealth: Payer: Self-pay

## 2018-12-16 ENCOUNTER — Ambulatory Visit (INDEPENDENT_AMBULATORY_CARE_PROVIDER_SITE_OTHER): Payer: Medicare Other | Admitting: Pharmacist

## 2018-12-16 DIAGNOSIS — E119 Type 2 diabetes mellitus without complications: Secondary | ICD-10-CM

## 2018-12-16 DIAGNOSIS — I10 Essential (primary) hypertension: Secondary | ICD-10-CM

## 2018-12-16 DIAGNOSIS — Z794 Long term (current) use of insulin: Secondary | ICD-10-CM | POA: Diagnosis not present

## 2018-12-16 NOTE — Patient Instructions (Signed)
Visit Information  Goals Addressed            This Visit's Progress     Patient Stated   . "I want to get my A1c down" (pt-stated)       Current Barriers:  [name] Knowledge Deficits related to Diabetes and Self Health Management  Nurse Case Manager Clinical Goal(s):  [name] Over the next 90 days, patient will demonstrate improved adherence to prescribed treatment plan for Diabetes as evidenced by patient will report improved adherence to following a Diabetic diet; exercise and taking all Diabetic medications exactly as prescribed 08/12/18: Re-established target goal date to 90 days due to COVID-19 treatment delays   CCM RN CM Interventions:  Completed call with patient on 08/12/18:   Comprehensive DM assessment completed - patient continues to check her BG levels twice daily, reports cutting her Lantus down to half the ordered dose for 2 weeks while waiting on her refill - denies financial hardship with paying for her medications - pt reports lowest BG of 70; highest BG level in the 200's; pt states she believes the quarantine and being less active is causing higher BG levels  Assessed for adherence to following her ADA diet, pt reports adherence "most of the time"  Reinforced importance of total adherence to her prescribed DM treatment plan for best results  Reinforced importance of implementing exercise back into her daily routine as soon as possible to help control her diabetes and to achieve improved overall health   Referral sent to embedded PharmD Vanice Sarah requesting she review patient's diabetic medication regimen - for evaluation   Printed diabetes ed mats to mail to patient for review: Controlling Your Diabetes: Meal Planning Using the Plate Method; Know Your A1C; signs/symptoms of Hypo/Hyperglycemia  Discussed plans with patient for ongoing care management follow up and provided patient with direct contact information for care management team  CCM PharmD Interventions:   Completed call to patient on 12/16/18 Comprehensive medication review performed.  Reviewed medication fill history via insurance claims data.  Patient uses [facility] order to obtain medications.  Patient often stretches insulin while awaiting refill shipment.  Discussed ordering medications at least 1-2 weeks before they are due. . Reviewed & discussed the following diabetes-related information with patient: o Continue checking blood sugars as directed o Follow ADA recommended "diabetes-friendly" diet  (reviewed healthy snack/food options) o Confirmed current DM regimen: Tresiba 20 units qHS, Ozempic 1mg  weekly, Novolog per sliding scale o Discussed insulin/GLP-1 injection technique; Freestyle is working great! Patient enjoys this method vs conventional.  We will be able to check patterns. o PCP encouraged patient to only check FBG, qHS and before meals so that we are able to gauge mealtime coverage and assess true needs. o Reviewed medication purpose/side effects-->patient denies adverse events, denies hypoglycemia, reports FBG 150-180, this AM it was 159 due to late night snack.  Encouraged patient to use the middle to upper end of her Lantus range of 20-26 units qHs.  She has been only taking 20 units a day and reports FBG 150-180.  Would aim to maximize basal insulin, continue optimized GLP-1, and hope to peel back meal time insulin. o Most recent A1c was 8% on 11/30/18.  Will continue to provide counseling and support and next visit.  Will discuss with CCM RN CM. o Patient is now back to work and starting to move around more.  Encouraged patient to walk early in the AM or later in the evening  as able. o Last fill of statin was 90-day supply (unable to see fill dates due to [place]). Patient previously experiencing myopathy.  Encouraged patient to call if muscle pain persist and we can work to adjust.  Will continue to address. o Continue taking all medications  as prescribed by provider   Patient Self Care Activities:   Verbalizes understanding of the education/information provided today  . Self administers medications as prescribed . Attends all scheduled provider appointments . Calls provider office for new concerns or questions    Please see past updates related to this goal by clicking on the "Past Updates" button in the selected goal          The patient verbalized understanding of instructions provided today and declined a print copy of patient instruction materials.   The care management team will reach out to the patient again over the next 4 weeks.  Regina Eck, PharmD, BCPS Clinical Pharmacist, [facility] [place]: [phone]

## 2018-12-16 NOTE — Progress Notes (Signed)
Chronic Care Management  Visit Note  12/16/2018 Name: Jacqueline Bell MRN: [id number] DOB: Aug 11, 1950  Referred by: [name], FNP Reason for referral : Chronic Care Management   Jacqueline Bell is a 68 y.o. year old female who is a primary care patient of [name], [place]. The CCM team was consulted for assistance with chronic disease management and care coordination needs related to HLD DMII  Review of patient status, including review of consultants reports, relevant laboratory and other test results, and collaboration with appropriate care team members and the patient's provider was performed as part of comprehensive patient evaluation and provision of chronic care management services.    I spoke with Jacqueline Bell by telephone today.  Advanced Directives Status: N See Care Plan and Vyn[employer] application for related entries.   Medications: Outpatient Encounter Medications as of 12/16/2018  Medication Sig Note  . cholecalciferol (VITAMIN D) 1000 units tablet Take 1,000 Units by mouth daily.   [name] EPINEPHrine 0.3 mg/0.3 mL IJ SOAJ injection    . fluticasone (FLONASE) 50 MCG/ACT nasal spray Place 1 spray into both nostrils daily.   . insulin degludec (TRESIBA FLEXTOUCH) 100 UNIT/ML SOPN FlexTouch Pen Inject 0.2 mLs (20 Units total) into the skin daily.   [name] levocetirizine (XYZAL) 5 MG tablet Take 1 tablet (5 mg total) by mouth every evening.   . meloxicam (MOBIC) 15 MG tablet Take 1 tablet (15 mg total) by mouth as needed.   . Multiple Vitamin (MULTIVITAMIN) tablet Take 1 tablet by mouth daily.   . Olopatadine HCl (PAZEO) 0.7 % SOLN Place 1 drop into both eyes daily. 05/19/2018: Patient is taking as needed.   . Omega-3 Fatty Acids (FISH OIL PO) Take 2 capsules by mouth daily.    [name] OZEMPIC, 1 MG/DOSE, 2 MG/1.5ML SOPN INJECT 1 MG UNDER THE SKIN ONCE WEEKLY   . POTASSIUM BICARBONATE PO Take 1 tablet by mouth. Patient takes 2 tablets daily   . rosuvastatin (CRESTOR) 5 MG tablet Take 1 tablet  (5 mg total) by mouth daily.   [name] telmisartan (MICARDIS) 20 MG tablet Take 20 mg by mouth daily.   . Vitamin D, Ergocalciferol, (DRISDOL) 1.25 MG (50000 UT) CAPS capsule Take 1 capsule (50,000 Units total) by mouth every 7 (seven) days.    No facility-administered encounter medications on file as of 12/16/2018.      Objective:   Goals Addressed            This Visit's Progress     Patient Stated   . "I want to get my A1c down" (pt-stated)       Current Barriers:  [name] Knowledge Deficits related to Diabetes and Self Health Management  Nurse Case Manager Clinical Goal(s):  [name] Over the next 90 days, patient will demonstrate improved adherence to prescribed treatment plan for Diabetes as evidenced by patient will report improved adherence to following a Diabetic diet; exercise and taking all Diabetic medications exactly as prescribed 08/12/18: Re-established target goal date to 90 days due to COVID-19 treatment delays   CCM RN CM Interventions:  Completed call with patient on 08/12/18:   Comprehensive DM assessment completed - patient continues to check her BG levels twice daily, reports cutting her Lantus down to half the ordered dose for 2 weeks while waiting on her refill - denies financial hardship with paying for her medications - pt reports lowest BG of 70; highest BG level in the 200's; pt states she believes the quarantine and being less active  is causing higher BG levels  Assessed for adherence to following her ADA diet, pt reports adherence "most of the time"  Reinforced importance of total adherence to her prescribed DM treatment plan for best results  Reinforced importance of implementing exercise back into her daily routine as soon as possible to help control her diabetes and to achieve improved overall health   Referral sent to embedded PharmD Vanice Sarah requesting she review patient's diabetic medication regimen - for evaluation   Printed diabetes ed mats to mail to  patient for review: Controlling Your Diabetes: Meal Planning Using the Plate Method; Know Your A1C; signs/symptoms of Hypo/Hyperglycemia  Discussed plans with patient for ongoing care management follow up and provided patient with direct contact information for care management team  CCM PharmD Interventions:  Completed call to patient on 12/16/18 Comprehensive medication review performed.  Reviewed medication fill history via insurance claims data.  Patient uses [facility] mail order to obtain medications.  Patient often stretches insulin while awaiting refill shipment.  Discussed ordering medications at least 1-2 weeks before they are due. . Reviewed & discussed the following diabetes-related information with patient: o Continue checking blood sugars as directed o Follow ADA recommended "diabetes-friendly" diet  (reviewed healthy snack/food options) o Confirmed current DM regimen: Tresiba 20 units qHS, Ozempic 1mg  weekly, Novolog per sliding scale o Discussed insulin/GLP-1 injection technique; Freestyle is working great! Patient enjoys this method vs conventional.  We will be able to check patterns. o PCP encouraged patient to only check FBG, qHS and before meals so that we are able to gauge mealtime coverage and assess true needs. o Reviewed medication purpose/side effects-->patient denies adverse events, denies hypoglycemia, reports FBG 150-180, this AM it was 159 due to late night snack.  Encouraged patient to use the middle to upper end of her Lantus range of 20-26 units qHs.  She has been only taking 20 units a day and reports FBG 150-180.  Would aim to maximize basal insulin, continue optimized GLP-1, and hope to peel back meal time insulin. o Most recent A1c was 8% on 11/30/18.  Will continue to provide counseling and support and next visit.  Will discuss with CCM RN CM. o Patient is now back to work and starting to move around more.  Encouraged patient to walk early  in the AM or later in the evening as able. o Last fill of statin was 90-day supply (unable to see fill dates due to [facility] pharmacy). Patient previously experiencing myopathy.  Encouraged patient to call if muscle pain persist and we can work to adjust.  Will continue to address. o Continue taking all medications as prescribed by provider   Patient Self Care Activities:   Verbalizes understanding of the education/information provided today  . Self administers medications as prescribed . Attends all scheduled provider appointments . Calls provider office for new concerns or questions    Please see past updates related to this goal by clicking on the "Past Updates" button in the selected goal           Plan:   The care management team will reach out to the patient again over the next 4 weeks.  [facility], PharmD, BCPS Clinical Pharmacist, [facility]  [facility]  Direct Dial: [phone]

## 2018-12-20 ENCOUNTER — Telehealth: Payer: Medicare Other

## 2018-12-20 ENCOUNTER — Ambulatory Visit: Payer: Self-pay

## 2018-12-20 ENCOUNTER — Other Ambulatory Visit: Payer: Self-pay

## 2018-12-20 ENCOUNTER — Telehealth: Payer: Self-pay

## 2018-12-20 DIAGNOSIS — G4700 Insomnia, unspecified: Secondary | ICD-10-CM

## 2018-12-20 DIAGNOSIS — I10 Essential (primary) hypertension: Secondary | ICD-10-CM

## 2018-12-20 DIAGNOSIS — E119 Type 2 diabetes mellitus without complications: Secondary | ICD-10-CM

## 2018-12-21 NOTE — Chronic Care Management (AMB) (Signed)
Chronic Care Management   Follow Up Note   12/20/2018 Name: Jacqueline Bell MRN: [id number] DOB: Dec 21, 1950  Referred by: [name], FNP Reason for referral : Chronic Care Management (CCM RNCM Telephone Follow up)   RONNELL MAKAREWICZ is a 68 y.o. year old female who is a primary care patient of [name], [place]. The CCM team was consulted for assistance with chronic disease management and care coordination needs.    Review of patient status, including review of consultants reports, relevant laboratory and other test results, and collaboration with appropriate care team members and the patient's provider was performed as part of comprehensive patient evaluation and provision of chronic care management services.    SDOH (Social Determinants of Health) screening performed today: None. See Care Plan for related entries.   Advanced Directives Status: N See Care Plan and [employer] application for related entries.  I spoke with [name] by telephone today to follow up on her Diabetes.   Outpatient Encounter Medications as of 12/20/2018  Medication Sig Note  . insulin degludec (TRESIBA FLEXTOUCH) 100 UNIT/ML SOPN FlexTouch Pen Inject 0.2 mLs (20 Units total) into the skin daily.   [name] OZEMPIC, 1 MG/DOSE, 2 MG/1.5ML SOPN INJECT 1 MG UNDER THE SKIN ONCE WEEKLY   . rosuvastatin (CRESTOR) 5 MG tablet Take 1 tablet (5 mg total) by mouth daily.   . Vitamin D, Ergocalciferol, (DRISDOL) 1.25 MG (50000 UT) CAPS capsule Take 1 capsule (50,000 Units total) by mouth every 7 (seven) days.   . cholecalciferol (VITAMIN D) 1000 units tablet Take 1,000 Units by mouth daily.   [name] EPINEPHrine 0.3 mg/0.3 mL IJ SOAJ injection    . fluticasone (FLONASE) 50 MCG/ACT nasal spray Place 1 spray into both nostrils daily.   [name] levocetirizine (XYZAL) 5 MG tablet Take 1 tablet (5 mg total) by mouth every evening.   . meloxicam (MOBIC) 15 MG tablet Take 1 tablet (15 mg total) by mouth as needed.   . Multiple Vitamin  (MULTIVITAMIN) tablet Take 1 tablet by mouth daily.   . Olopatadine HCl (PAZEO) 0.7 % SOLN Place 1 drop into both eyes daily. 05/19/2018: Patient is taking as needed.   . Omega-3 Fatty Acids (FISH OIL PO) Take 2 capsules by mouth daily.    [name] POTASSIUM BICARBONATE PO Take 1 tablet by mouth. Patient takes 2 tablets daily   . telmisartan (MICARDIS) 20 MG tablet Take 20 mg by mouth daily.    No facility-administered encounter medications on file as of 12/20/2018.      Goals Addressed      Patient Stated   . "I want to get my A1c down" (pt-stated)   Not on track    Current Barriers:  [name] Knowledge Deficits related to Diabetes and Self Health Management  Nurse Case Manager Clinical Goal(s):  [name] Over the next 90 days, patient will demonstrate improved adherence to prescribed treatment plan for Diabetes as evidenced by patient will report improved adherence to following a Diabetic diet; exercise and taking all Diabetic medications exactly as prescribed 08/12/18: Re-established target goal date to 90 days due to COVID-19 treatment delays Goal Not Met . 12/20/18 Over the next 90 days, patient will report taking her medications exactly as prescribed w/o missed doses, she will report adherence with using the plate method and portion control for meals and will report spending at least 50 minutes per week exercising  CCM RN CM Interventions:  Completed call with patient on 12/20/18:   Comprehensive DM assessment completed -  patient continues to check her BG levels twice daily, reports cutting her Lantus down to half the ordered dose for 2 weeks while waiting on her refill - denies financial hardship with paying for her medications - pt reports lowest BG of 70; highest BG level in the 200's; pt states she believes the quarantine and being less active is causing higher BG levels  Educated patient on target A1C of <7.0; discussed current A1C is 8.0; educated patient on importance of lowering A1C to help prevent  diabetes associated complications  Assessed for adherence to following her ADA diet, pt reports adherence "most of the time"  Reinforced importance of total adherence to her prescribed DM treatment plan for best results  Reinforced importance of implementing exercise back into her daily routine as soon as possible to help control her diabetes and to achieve improved overall health - discussed ADA recommendations of 150 minutes per week (30 minutes x 5 days per week)  Printed diabetes ed mats to mail to patient for review: Managing Your Diabetes; Improving Your Meal Habits; Losing Weight: Getting started; Carb Counting; Carbohydrate Choices  Discussed plans with patient for ongoing care management follow up and provided patient with direct contact information for care management team  CCM PharmD Interventions:  Completed call to patient on 12/16/18 Comprehensive medication review performed.  Reviewed medication fill history via insurance claims data.  Patient uses [facility] order to obtain medications.  Patient often stretches insulin while awaiting refill shipment.  Discussed ordering medications at least 1-2 weeks before they are due. . Reviewed & discussed the following diabetes-related information with patient: o Continue checking blood sugars as directed o Follow ADA recommended "diabetes-friendly" diet  (reviewed healthy snack/food options) o Confirmed current DM regimen: Tresiba 20 units qHS, Ozempic 67m weekly, Novolog per sliding scale o Discussed insulin/GLP-1 injection technique; Freestyle lElenor Legatois working great! Patient enjoys this method vs conventional.  We will be able to check patterns. o PCP encouraged patient to only check FBG, qHS and before meals so that we are able to gauge mealtime coverage and assess true needs. o Reviewed medication purpose/side effects-->patient denies adverse events, denies hypoglycemia, reports FBG 150-180, this AM it was 159  due to late night snack.  Encouraged patient to use the middle to upper end of her Lantus range of 20-26 units qHs.  She has been only taking 20 units a day and reports FBG 150-180.  Would aim to maximize basal insulin, continue optimized GLP-1, and hope to peel back meal time insulin. o Most recent A1c was 8% on 11/30/18.  Will continue to provide counseling and support and next visit.  Will discuss with CCM RN CM. o Patient is now back to work and starting to move around more.  Encouraged patient to walk early in the AM or later in the evening as able. o Last fill of statin was 90-day supply (unable to see fill dates due to [place]. Patient previously experiencing myopathy.  Encouraged patient to call if muscle pain persist and we can work to adjust.  Will continue to address. o Continue taking all medications as prescribed by provider   Patient Self Care Activities:   Verbalizes understanding of the education/information provided today  . Self administers medications as prescribed . Attends all scheduled provider appointments . Calls provider office for new concerns or questions   Please see past updates related to this goal by clicking on the "Past Updates" button in the selected goal  Telephone follow up appointment with care management team member scheduled for: 01/10/19   Barb Merino, RN, BSN, CCM Care Management Coordinator [place] [facility]  Direct Phone: [phone]

## 2018-12-21 NOTE — Patient Instructions (Signed)
Visit Information  Goals Addressed      Patient Stated   . "I want to get my A1c down" (pt-stated)   Not on track    Current Barriers:  [name] Knowledge Deficits related to Diabetes and Self Health Management  Nurse Case Manager Clinical Goal(s):  [name] Over the next 90 days, patient will demonstrate improved adherence to prescribed treatment plan for Diabetes as evidenced by patient will report improved adherence to following a Diabetic diet; exercise and taking all Diabetic medications exactly as prescribed 08/12/18: Re-established target goal date to 90 days due to COVID-19 treatment delays Goal Not Met . 12/20/18 Over the next 90 days, patient will report taking her medications exactly as prescribed w/o missed doses, she will report adherence with using the plate method and portion control for meals and will report spending at least 50 minutes per week exercising  CCM RN CM Interventions:  Completed call with patient on 12/20/18:   Comprehensive DM assessment completed - patient continues to check her BG levels twice daily, reports cutting her Lantus down to half the ordered dose for 2 weeks while waiting on her refill - denies financial hardship with paying for her medications - pt reports lowest BG of 70; highest BG level in the 200's; pt states she believes the quarantine and being less active is causing higher BG levels  Educated patient on target A1C of <7.0; discussed current A1C is 8.0; educated patient on importance of lowering A1C to help prevent diabetes associated complications  Assessed for adherence to following her ADA diet, pt reports adherence "most of the time"  Reinforced importance of total adherence to her prescribed DM treatment plan for best results  Reinforced importance of implementing exercise back into her daily routine as soon as possible to help control her diabetes and to achieve improved overall health - discussed ADA recommendations of 150 minutes per week (30  minutes x 5 days per week)  Printed diabetes ed mats to mail to patient for review: Managing Your Diabetes; Improving Your Meal Habits; Losing Weight: Getting started; Carb Counting; Carbohydrate Choices  Discussed plans with patient for ongoing care management follow up and provided patient with direct contact information for care management team  CCM PharmD Interventions:  Completed call to patient on 12/16/18 Comprehensive medication review performed.  Reviewed medication fill history via insurance claims data.  Patient uses [facility] order to obtain medications.  Patient often stretches insulin while awaiting refill shipment.  Discussed ordering medications at least 1-2 weeks before they are due. . Reviewed & discussed the following diabetes-related information with patient: o Continue checking blood sugars as directed o Follow ADA recommended "diabetes-friendly" diet  (reviewed healthy snack/food options) o Confirmed current DM regimen: Tresiba 20 units qHS, Ozempic 16m weekly, Novolog per sliding scale o Discussed insulin/GLP-1 injection technique; Freestyle lElenor Legatois working great! Patient enjoys this method vs conventional.  We will be able to check patterns. o PCP encouraged patient to only check FBG, qHS and before meals so that we are able to gauge mealtime coverage and assess true needs. o Reviewed medication purpose/side effects-->patient denies adverse events, denies hypoglycemia, reports FBG 150-180, this AM it was 159 due to late night snack.  Encouraged patient to use the middle to upper end of her Lantus range of 20-26 units qHs.  She has been only taking 20 units a day and reports FBG 150-180.  Would aim to maximize basal insulin, continue optimized GLP-1, and hope to peel  back meal time insulin. o Most recent A1c was 8% on 11/30/18.  Will continue to provide counseling and support and next visit.  Will discuss with CCM RN CM. o Patient is now back to work  and starting to move around more.  Encouraged patient to walk early in the AM or later in the evening as able. o Last fill of statin was 90-day supply (unable to see fill dates due to [place]). Patient previously experiencing myopathy.  Encouraged patient to call if muscle pain persist and we can work to adjust.  Will continue to address. o Continue taking all medications as prescribed by provider   Patient Self Care Activities:   Verbalizes understanding of the education/information provided today  . Self administers medications as prescribed . Attends all scheduled provider appointments . Calls provider office for new concerns or questions    Please see past updates related to this goal by clicking on the "Past Updates" button in the selected goal          The patient verbalized understanding of instructions provided today and declined a print copy of patient instruction materials.   Telephone follow up appointment with care management team member scheduled for: 01/10/19  Barb Merino, RN, BSN, CCM Care Management Coordinator [place] [facility]  Direct Phone: [phone]

## 2018-12-26 ENCOUNTER — Other Ambulatory Visit: Payer: Self-pay

## 2018-12-26 ENCOUNTER — Other Ambulatory Visit: Payer: Self-pay | Admitting: Nurse Practitioner

## 2018-12-26 DIAGNOSIS — Z20822 Contact with and (suspected) exposure to covid-19: Secondary | ICD-10-CM

## 2018-12-26 DIAGNOSIS — E559 Vitamin D deficiency, unspecified: Secondary | ICD-10-CM

## 2018-12-26 DIAGNOSIS — E119 Type 2 diabetes mellitus without complications: Secondary | ICD-10-CM

## 2018-12-26 DIAGNOSIS — Z794 Long term (current) use of insulin: Secondary | ICD-10-CM

## 2018-12-28 LAB — NOVEL CORONAVIRUS, NAA: SARS-CoV-2, NAA: NOT DETECTED

## 2018-12-29 ENCOUNTER — Encounter: Payer: Self-pay | Admitting: Nurse Practitioner

## 2019-01-10 ENCOUNTER — Telehealth: Payer: Self-pay

## 2019-01-12 ENCOUNTER — Telehealth: Payer: Self-pay

## 2019-01-17 ENCOUNTER — Telehealth: Payer: Medicare Other | Admitting: Pharmacist

## 2019-01-23 ENCOUNTER — Ambulatory Visit (INDEPENDENT_AMBULATORY_CARE_PROVIDER_SITE_OTHER): Payer: Medicare Other | Admitting: Nurse Practitioner

## 2019-01-23 ENCOUNTER — Encounter: Payer: Self-pay | Admitting: Nurse Practitioner

## 2019-01-23 ENCOUNTER — Other Ambulatory Visit: Payer: Self-pay

## 2019-01-23 ENCOUNTER — Ambulatory Visit: Payer: Medicare Other | Admitting: Pharmacist

## 2019-01-23 VITALS — BP 132/80 | HR 77 | Temp 98.8°F | Ht 66.0 in | Wt 198.8 lb

## 2019-01-23 DIAGNOSIS — J3089 Other allergic rhinitis: Secondary | ICD-10-CM

## 2019-01-23 DIAGNOSIS — Z794 Long term (current) use of insulin: Secondary | ICD-10-CM

## 2019-01-23 DIAGNOSIS — E119 Type 2 diabetes mellitus without complications: Secondary | ICD-10-CM

## 2019-01-23 DIAGNOSIS — I10 Essential (primary) hypertension: Secondary | ICD-10-CM

## 2019-01-23 MED ORDER — LORATADINE 10 MG PO TABS: 10.0000 mg | ORAL_TABLET | Freq: Every day | ORAL | 1 refills | Status: DC

## 2019-01-23 MED ORDER — TRESIBA FLEXTOUCH 100 UNIT/ML ~~LOC~~ SOPN: 20.0000 [IU] | PEN_INJECTOR | Freq: Every day | SUBCUTANEOUS | 1 refills | Status: DC

## 2019-01-23 NOTE — Progress Notes (Signed)
Subjective:     Patient ID: Jacqueline Bell , female    DOB: 05/22/1950 , 68 y.o.   MRN: [id number]   Chief Complaint  Patient presents with  . Diabetes    HPI  She is taking Tresiba (20 units) and Ozempic 1 mg (weekly).  She has used the Lantus 26 units over the last week due to running out.    Wt Readings from Last 3 Encounters: 01/23/19 : 198 lb 12.8 oz (90.2 kg) 11/21/18 : 197 lb 12.8 oz (89.7 kg) 09/19/18 : 201 lb 3.2 oz (91.3 kg)   Diabetes She presents for her follow-up diabetic visit. She has type 2 diabetes mellitus. Her disease course has been stable. Pertinent negatives for hypoglycemia include no confusion, dizziness or headaches. There are no diabetic associated symptoms. Pertinent negatives for diabetes include no chest pain, no fatigue, no polydipsia, no polyphagia and no polyuria. Symptoms are stable. There are no diabetic complications. Risk factors for coronary artery disease include sedentary lifestyle. Current diabetic treatment includes oral agent (dual therapy). She is compliant with treatment all of the time. Her weight is stable. She is following a generally healthy diet. When asked about meal planning, she reported none. She has not had a previous visit with a dietitian. She rarely participates in exercise. There is no change in her home blood glucose trend. (She is now using the [place].  Her blood sugars averaged 128) She does not see a podiatrist.Eye exam is current.  Hypertension This is a chronic problem. The current episode started more than 1 year ago. The problem is unchanged. The problem is uncontrolled. Pertinent negatives include no anxiety, chest pain, headaches or palpitations. Risk factors for coronary artery disease include obesity, sedentary lifestyle and diabetes mellitus. Past treatments include ACE inhibitors. The current treatment provides mild improvement. There are no compliance problems.  There is no history of angina. There is no history of  chronic renal disease.     Past Medical History:  Diagnosis Date  . Diabetes mellitus without complication ([place])      No family history on file.   Current Outpatient Medications:  .  cholecalciferol (VITAMIN D) 1000 units tablet, Take 1,000 Units by mouth daily., Disp: , Rfl:  .  EPINEPHrine 0.3 mg/0.3 mL IJ SOAJ injection, , Disp: , Rfl:  .  fluticasone (FLONASE) 50 MCG/ACT nasal spray, Place 1 spray into both nostrils daily., Disp: 16 g, Rfl: 5 .  insulin degludec (TRESIBA FLEXTOUCH) 100 UNIT/ML SOPN FlexTouch Pen, Inject 0.2 mLs (20 Units total) into the skin daily., Disp: 15 pen, Rfl: 1 .  levocetirizine (XYZAL) 5 MG tablet, Take 1 tablet (5 mg total) by mouth every evening., Disp: 90 tablet, Rfl: 1 .  meloxicam (MOBIC) 15 MG tablet, Take 1 tablet (15 mg total) by mouth as needed., Disp: 90 tablet, Rfl: 0 .  Multiple Vitamin (MULTIVITAMIN) tablet, Take 1 tablet by mouth daily., Disp: , Rfl:  .  Olopatadine HCl (PAZEO) 0.7 % SOLN, Place 1 drop into both eyes daily., Disp: 1 Bottle, Rfl: 5 .  Omega-3 Fatty Acids (FISH OIL PO), Take 2 capsules by mouth daily. , Disp: , Rfl:  .  OZEMPIC, 1 MG/DOSE, 2 MG/1.5ML SOPN, INJECT 1 MG UNDER THE SKIN ONCE WEEKLY, Disp: 9 mL, Rfl: 3 .  POTASSIUM BICARBONATE PO, Take 1 tablet by mouth. Patient takes 2 tablets daily, Disp: , Rfl:  .  rosuvastatin (CRESTOR) 5 MG tablet, TAKE 1 TABLET DAILY, Disp: 90 tablet, Rfl: 3 .  telmisartan (MICARDIS) 20 MG tablet, Take 20 mg by mouth daily., Disp: , Rfl:  .  Vitamin D, Ergocalciferol, (DRISDOL) 1.25 MG (50000 UT) CAPS capsule, TAKE 1 CAPSULE EVERY 7 DAYS, Disp: 12 capsule, Rfl: 3   Allergies  Allergen Reactions  . Iodine Hives  . Shellfish Allergy Hives     Review of Systems  Constitutional: Negative.  Negative for fatigue.  Respiratory: Negative.  Negative for cough.   Cardiovascular: Negative.  Negative for chest pain, palpitations and leg swelling.  Endocrine: Negative for polydipsia, polyphagia and  polyuria.  Genitourinary: Negative.   Musculoskeletal: Negative.   Neurological: Negative for dizziness and headaches.  Psychiatric/Behavioral: Negative for confusion.     Today's Vitals   01/23/19 1146  BP: 132/80  Pulse: 77  Temp: 98.8 F (37.1 C)  TempSrc: Oral  Weight: 198 lb 12.8 oz (90.2 kg)  Height: 5\' 6"  (1.676 m)  PainSc: 0-No pain   Body mass index is 32.09 kg/m.   Objective:  Physical Exam Vitals signs reviewed.  Constitutional:      General: She is not in acute distress.    Appearance: Normal appearance. She is obese.  Cardiovascular:     Rate and Rhythm: Normal rate and regular rhythm.     Pulses: Normal pulses.     Heart sounds: Normal heart sounds. No murmur.  Pulmonary:     Effort: Pulmonary effort is normal. No respiratory distress.     Breath sounds: Normal breath sounds.  Musculoskeletal: Normal range of motion.  Skin:    General: Skin is warm and dry.     Capillary Refill: Capillary refill takes less than 2 seconds.  Neurological:     General: No focal deficit present.     Mental Status: She is alert and oriented to person, place, and time.  Psychiatric:        Mood and Affect: Mood normal.        Behavior: Behavior normal.        Thought Content: Thought content normal.        Judgment: Judgment normal.         Assessment And Plan:     1. Type 2 diabetes mellitus without complication, with long-term current use of insulin (HCC)  Chronic, poorly controlled  Tolerating ozempic well  Continue with current medications  Encouraged to limit intake of sugary foods and drinks  Encouraged to increase physical activity to 150 minutes per week  Diabetic foot exam done no abnormal findings.   Diabetic eye exam done 06/09/2017 by Dr. 08/09/2017 diagnosed with diabetic retinopathy.  She has not scheduled her eye exam due to the Pandemic, I have advised to make an appt - Lipid panel - Hemoglobin A1c  2. Essential hypertension . Chronic,  stable, . Continue with current medications  - CMP14 + Anion Gap  3. Other long term (current) drug therapy  - TSH       Dione Booze, FNP

## 2019-01-24 LAB — CMP14+EGFR
ALT: 13 IU/L (ref 0–32)
AST: 15 IU/L (ref 0–40)
Albumin/Globulin Ratio: 1.4 (ref 1.2–2.2)
Albumin: 4.3 g/dL (ref 3.8–4.8)
Alkaline Phosphatase: 44 IU/L (ref 39–117)
BUN/Creatinine Ratio: 9 — ABNORMAL LOW (ref 12–28)
BUN: 7 mg/dL — ABNORMAL LOW (ref 8–27)
Bilirubin Total: 0.3 mg/dL (ref 0.0–1.2)
CO2: 24 mmol/L (ref 20–29)
Calcium: 9.1 mg/dL (ref 8.7–10.3)
Chloride: 102 mmol/L (ref 96–106)
Creatinine, Ser: 0.79 mg/dL (ref 0.57–1.00)
GFR calc Af Amer: 90 mL/min/{1.73_m2} (ref >59)
GFR calc non Af Amer: 78 mL/min/{1.73_m2} (ref >59)
Globulin, Total: 3 g/dL (ref 1.5–4.5)
Glucose: 87 mg/dL (ref 65–99)
Potassium: 4.1 mmol/L (ref 3.5–5.2)
Sodium: 139 mmol/L (ref 134–144)
Total Protein: 7.3 g/dL (ref 6.0–8.5)

## 2019-01-24 LAB — HEMOGLOBIN A1C
Est. average glucose Bld gHb Est-mCnc: 186 mg/dL
Hgb A1c MFr Bld: 8.1 % — ABNORMAL HIGH (ref 4.8–5.6)

## 2019-01-27 ENCOUNTER — Other Ambulatory Visit: Payer: Self-pay | Admitting: Nurse Practitioner

## 2019-01-31 NOTE — Progress Notes (Signed)
Okay, I have given Jacqueline Bell her number.

## 2019-03-15 ENCOUNTER — Ambulatory Visit: Payer: Self-pay

## 2019-03-15 ENCOUNTER — Telehealth: Payer: Self-pay

## 2019-03-15 DIAGNOSIS — E119 Type 2 diabetes mellitus without complications: Secondary | ICD-10-CM

## 2019-03-15 DIAGNOSIS — I10 Essential (primary) hypertension: Secondary | ICD-10-CM

## 2019-03-15 DIAGNOSIS — G4700 Insomnia, unspecified: Secondary | ICD-10-CM

## 2019-03-15 DIAGNOSIS — Z794 Long term (current) use of insulin: Secondary | ICD-10-CM

## 2019-03-15 NOTE — Chronic Care Management (AMB) (Signed)
  Chronic Care Management   Outreach Note  03/15/2019 Name: Jacqueline Bell MRN: [id number] DOB: 10/14/1950  Referred by: Arnette Felts, FNP Reason for referral : Chronic Care Management (CCM RNCM Telephone Follow up )   An unsuccessful telephone outreach was attempted today. The patient was referred to the case management team by Arnette Felts FNP for assistance with care management and care coordination.   Follow Up Plan: Telephone follow up appointment with care management team member scheduled for: 03/23/19  Delsa Sale, RN, BSN, CCM Care Management Coordinator [facility]  Direct Phone: [phone]

## 2019-03-23 ENCOUNTER — Ambulatory Visit: Payer: Self-pay

## 2019-03-23 ENCOUNTER — Telehealth: Payer: Self-pay

## 2019-03-23 DIAGNOSIS — Z794 Long term (current) use of insulin: Secondary | ICD-10-CM

## 2019-03-23 DIAGNOSIS — E119 Type 2 diabetes mellitus without complications: Secondary | ICD-10-CM

## 2019-03-23 NOTE — Chronic Care Management (AMB) (Signed)
  Chronic Care Management   Outreach Note  03/23/2019 Name: Jacqueline Bell MRN: [id number] DOB: [birth year]  Referred by: Arnette Felts, FNP Reason for referral : Care Coordination   SW placed an unsuccessful outbound call to the patient to assist with care coordination needs. SW left a HIPAA compliant voice message requesting a return call.  Follow Up Plan: A member of the care management team will follow up with the patient over the next 14 days.  Bevelyn Ngo, BSW, CDP Social Worker, Certified Dementia Practitioner [facility] Care Management [phone]

## 2019-03-31 ENCOUNTER — Telehealth: Payer: Self-pay

## 2019-04-07 DIAGNOSIS — Z23 Encounter for immunization: Secondary | ICD-10-CM | POA: Diagnosis not present

## 2019-04-24 ENCOUNTER — Ambulatory Visit: Payer: Medicare Other | Admitting: Pharmacist

## 2019-04-24 DIAGNOSIS — Z794 Long term (current) use of insulin: Secondary | ICD-10-CM

## 2019-04-24 DIAGNOSIS — E119 Type 2 diabetes mellitus without complications: Secondary | ICD-10-CM

## 2019-04-25 ENCOUNTER — Telehealth: Payer: Self-pay

## 2019-05-01 DIAGNOSIS — Z23 Encounter for immunization: Secondary | ICD-10-CM | POA: Diagnosis not present

## 2019-05-01 NOTE — Progress Notes (Signed)
  Chronic Care Management   Outreach Note  04/24/2019 Name: Jacqueline Bell MRN: [id number] DOB: 08-11-1950  Referred by: Arnette Felts, FNP Reason for referral : Chronic Care Management   An unsuccessful telephone outreach was attempted today. The patient was referred to the case management team for assistance with care management and care coordination.   Follow Up Plan: A HIPPA compliant phone message was left for the patient providing contact information and requesting a return call.  The care management team will reach out to the patient again over the next 30 days.   SIGNATURE Kieth Brightly, PharmD, BCPS Clinical Pharmacist, [facility]  [facility]  Direct Dial: [phone]

## 2019-05-09 ENCOUNTER — Other Ambulatory Visit: Payer: Self-pay | Admitting: Allergy and Immunology

## 2019-05-09 ENCOUNTER — Ambulatory Visit (INDEPENDENT_AMBULATORY_CARE_PROVIDER_SITE_OTHER): Payer: Medicare Other

## 2019-05-09 ENCOUNTER — Other Ambulatory Visit: Payer: Self-pay

## 2019-05-09 ENCOUNTER — Telehealth: Payer: Medicare Other

## 2019-05-09 DIAGNOSIS — Z794 Long term (current) use of insulin: Secondary | ICD-10-CM

## 2019-05-09 DIAGNOSIS — I10 Essential (primary) hypertension: Secondary | ICD-10-CM

## 2019-05-09 DIAGNOSIS — G4700 Insomnia, unspecified: Secondary | ICD-10-CM

## 2019-05-09 DIAGNOSIS — E119 Type 2 diabetes mellitus without complications: Secondary | ICD-10-CM

## 2019-05-10 ENCOUNTER — Other Ambulatory Visit: Payer: Self-pay

## 2019-05-10 DIAGNOSIS — J3089 Other allergic rhinitis: Secondary | ICD-10-CM

## 2019-05-10 MED ORDER — LORATADINE 10 MG PO TABS: 10.0000 mg | ORAL_TABLET | Freq: Every day | ORAL | 1 refills | Status: DC

## 2019-05-11 NOTE — Patient Instructions (Addendum)
Visit Information  Goals Addressed      Patient Stated   . "I want to get my A1c down" (pt-stated)       Current Barriers:  . Knowledge Deficits related to Diabetes disease process and Self Health Management . Chronic Disease Management support and education needs related to DMII, HTN, Insomnia  Nurse Case Manager Clinical Goal(s):  . Over the next 90 days, patient will demonstrate improved adherence to prescribed treatment plan for Diabetes as evidenced by patient will report improved adherence to following a Diabetic diet; exercise and taking all Diabetic medications exactly as prescribed 08/12/18: Re-established target goal date to 90 days due to COVID-19 treatment delays Goal Met . 12/20/18 Over the next 90 days, patient will report taking her medications exactly as prescribed w/o missed doses, she will report adherence with using the plate method and portion control for meals and will report spending at least 50 minutes per week exercising Goal Not Met, inactivity due to COVID . 05/11/19 New Over the next 90 days, patient will improve her Diabetes Self Health management AEB patient will verbalize having a routine exercise regimen, performing 150 minutes per week per ADA recommendations . 05/11/19 New Over the next 90 days, patient will maintain and or lower her A1C <8.0   CCM RN CM Interventions:  Completed call with patient on 05/11/19   Comprehensive DM assessment completed - patient continues to check her BG levels twice daily,reports FBS runs <130 on average  Determined patient does have some low's below 80 but not frequently, usually occurs when patient skips meals   Reinforced daily glycemic control with consistent FBS 80-130 and < 190 after meals; Reiterated 15'-15 rule for hypoglycemic events  Reinforced target goal for A1C of <7.0; discussed current A1C is 8.1; educated patient on importance of lowering A1C to help prevent diabetes associated complications  Reviewed  medication regimen for DM, reviewed indication, dosage and frequency, patient reports adherence with no noted SE   Assessed for adherence to following her ADA diet, pt reports adherence "most of the time"  Reinforced importance of total adherence to her prescribed DM treatment plan for best results  Reinforced importance of implementing exercise back into her daily routine as soon as possible to help control her diabetes and to achieve improved overall health - discussed ADA recommendations of 150 minutes per week (30 minutes x 5 days per week)  Printed diabetes ed mats to mail to patient for review: signs/symptoms of hypo/hyperglycemia; Life's Simple 7, 6 Tips to be Water Wise, Diabetes Zone Safety Tool   Discussed plans with patient for ongoing care management follow up and provided patient with direct contact information for care management team  Patient Self Care Activities:   Verbalizes understanding of the education/information provided today  . Self administers medications as prescribed . Attends all scheduled provider appointments . Calls provider office for new concerns or questions   Please see past updates related to this goal by clicking on the "Past Updates" button in the selected goal        Patient verbalizes understanding of instructions provided today.   Telephone follow up appointment with care management team member scheduled for: 07/06/19  Angel Little, RN, BSN, CCM Care Management Coordinator [facility]  Direct Phone: [phone]    

## 2019-05-11 NOTE — Chronic Care Management (AMB) (Signed)
Chronic Care Management   Follow Up Note   05/11/2019 Name: Jacqueline Bell MRN: [id number] DOB: [birth year]  Referred by: [name], FNP Reason for referral : Chronic Care Management ([facility] Call - DM )   Jacqueline Bell is a 69 y.o. year old female who is a primary care patient of [name], [place]. The CCM team was consulted for assistance with chronic disease management and care coordination needs.    Review of patient status, including review of consultants reports, relevant laboratory and other test results, and collaboration with appropriate care team members and the patient's provider was performed as part of comprehensive patient evaluation and provision of chronic care management services.    SDOH (Social Determinants of Health) assessments performed: No See Care Plan activities for detailed interventions related to [place])   Placed outbound call to patient to assess for CCM RN CM needs.     Outpatient Encounter Medications as of 05/09/2019  Medication Sig Note  . cholecalciferol (VITAMIN D) 1000 units tablet Take 1,000 Units by mouth daily.   Jacqueline Bell EPINEPHrine 0.3 mg/0.3 mL IJ SOAJ injection    . fluticasone (FLONASE) 50 MCG/ACT nasal spray USE 1 SPRAY IN EACH NOSTRIL DAILY   . insulin degludec (TRESIBA FLEXTOUCH) 100 UNIT/ML SOPN FlexTouch Pen Inject 0.2 mLs (20 Units total) into the skin daily.   . meloxicam (MOBIC) 15 MG tablet TAKE 1 TABLET AS NEEDED   . Multiple Vitamin (MULTIVITAMIN) tablet Take 1 tablet by mouth daily.   . Olopatadine HCl (PAZEO) 0.7 % SOLN Place 1 drop into both eyes daily. 05/19/2018: Patient is taking as needed.   . Omega-3 Fatty Acids (FISH OIL PO) Take 2 capsules by mouth daily.    Jacqueline Bell OZEMPIC, 1 MG/DOSE, 2 MG/1.5ML SOPN INJECT 1 MG UNDER THE SKIN ONCE WEEKLY   . POTASSIUM BICARBONATE PO Take 1 tablet by mouth. Patient takes 2 tablets daily   . rosuvastatin (CRESTOR) 5 MG tablet TAKE 1 TABLET DAILY   . telmisartan (MICARDIS) 20 MG tablet Take 20 mg by  mouth daily.   . Vitamin D, Ergocalciferol, (DRISDOL) 1.25 MG (50000 UT) CAPS capsule TAKE 1 CAPSULE EVERY 7 DAYS   . [DISCONTINUED] loratadine (CLARITIN) 10 MG tablet Take 1 tablet (10 mg total) by mouth daily.    No facility-administered encounter medications on file as of 05/09/2019.     Objective:  Lab Results  Component Value Date   HGBA1C 8.1 (H) 01/23/2019   HGBA1C 8.0 (H) 11/28/2018   HGBA1C 8.0 (H) 09/19/2018   Lab Results  Component Value Date   MICROALBUR 30 05/05/2018   LDLCALC 76 09/19/2018   CREATININE 0.79 01/23/2019   BP Readings from Last 3 Encounters:  01/23/19 132/80  11/21/18 140/68  09/19/18 (!) 148/72    Goals Addressed      Patient Stated   . "I want to get my A1c down" (pt-stated)       Current Barriers:  Jacqueline Bell Knowledge Deficits related to Diabetes disease process and Self Health Management . Chronic Disease Management support and education needs related to DMII, HTN, Insomnia  Nurse Case Manager Clinical Goal(s):  Jacqueline Bell Over the next 90 days, patient will demonstrate improved adherence to prescribed treatment plan for Diabetes as evidenced by patient will report improved adherence to following a Diabetic diet; exercise and taking all Diabetic medications exactly as prescribed 08/12/18: Re-established target goal date to 90 days due to COVID-19 treatment delays Goal Met . 12/20/18 Over the next 90 days, patient  will report taking her medications exactly as prescribed w/o missed doses, she will report adherence with using the plate method and portion control for meals and will report spending at least 50 minutes per week exercising Goal Not Met, inactivity due to COVID . 05/11/19 New Over the next 90 days, patient will improve her Diabetes Self Health management AEB patient will verbalize having a routine exercise regimen, performing 150 minutes per week per ADA recommendations . 05/11/19 New Over the next 90 days, patient will maintain and or lower her A1C <8.0    CCM RN CM Interventions:  Completed call with patient on 05/11/19   Comprehensive DM assessment completed - patient continues to check her BG levels twice daily,reports FBS runs <130 on average  Determined patient does have some low's below 80 but not frequently, usually occurs when patient skips meals   Reinforced daily glycemic control with consistent FBS 80-130 and < 190 after meals; Reiterated 15'-15 rule for hypoglycemic events  Reinforced target goal for A1C of <7.0; discussed current A1C is 8.1; educated patient on importance of lowering A1C to help prevent diabetes associated complications  Reviewed medication regimen for DM, reviewed indication, dosage and frequency, patient reports adherence with no noted SE   Assessed for adherence to following her ADA diet, pt reports adherence "most of the time"  Reinforced importance of total adherence to her prescribed DM treatment plan for best results  Reinforced importance of implementing exercise back into her daily routine as soon as possible to help control her diabetes and to achieve improved overall health - discussed ADA recommendations of 150 minutes per week (30 minutes x 5 days per week)  Printed diabetes ed mats to mail to patient for review: signs/symptoms of hypo/hyperglycemia; Life's Simple 7, 6 Tips to be Water Wise, Diabetes Zone Safety Tool   Discussed plans with patient for ongoing care management follow up and provided patient with direct contact information for care management team  Patient Self Care Activities:   Verbalizes understanding of the education/information provided today  . Self administers medications as prescribed . Attends all scheduled provider appointments . Calls provider office for new concerns or questions   Please see past updates related to this goal by clicking on the "Past Updates" button in the selected goal         Plan:   Telephone follow up appointment with care management team  member scheduled for: 07/06/19   Barb Merino, RN, BSN, CCM Care Management Coordinator [place] [facility]  Direct Phone: [phone]

## 2019-05-17 ENCOUNTER — Other Ambulatory Visit: Payer: Self-pay

## 2019-05-17 ENCOUNTER — Ambulatory Visit (INDEPENDENT_AMBULATORY_CARE_PROVIDER_SITE_OTHER): Payer: Medicare Other

## 2019-05-17 ENCOUNTER — Ambulatory Visit: Payer: Self-pay | Admitting: Pharmacist

## 2019-05-17 ENCOUNTER — Encounter: Payer: Self-pay | Admitting: Nurse Practitioner

## 2019-05-17 ENCOUNTER — Ambulatory Visit (INDEPENDENT_AMBULATORY_CARE_PROVIDER_SITE_OTHER): Payer: Medicare Other | Admitting: Nurse Practitioner

## 2019-05-17 VITALS — BP 142/80 | HR 84 | Temp 98.8°F | Ht 66.0 in | Wt 194.0 lb

## 2019-05-17 DIAGNOSIS — E119 Type 2 diabetes mellitus without complications: Secondary | ICD-10-CM

## 2019-05-17 DIAGNOSIS — Z794 Long term (current) use of insulin: Secondary | ICD-10-CM

## 2019-05-17 DIAGNOSIS — I10 Essential (primary) hypertension: Secondary | ICD-10-CM

## 2019-05-17 DIAGNOSIS — Z0000 Encounter for general adult medical examination without abnormal findings: Secondary | ICD-10-CM

## 2019-05-17 DIAGNOSIS — E559 Vitamin D deficiency, unspecified: Secondary | ICD-10-CM | POA: Diagnosis not present

## 2019-05-17 LAB — POCT URINALYSIS DIPSTICK
Bilirubin, UA: NEGATIVE
Blood, UA: NEGATIVE
Glucose, UA: NEGATIVE
Ketones, UA: NEGATIVE
Leukocytes, UA: NEGATIVE
Nitrite, UA: NEGATIVE
Protein, UA: NEGATIVE
Spec Grav, UA: 1.025 (ref 1.010–1.025)
Urobilinogen, UA: 0.2 E.U./dL
pH, UA: 6 (ref 5.0–8.0)

## 2019-05-17 LAB — POCT UA - MICROALBUMIN
Albumin/Creatinine Ratio, Urine, POC: <30
Creatinine, POC: 300 mg/dL
Microalbumin Ur, POC: 10 mg/L

## 2019-05-17 MED ORDER — PRAVASTATIN SODIUM 20 MG PO TABS: 20.0000 mg | ORAL_TABLET | Freq: Every evening | ORAL | 1 refills | Status: DC

## 2019-05-17 NOTE — Progress Notes (Signed)
This visit occurred during the SARS-CoV-2 public health emergency.  Safety protocols were in place, including screening questions prior to the visit, additional usage of staff PPE, and extensive cleaning of exam room while observing appropriate contact time as indicated for disinfecting solutions.  Subjective:   Jacqueline Bell is a 69 y.o. female who presents for Medicare Annual (Subsequent) preventive examination.  Review of Systems:  n/a Cardiac Risk Factors include: advanced age (>56mn, >>2women);diabetes mellitus;hypertension;obesity (BMI >30kg/m2)     Objective:     Vitals: BP (!) 142/80 (BP Location: Left Arm, Patient Position: Sitting, Cuff Size: Normal)   Pulse 84   Temp 98.8 F (37.1 C) (Oral)   Ht '5\' 6"'  (1.676 m)   Wt 194 lb (88 kg)   SpO2 95%   BMI 31.31 kg/m   Body mass index is 31.31 kg/m.  Advanced Directives 05/17/2019 05/05/2018 10/05/2016  Does Patient Have a Medical Advance Directive? Yes No No  Type of Advance Directive Living will - -  Would patient like information on creating a medical advance directive? - Yes (MAU/Ambulatory/Procedural Areas - Information given) -    Tobacco Social History   Tobacco Use  Smoking Status Never Smoker  Smokeless Tobacco Never Used     Counseling given: Not Answered   Clinical Intake:  Pre-visit preparation completed: Yes  Pain : No/denies pain     Nutritional Status: BMI > 30  Obese Nutritional Risks: None Diabetes: Yes  How often do you need to have someone help you when you read instructions, pamphlets, or other written materials from your doctor or pharmacy?: 1 - Never What is the last grade level you completed in school?: 2 years college  Interpreter Needed?: No  Information entered by :: NAllen LPN  Past Medical History:  Diagnosis Date  . [place] mellitus [place]    History reviewed. No pertinent surgical history. History reviewed. No pertinent family history. Social  History   Socioeconomic History  . Marital status: Married    Spouse name: Not on file  . Number of children: 4  . Years of education: Not on file  . Highest education level: Associate degree: academic program  Occupational History  . Not on file  Tobacco Use  . Smoking status: Never Smoker  . Smokeless tobacco: Never Used  Substance and Sexual Activity  . Alcohol use: No  . Drug use: No  . Sexual activity: Yes  Other Topics Concern  . Not on file  Social History Narrative   Patient reports no social determinants of health during today's screening. 3/12   Social Determinants of Health   Financial Resource Strain: Low Risk   . Difficulty of Paying Living Expenses: Not hard at all  Food Insecurity: No Food Insecurity  . Worried About RCharity fundraiserin the Last Year: Never true  . Ran Out of Food in the Last Year: Never true  Transportation Needs: No Transportation Needs  . Lack of Transportation (Medical): No  . Lack of Transportation (Non-Medical): No  Physical Activity: Insufficiently Active  . Days of Exercise per Week: 2 days  . Minutes of Exercise per Session: 20 min  Stress: No Stress Concern Present  . Feeling of Stress : Not at all  Social Connections: Not Isolated  . Frequency of Communication with Friends and Family: More than three times a week  . Frequency of Social Gatherings with Friends and Family: More than three times a week  . Attends Religious Services: More  than 4 times per year  . Active Member of Clubs or Organizations: Yes  . Attends Archivist Meetings: More than 4 times per year  . Marital Status: Married    Outpatient Encounter Medications as of 05/17/2019  Medication Sig  . cholecalciferol (VITAMIN D) 1000 units tablet Take 1,000 Units by mouth daily.  [name] EPINEPHrine 0.3 mg/0.3 mL IJ SOAJ injection   . fluticasone (FLONASE) 50 MCG/ACT nasal spray USE 1 SPRAY IN EACH NOSTRIL DAILY  . insulin degludec (TRESIBA FLEXTOUCH) 100  UNIT/ML SOPN FlexTouch Pen Inject 0.2 mLs (20 Units total) into the skin daily.  [name] loratadine (CLARITIN) 10 MG tablet Take 1 tablet (10 mg total) by mouth daily.  . meloxicam (MOBIC) 15 MG tablet TAKE 1 TABLET AS NEEDED  . Multiple Vitamin (MULTIVITAMIN) tablet Take 1 tablet by mouth daily.  . Olopatadine HCl (PAZEO) 0.7 % SOLN Place 1 drop into both eyes daily.  . Omega-3 Fatty Acids (FISH OIL PO) Take 2 capsules by mouth daily.   [name] OZEMPIC, 1 MG/DOSE, 2 MG/1.5ML SOPN INJECT 1 MG UNDER THE SKIN ONCE WEEKLY  . POTASSIUM BICARBONATE PO Take 1 tablet by mouth. Patient takes 2 tablets daily  . rosuvastatin (CRESTOR) 5 MG tablet TAKE 1 TABLET DAILY  . telmisartan (MICARDIS) 20 MG tablet Take 20 mg by mouth daily.  . Vitamin D, Ergocalciferol, (DRISDOL) 1.25 MG (50000 UT) CAPS capsule TAKE 1 CAPSULE EVERY 7 DAYS   No facility-administered encounter medications on file as of 05/17/2019.    Activities of Daily Living In your present state of health, do you have any difficulty performing the following activities: 05/17/2019  Hearing? N  Vision? N  Difficulty concentrating or making decisions? N  Walking or climbing stairs? N  Dressing or bathing? N  Doing errands, shopping? N  Preparing Food and eating ? N  Using the Toilet? N  In the past six months, have you accidently leaked urine? Y  Comment held too long  Do you have problems with loss of bowel control? N  Managing your Medications? N  Managing your Finances? N  Housekeeping or managing your Housekeeping? N  Some recent data might be hidden    Patient Care Team: [name], FNP as PCP - General ([place]) Warden Fillers, MD as Consulting Physician (Ophthalmology) Rex Kras Claudette Stapler, RN as Case Manager Lavera Guise, [place] (Pharmacist)    Assessment:   This is a routine wellness examination for [place].  Exercise Activities and Dietary recommendations Current Exercise Habits: Home exercise routine, Type of exercise:  walking, Time (Minutes): 20, Frequency (Times/Week): 2, Weekly Exercise (Minutes/Week): 40  Goals    . "I want to get my A1c down" (pt-stated)     Current Barriers:  [name] Knowledge Deficits related to Diabetes disease process and Self Health Management . Chronic Disease Management support and education needs related to DMII, HTN, Insomnia  Nurse Case Manager Clinical Goal(s):  [name] Over the next 90 days, patient will demonstrate improved adherence to prescribed treatment plan for Diabetes as evidenced by patient will report improved adherence to following a Diabetic diet; exercise and taking all Diabetic medications exactly as prescribed 08/12/18: Re-established target goal date to 90 days due to COVID-19 treatment delays Goal Met . 12/20/18 Over the next 90 days, patient will report taking her medications exactly as prescribed w/o missed doses, she will report adherence with using the plate method and portion control for meals and will report spending at least 50 minutes per week  exercising Goal Not Met, inactivity due to COVID . 05/11/19 New Over the next 90 days, patient will improve her Diabetes Self Health management AEB patient will verbalize having a routine exercise regimen, performing 150 minutes per week per ADA recommendations . 05/11/19 New Over the next 90 days, patient will maintain and or lower her A1C <8.0   CCM RN CM Interventions:  Completed call with patient on 05/11/19   Comprehensive DM assessment completed - patient continues to check her BG levels twice daily,reports FBS runs <130 on average  Determined patient does have some low's below 80 but not frequently, usually occurs when patient skips meals   Reinforced daily glycemic control with consistent FBS 80-130 and < 190 after meals; Reiterated 15'-15 rule for hypoglycemic events  Reinforced target goal for A1C of <7.0; discussed current A1C is 8.1; educated patient on importance of lowering A1C to help prevent diabetes  associated complications  Reviewed medication regimen for DM, reviewed indication, dosage and frequency, patient reports adherence with no noted SE   Assessed for adherence to following her ADA diet, pt reports adherence "most of the time"  Reinforced importance of total adherence to her prescribed DM treatment plan for best results  Reinforced importance of implementing exercise back into her daily routine as soon as possible to help control her diabetes and to achieve improved overall health - discussed ADA recommendations of 150 minutes per week (30 minutes x 5 days per week)  Printed diabetes ed mats to mail to patient for review: signs/symptoms of hypo/hyperglycemia; Life's Simple 7, 6 Tips to be Water Wise, Diabetes Zone Safety Tool   Discussed plans with patient for ongoing care management follow up and provided patient with direct contact information for care management team   Patient Self Care Activities:   Verbalizes understanding of the education/information provided today  . Self administers medications as prescribed . Attends all scheduled provider appointments . Calls provider office for new concerns or questions    Please see past updates related to this goal by clicking on the "Past Updates" button in the selected goal       . Patient Stated     05/17/2019, try to get off diabetes medication and lose 15 pounds    . Weight (lb) < 200 lb (90.7 kg) (pt-stated)     Continue to lose weight and get off of diabetic medication       Fall Risk Fall Risk  05/17/2019 01/23/2019 11/21/2018 09/19/2018 05/05/2018  Falls in the past year? 0 0 0 0 0  Risk for fall due to : Medication side effect - - - Medication side effect  Follow up Falls evaluation completed;Education provided;Falls prevention discussed - - - Education provided;Falls prevention discussed   Is the patient's home free of loose throw rugs in walkways, pet beds, electrical cords, etc?   yes      Grab bars in the  bathroom? no      Handrails on the stairs?   yes      Adequate lighting?   yes  Timed Get Up and Go performed: n/a  Depression Screen PHQ 2/9 Scores 05/17/2019 01/23/2019 11/21/2018 09/19/2018  PHQ - 2 Score 0 0 0 0  PHQ- 9 Score 3 - - -     Cognitive Function     6CIT Screen 05/17/2019 05/05/2018  What Year? 0 points 0 points  What month? 0 points 0 points  What time? 0 points 0 points  Count back from 20 0 points  0 points  Months in reverse 0 points 0 points  Repeat phrase 2 points 0 points  Total Score 2 0    Immunization History  Administered Date(s) Administered  . Influenza, High Dose Seasonal PF 11/28/2018  . Influenza-Unspecified 11/18/2017  . PFIZER SARS-COV-2 Vaccination 04/07/2019, 05/01/2019    Qualifies for Shingles Vaccine? yes  Screening Tests Health Maintenance  Topic Date Due  . PNA vac Low Risk Adult (1 of 2 - PCV13) 02/09/2017  . OPHTHALMOLOGY EXAM  06/10/2018  . HEMOGLOBIN A1C  07/23/2019  . FOOT EXAM  11/24/2019  . MAMMOGRAM  04/06/2020  . TETANUS/TDAP  10/27/2022  . COLONOSCOPY  09/18/2028  . INFLUENZA VACCINE  Completed  . DEXA SCAN  Completed  . Hepatitis C Screening  Completed    Cancer Screenings: Lung: Low Dose CT Chest recommended if Age 52-80 years, 30 pack-year currently smoking OR have quit w/in 15years. Patient does not qualify. Breast:  Up to date on Mammogram? Yes   Up to date of Bone Density/Dexa? Yes Colorectal: postponed  Additional Screenings: : Hepatitis C Screening: 03/28/2012     Plan:    Patient wants to get off diabetic medications and lose 15 pounds.   I have personally reviewed and noted the following in the patient's chart:   . Medical and social history . Use of alcohol, tobacco or illicit drugs  . Current medications and supplements . Functional ability and status . Nutritional status . Physical activity . Advanced directives . List of other physicians . Hospitalizations, surgeries, and ER visits in  previous 12 months . Vitals . Screenings to include cognitive, depression, and falls . Referrals and appointments  In addition, I have reviewed and discussed with patient certain preventive protocols, quality metrics, and best practice recommendations. A written personalized care plan for preventive services as well as general preventive health recommendations were provided to patient.     Kellie Simmering, LPN  [id number]

## 2019-05-17 NOTE — Progress Notes (Signed)
Subjective:     Patient ID: Jacqueline Bell , female    DOB: Sep 14, 1950 , 69 y.o.   MRN: [id number]   Chief Complaint  Patient presents with  . Diabetes    HPI  She is taking Tresiba (20 units) and Ozempic 1 mg (weekly).  She has used the Lantus 26 units over the last week due to running out.    Wt Readings from Last 3 Encounters: 01/23/19 : 198 lb 12.8 oz (90.2 kg) 11/21/18 : 197 lb 12.8 oz (89.7 kg) 09/19/18 : 201 lb 3.2 oz (91.3 kg)  She had the [place] covid vaccine in January and February - did well no side effects.  Diabetes She presents for her follow-up diabetic visit. She has type 2 diabetes mellitus. Her disease course has been stable. Pertinent negatives for hypoglycemia include no confusion, dizziness or headaches. There are no diabetic associated symptoms. Pertinent negatives for diabetes include no chest pain, no fatigue, no polydipsia, no polyphagia and no polyuria. There are no hypoglycemic complications. Symptoms are stable. There are no diabetic complications. Risk factors for coronary artery disease include sedentary lifestyle and obesity. Current diabetic treatment includes oral agent (dual therapy). She is compliant with treatment all of the time. Her weight is stable. She is following a generally healthy diet. When asked about meal planning, she reported none. She has not had a previous visit with a dietitian. She rarely participates in exercise. There is no change in her home blood glucose trend. (She is now using the [place].  Her blood sugars have been up to 140's. ) An ACE inhibitor/angiotensin II receptor blocker is being taken. She does not see a podiatrist.Eye exam is current.  Hypertension This is a chronic problem. The current episode started more than 1 year ago. The problem is unchanged. The problem is uncontrolled. Pertinent negatives include no anxiety, chest pain, headaches or palpitations. Risk factors for coronary artery disease include obesity, sedentary  lifestyle and diabetes mellitus. Past treatments include ACE inhibitors. The current treatment provides mild improvement. There are no compliance problems.  There is no history of angina or kidney disease. There is no history of chronic renal disease.     Past Medical History:  Diagnosis Date  . Diabetes mellitus without complication ([place])      No family history on file.   Current Outpatient Medications:  .  cholecalciferol (VITAMIN D) 1000 units tablet, Take 1,000 Units by mouth daily., Disp: , Rfl:  .  EPINEPHrine 0.3 mg/0.3 mL IJ SOAJ injection, , Disp: , Rfl:  .  fluticasone (FLONASE) 50 MCG/ACT nasal spray, USE 1 SPRAY IN EACH NOSTRIL DAILY, Disp: 16 g, Rfl: 0 .  insulin degludec (TRESIBA FLEXTOUCH) 100 UNIT/ML SOPN FlexTouch Pen, Inject 0.2 mLs (20 Units total) into the skin daily., Disp: 15 pen, Rfl: 1 .  loratadine (CLARITIN) 10 MG tablet, Take 1 tablet (10 mg total) by mouth daily., Disp: 90 tablet, Rfl: 1 .  meloxicam (MOBIC) 15 MG tablet, TAKE 1 TABLET AS NEEDED, Disp: 90 tablet, Rfl: 3 .  Multiple Vitamin (MULTIVITAMIN) tablet, Take 1 tablet by mouth daily., Disp: , Rfl:  .  Olopatadine HCl (PAZEO) 0.7 % SOLN, Place 1 drop into both eyes daily., Disp: 1 Bottle, Rfl: 5 .  Omega-3 Fatty Acids (FISH OIL PO), Take 2 capsules by mouth daily. , Disp: , Rfl:  .  OZEMPIC, 1 MG/DOSE, 2 MG/1.5ML SOPN, INJECT 1 MG UNDER THE SKIN ONCE WEEKLY, Disp: 9 mL, Rfl: 3 .  POTASSIUM  BICARBONATE PO, Take 1 tablet by mouth. Patient takes 2 tablets daily, Disp: , Rfl:  .  rosuvastatin (CRESTOR) 5 MG tablet, TAKE 1 TABLET DAILY, Disp: 90 tablet, Rfl: 3 .  telmisartan (MICARDIS) 20 MG tablet, Take 20 mg by mouth daily., Disp: , Rfl:  .  Vitamin D, Ergocalciferol, (DRISDOL) 1.25 MG (50000 UT) CAPS capsule, TAKE 1 CAPSULE EVERY 7 DAYS, Disp: 12 capsule, Rfl: 3   Allergies  Allergen Reactions  . Iodine Hives  . Shellfish Allergy Hives     Review of Systems  Constitutional: Negative.  Negative for  fatigue.  Respiratory: Negative.  Negative for cough.   Cardiovascular: Negative.  Negative for chest pain, palpitations and leg swelling.  Endocrine: Negative for polydipsia, polyphagia and polyuria.  Genitourinary: Negative.   Musculoskeletal: Negative.   Neurological: Negative for dizziness and headaches.  Psychiatric/Behavioral: Negative for confusion.     Today's Vitals   05/17/19 1125  BP: (!) 142/80  Pulse: 84  Temp: 98.8 F (37.1 C)  TempSrc: Oral  Weight: 194 lb (88 kg)  Height: _0  (1.676 m)  PainSc: 0-No pain   Body mass index is 31.31 kg/m.   Objective:  Physical Exam Vitals reviewed.  Constitutional:      General: She is not in acute distress.    Appearance: Normal appearance. She is obese.  Cardiovascular:     Rate and Rhythm: Normal rate and regular rhythm.     Pulses: Normal pulses.     Heart sounds: Normal heart sounds. No murmur.  Pulmonary:     Effort: Pulmonary effort is normal. No respiratory distress.     Breath sounds: Normal breath sounds.  Musculoskeletal:        General: Normal range of motion.  Skin:    General: Skin is warm and dry.     Capillary Refill: Capillary refill takes less than 2 seconds.  Neurological:     General: No focal deficit present.     Mental Status: She is alert and oriented to person, place, and time.  Psychiatric:        Mood and Affect: Mood normal.        Behavior: Behavior normal.        Thought Content: Thought content normal.        Judgment: Judgment normal.         Assessment And Plan:     1. Type 2 diabetes mellitus without complication, with long-term current use of insulin (HCC)  Chronic, stable  Continue with current medications - Ambulatory referral to Podiatry - CMP14+EGFR - Lipid panel - pravastatin (PRAVACHOL) 20 MG tablet; Take 1 tablet (20 mg total) by mouth every evening.  Dispense: 90 tablet; Refill: 1 - Hemoglobin A1c  2. Essential hypertension  Chronic, fair control  She is  encouraged to continue with avoiding high salt meals/foods  Continue with good water intake  3. Vitamin D deficiency Will check vitamin D level and supplement as needed.    Also encouraged to spend 15 minutes in the sun daily.  - VITAMIN D 25 Hydroxy (Vit-D Deficiency, Fractures)        [name], FNP

## 2019-05-17 NOTE — Addendum Note (Signed)
Addended by: Elisha Ponder E on: 05/17/2019 12:47 PM   Modules accepted: Orders

## 2019-05-17 NOTE — Patient Instructions (Signed)
Get over the counter CoQ10 daily.

## 2019-05-17 NOTE — Patient Instructions (Signed)
Jacqueline Bell , Thank you for taking time to come for your Medicare Wellness Visit. I appreciate your ongoing commitment to your health goals. Please review the following plan we discussed and let me know if I can assist you in the future.   Screening recommendations/referrals: Colonoscopy: postponed per patient  Mammogram: scheduled Bone Density: 10/2012 Recommended yearly ophthalmology/optometry visit for glaucoma screening and checkup Recommended yearly dental visit for hygiene and checkup  Vaccinations: Influenza vaccine: 11/2018 Pneumococcal vaccine: postponed after covid vaccine Tdap vaccine: 10/2012 Shingles vaccine: discussed    Advanced directives: Please bring a copy of your POA (Power of [place]) and/or Living Will to your next appointment.    Conditions/risks identified: obesity  Next appointment:    Preventive Care 65 Years and Older, Female Preventive care refers to lifestyle choices and visits with your health care provider that can promote health and wellness. What does preventive care include?  A yearly physical exam. This is also called an annual well check.  Dental exams once or twice a year.  Routine eye exams. Ask your health care provider how often you should have your eyes checked.  Personal lifestyle choices, including:  Daily care of your teeth and gums.  Regular physical activity.  Eating a healthy diet.  Avoiding tobacco and drug use.  Limiting alcohol use.  Practicing safe sex.  Taking low-dose aspirin every day.  Taking vitamin and mineral supplements as recommended by your health care provider. What happens during an annual well check? The services and screenings done by your health care provider during your annual well check will depend on your age, overall health, lifestyle risk factors, and family history of disease. Counseling  Your health care provider may ask you questions about your:  Alcohol use.  Tobacco use.  Drug  use.  Emotional well-being.  Home and relationship well-being.  Sexual activity.  Eating habits.  History of falls.  Memory and ability to understand (cognition).  Work and work Astronomer.  Reproductive health. Screening  You may have the following tests or measurements:  Height, weight, and BMI.  Blood pressure.  Lipid and cholesterol levels. These may be checked every 5 years, or more frequently if you are over 46 years old.  Skin check.  Lung cancer screening. You may have this screening every year starting at age 16 if you have a 30-pack-year history of smoking and currently smoke or have quit within the past 15 years.  Fecal occult blood test (FOBT) of the stool. You may have this test every year starting at age 36.  Flexible sigmoidoscopy or colonoscopy. You may have a sigmoidoscopy every 5 years or a colonoscopy every 10 years starting at age 38.  Hepatitis C blood test.  Hepatitis B blood test.  Sexually transmitted disease (STD) testing.  Diabetes screening. This is done by checking your blood sugar (glucose) after you have not eaten for a while (fasting). You may have this done every 1-3 years.  Bone density scan. This is done to screen for osteoporosis. You may have this done starting at age 52.  Mammogram. This may be done every 1-2 years. Talk to your health care provider about how often you should have regular mammograms. Talk with your health care provider about your test results, treatment options, and if necessary, the need for more tests. Vaccines  Your health care provider may recommend certain vaccines, such as:  Influenza vaccine. This is recommended every year.  Tetanus, diphtheria, and acellular pertussis (Tdap, Td) vaccine. You may  need a Td booster every 10 years.  Zoster vaccine. You may need this after age 86.  Pneumococcal 13-valent conjugate (PCV13) vaccine. One dose is recommended after age 5.  Pneumococcal polysaccharide  (PPSV23) vaccine. One dose is recommended after age 32. Talk to your health care provider about which screenings and vaccines you need and how often you need them. This information is not intended to replace advice given to you by your health care provider. Make sure you discuss any questions you have with your health care provider. Document Released: 03/22/2015 Document Revised: 11/13/2015 Document Reviewed: 12/25/2014 Elsevier Interactive Patient Education  2017 [place] Prevention in the Home Falls can cause injuries. They can happen to people of all ages. There are many things you can do to make your home safe and to help prevent falls. What can I do on the outside of my home?  Regularly fix the edges of walkways and driveways and fix any cracks.  Remove anything that might make you trip as you walk through a door, such as a raised step or threshold.  Trim any bushes or trees on the path to your home.  Use bright outdoor lighting.  Clear any walking paths of anything that might make someone trip, such as rocks or tools.  Regularly check to see if handrails are loose or broken. Make sure that both sides of any steps have handrails.  Any raised decks and porches should have guardrails on the edges.  Have any leaves, snow, or ice cleared regularly.  Use sand or salt on walking paths during winter.  Clean up any spills in your garage right away. This includes oil or grease spills. What can I do in the bathroom?  Use night lights.  Install grab bars by the toilet and in the tub and shower. Do not use towel bars as grab bars.  Use non-skid mats or decals in the tub or shower.  If you need to sit down in the shower, use a plastic, non-slip stool.  Keep the floor dry. Clean up any water that spills on the floor as soon as it happens.  Remove soap buildup in the tub or shower regularly.  Attach bath mats securely with double-sided non-slip rug tape.  Do not have  throw rugs and other things on the floor that can make you trip. What can I do in the bedroom?  Use night lights.  Make sure that you have a light by your bed that is easy to reach.  Do not use any sheets or blankets that are too big for your bed. They should not hang down onto the floor.  Have a firm chair that has side arms. You can use this for support while you get dressed.  Do not have throw rugs and other things on the floor that can make you trip. What can I do in the kitchen?  Clean up any spills right away.  Avoid walking on wet floors.  Keep items that you use a lot in easy-to-reach places.  If you need to reach something above you, use a strong step stool that has a grab bar.  Keep electrical cords out of the way.  Do not use floor polish or wax that makes floors slippery. If you must use wax, use non-skid floor wax.  Do not have throw rugs and other things on the floor that can make you trip. What can I do with my stairs?  Do not leave any items on  the stairs.  Make sure that there are handrails on both sides of the stairs and use them. Fix handrails that are broken or loose. Make sure that handrails are as long as the stairways.  Check any carpeting to make sure that it is firmly attached to the stairs. Fix any carpet that is loose or worn.  Avoid having throw rugs at the top or bottom of the stairs. If you do have throw rugs, attach them to the floor with carpet tape.  Make sure that you have a light switch at the top of the stairs and the bottom of the stairs. If you do not have them, ask someone to add them for you. What else can I do to help prevent falls?  Wear shoes that:  Do not have high heels.  Have rubber bottoms.  Are comfortable and fit you well.  Are closed at the toe. Do not wear sandals.  If you use a stepladder:  Make sure that it is fully opened. Do not climb a closed stepladder.  Make sure that both sides of the stepladder are  locked into place.  Ask someone to hold it for you, if possible.  Clearly mark and make sure that you can see:  Any grab bars or handrails.  First and last steps.  Where the edge of each step is.  Use tools that help you move around (mobility aids) if they are needed. These include:  Canes.  Walkers.  Scooters.  Crutches.  Turn on the lights when you go into a dark area. Replace any light bulbs as soon as they burn out.  Set up your furniture so you have a clear path. Avoid moving your furniture around.  If any of your floors are uneven, fix them.  If there are any pets around you, be aware of where they are.  Review your medicines with your doctor. Some medicines can make you feel dizzy. This can increase your chance of falling. Ask your doctor what other things that you can do to help prevent falls. This information is not intended to replace advice given to you by your health care provider. Make sure you discuss any questions you have with your health care provider. Document Released: 12/20/2008 Document Revised: 08/01/2015 Document Reviewed: 03/30/2014 Elsevier Interactive Patient Education  2017 [employer].

## 2019-05-18 LAB — CMP14+EGFR
ALT: 17 IU/L (ref 0–32)
AST: 23 IU/L (ref 0–40)
Albumin/Globulin Ratio: 1.4 (ref 1.2–2.2)
Albumin: 4.4 g/dL (ref 3.8–4.8)
Alkaline Phosphatase: 53 IU/L (ref 39–117)
BUN/Creatinine Ratio: 11 — ABNORMAL LOW (ref 12–28)
BUN: 8 mg/dL (ref 8–27)
Bilirubin Total: 0.4 mg/dL (ref 0.0–1.2)
CO2: 27 mmol/L (ref 20–29)
Calcium: 9.6 mg/dL (ref 8.7–10.3)
Chloride: 99 mmol/L (ref 96–106)
Creatinine, Ser: 0.75 mg/dL (ref 0.57–1.00)
GFR calc Af Amer: 95 mL/min/{1.73_m2} (ref >59)
GFR calc non Af Amer: 83 mL/min/{1.73_m2} (ref >59)
Globulin, Total: 3.2 g/dL (ref 1.5–4.5)
Glucose: 132 mg/dL — ABNORMAL HIGH (ref 65–99)
Potassium: 4.4 mmol/L (ref 3.5–5.2)
Sodium: 140 mmol/L (ref 134–144)
Total Protein: 7.6 g/dL (ref 6.0–8.5)

## 2019-05-18 LAB — HEMOGLOBIN A1C
Est. average glucose Bld gHb Est-mCnc: 171 mg/dL
Hgb A1c MFr Bld: 7.6 % — ABNORMAL HIGH (ref 4.8–5.6)

## 2019-05-18 LAB — LIPID PANEL
Chol/HDL Ratio: 3.2 ratio (ref 0.0–4.4)
Cholesterol, Total: 173 mg/dL (ref 100–199)
HDL: 54 mg/dL (ref >39)
LDL Chol Calc (NIH): 101 mg/dL — ABNORMAL HIGH (ref 0–99)
Triglycerides: 100 mg/dL (ref 0–149)
VLDL Cholesterol Cal: 18 mg/dL (ref 5–40)

## 2019-05-18 LAB — VITAMIN D 25 HYDROXY (VIT D DEFICIENCY, FRACTURES): Vit D, 25-Hydroxy: 86.3 ng/mL (ref 30.0–100.0)

## 2019-05-19 NOTE — Patient Instructions (Signed)
Visit Information  Goals Addressed            This Visit's Progress     Patient Stated   . "I want to get my A1c down" (pt-stated)       Current Barriers:  [name] Knowledge Deficits related to Diabetes disease process and Self Health Management . Chronic Disease Management support and education needs related to DMII, HTN, Insomnia  Nurse Case Manager & PHARMD Clinical Goal(s):  [name] Over the next 90 days, patient will demonstrate improved adherence to prescribed treatment plan for Diabetes as evidenced by patient will report improved adherence to following a Diabetic diet; exercise and taking all Diabetic medications exactly as prescribed 08/12/18: Re-established target goal date to 90 days due to COVID-19 treatment delays Goal Met . 12/20/18 Over the next 90 days, patient will report taking her medications exactly as prescribed w/o missed doses, she will report adherence with using the plate method and portion control for meals and will report spending at least 50 minutes per week exercising Goal Not Met, inactivity due to COVID . 05/11/19 New Over the next 90 days, patient will improve her Diabetes Self Health management AEB patient will verbalize having a routine exercise regimen, performing 150 minutes per week per ADA recommendations . 05/11/19 New Over the next 90 days, patient will maintain and or lower her A1C <8.0   CCM RN CM Interventions:  Completed call with patient on 05/11/19   Comprehensive DM assessment completed - patient continues to check her BG levels twice daily,reports FBS runs <130 on average  Determined patient does have some low's below 80 but not frequently, usually occurs when patient skips meals   Reinforced daily glycemic control with consistent FBS 80-130 and < 190 after meals; Reiterated 15'-15 rule for hypoglycemic events  Reinforced target goal for A1C of <7.0; discussed current A1C is 8.1; educated patient on importance of lowering A1C to help prevent diabetes  associated complications  Reviewed medication regimen for DM, reviewed indication, dosage and frequency, patient reports adherence with no noted SE   Assessed for adherence to following her ADA diet, pt reports adherence "most of the time"  Reinforced importance of total adherence to her prescribed DM treatment plan for best results  Reinforced importance of implementing exercise back into her daily routine as soon as possible to help control her diabetes and to achieve improved overall health - discussed ADA recommendations of 150 minutes per week (30 minutes x 5 days per week)  Printed diabetes ed mats to mail to patient for review: signs/symptoms of hypo/hyperglycemia; Life's Simple 7, 6 Tips to be Water Wise, Diabetes Zone Safety Tool   Discussed plans with patient for ongoing care management follow up and provided patient with direct contact information for care management team  CCM PharmD Interventions:  Completed clinic visit with patient on 05/17/19 . A1c today has decreased to 7.8%--patient encouraged to continue her progress o Continues on Ozempic 39m weekly, Tresiba 20 units qHS . Her freestyle libre CGM is functioning properly . [facility] . She denies needing refills on medications at this time . Encouraged diabetic friendly diet/increase exercise and water intake . 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  LDL 101 today. . BP goal <130/80--compliant with telmisartan, no changes  Patient Self Care Activities:   Verbalizes understanding of the education/information provided today  . Self administers medications as prescribed . Attends all scheduled provider appointments .  Calls provider office for new concerns or questions    Please see past updates related to this goal by clicking on the "Past Updates" button in the selected goal          The patient verbalized understanding  of instructions provided today and declined a print copy of patient instruction materials.   The care management team will reach out to the patient again over the next 60 days.   SIGNATURE Regina Eck, PharmD, BCPS Clinical Pharmacist, [facility] [place]: [phone]

## 2019-05-19 NOTE — Progress Notes (Signed)
Chronic Care Management    Visit Note  05/17/2019 Name: Jacqueline Bell MRN: [id number] DOB: 29-May-1950  Referred by: [name], FNP Reason for referral : Chronic Care Management and Diabetes   Jacqueline Bell is a 69 y.o. year old female who is a primary care patient of [name], [place]. The CCM team was consulted for assistance with chronic disease management and care coordination needs related to HTN, HLD and DMII  Review of patient status, including review of consultants reports, relevant laboratory and other test results, and collaboration with appropriate care team members and the patient's provider was performed as part of comprehensive patient evaluation and provision of chronic care management services.    SDOH (Social Determinants of Health) assessments performed: No See Care Plan activities for detailed interventions related to SDOH)     Medications: Outpatient Encounter Medications as of 05/17/2019  Medication Sig Note  . cholecalciferol (VITAMIN D) 1000 units tablet Take 1,000 Units by mouth daily.   [name] EPINEPHrine 0.3 mg/0.3 mL IJ SOAJ injection    . fluticasone (FLONASE) 50 MCG/ACT nasal spray USE 1 SPRAY IN EACH NOSTRIL DAILY   . insulin degludec (TRESIBA FLEXTOUCH) 100 UNIT/ML SOPN FlexTouch Pen Inject 0.2 mLs (20 Units total) into the skin daily.   [name] loratadine (CLARITIN) 10 MG tablet Take 1 tablet (10 mg total) by mouth daily.   . meloxicam (MOBIC) 15 MG tablet TAKE 1 TABLET AS NEEDED   . Multiple Vitamin (MULTIVITAMIN) tablet Take 1 tablet by mouth daily.   . Olopatadine HCl (PAZEO) 0.7 % SOLN Place 1 drop into both eyes daily. 05/19/2018: Patient is taking as needed.   . Omega-3 Fatty Acids (FISH OIL PO) Take 2 capsules by mouth daily.    [name] OZEMPIC, 1 MG/DOSE, 2 MG/1.5ML SOPN INJECT 1 MG UNDER THE SKIN ONCE WEEKLY   . POTASSIUM BICARBONATE PO Take 1 tablet by mouth. Patient takes 2 tablets daily   . pravastatin (PRAVACHOL) 20 MG tablet Take 1 tablet (20 mg  total) by mouth every evening.   [name] telmisartan (MICARDIS) 20 MG tablet Take 20 mg by mouth daily.   . Vitamin D, Ergocalciferol, (DRISDOL) 1.25 MG (50000 UT) CAPS capsule TAKE 1 CAPSULE EVERY 7 DAYS   . [DISCONTINUED] rosuvastatin (CRESTOR) 5 MG tablet TAKE 1 TABLET DAILY    No facility-administered encounter medications on file as of 05/17/2019.     Objective:   Goals Addressed            This Visit's Progress     Patient Stated   . "I want to get my A1c down" (pt-stated)       Current Barriers:  [name] Knowledge Deficits related to Diabetes disease process and Self Health Management . Chronic Disease Management support and education needs related to DMII, HTN, Insomnia  Nurse Case Manager & PHARMD Clinical Goal(s):  [name] Over the next 90 days, patient will demonstrate improved adherence to prescribed treatment plan for Diabetes as evidenced by patient will report improved adherence to following a Diabetic diet; exercise and taking all Diabetic medications exactly as prescribed 08/12/18: Re-established target goal date to 90 days due to COVID-19 treatment delays Goal Met . 12/20/18 Over the next 90 days, patient will report taking her medications exactly as prescribed w/o missed doses, she will report adherence with using the plate method and portion control for meals and will report spending at least 50 minutes per week exercising Goal Not Met, inactivity due to COVID . 05/11/19 New Over  the next 90 days, patient will improve her Diabetes Self Health management AEB patient will verbalize having a routine exercise regimen, performing 150 minutes per week per ADA recommendations . 05/11/19 New Over the next 90 days, patient will maintain and or lower her A1C <8.0   CCM RN CM Interventions:  Completed call with patient on 05/11/19   Comprehensive DM assessment completed - patient continues to check her BG levels twice daily,reports FBS runs <130 on average  Determined patient does have some  low's below 80 but not frequently, usually occurs when patient skips meals   Reinforced daily glycemic control with consistent FBS 80-130 and < 190 after meals; Reiterated 15'-15 rule for hypoglycemic events  Reinforced target goal for A1C of <7.0; discussed current A1C is 8.1; educated patient on importance of lowering A1C to help prevent diabetes associated complications  Reviewed medication regimen for DM, reviewed indication, dosage and frequency, patient reports adherence with no noted SE   Assessed for adherence to following her ADA diet, pt reports adherence "most of the time"  Reinforced importance of total adherence to her prescribed DM treatment plan for best results  Reinforced importance of implementing exercise back into her daily routine as soon as possible to help control her diabetes and to achieve improved overall health - discussed ADA recommendations of 150 minutes per week (30 minutes x 5 days per week)  Printed diabetes ed mats to mail to patient for review: signs/symptoms of hypo/hyperglycemia; Life's Simple 7, 6 Tips to be Water Wise, Diabetes Zone Safety Tool   Discussed plans with patient for ongoing care management follow up and provided patient with direct contact information for care management team  CCM PharmD Interventions:  Completed clinic visit with patient on 05/17/19 . A1c today has decreased to 7.8%--patient encouraged to continue her progress o Continues on Ozempic 62m weekly, Tresiba 20 units qHS . Her freestyle libre CGM is functioning properly . [facility] . She denies needing refills on medications at this time . Encouraged diabetic friendly diet/increase exercise and water intake . 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  LDL 101 today.  Updated med list to reflect changes . BP goal <130/80--compliant with telmisartan, no changes  Patient Self Care  Activities:   Verbalizes understanding of the education/information provided today  . Self administers medications as prescribed . Attends all scheduled provider appointments . Calls provider office for new concerns or questions    Please see past updates related to this goal by clicking on the "Past Updates" button in the selected goal           Plan:   The care management team will reach out to the patient again over the next 60 days.   Provider Signature   JuRegina EckPharmD, BCPS Clinical Pharmacist, [phone]

## 2019-06-19 DIAGNOSIS — Z1231 Encounter for screening mammogram for malignant neoplasm of breast: Secondary | ICD-10-CM | POA: Diagnosis not present

## 2019-06-19 LAB — HM MAMMOGRAPHY

## 2019-06-20 ENCOUNTER — Encounter: Payer: Self-pay | Admitting: Nurse Practitioner

## 2019-07-06 ENCOUNTER — Telehealth: Payer: Self-pay

## 2019-07-25 ENCOUNTER — Telehealth: Payer: Self-pay | Admitting: Nurse Practitioner

## 2019-07-25 NOTE — Chronic Care Management (AMB) (Signed)
  Chronic Care Management   Note  07/25/2019 Name: DELITHA ELMS MRN: [id number] DOB: Apr 27, 1950  VENDELA TROUNG is a 69 y.o. year old female who is a primary care patient of [name], [place]. I reached out to [name] by phone today in response to a referral sent by Ms. [employer]'s health plan.     Ms. Bhavsar was given information about Chronic Care Management services today including:  1. CCM service includes personalized support from designated clinical staff supervised by her physician, including individualized plan of care and coordination with other care providers 2. 24/7 contact phone numbers for assistance for urgent and routine care needs. 3. Service will only be billed when office clinical staff spend 20 minutes or more in a month to coordinate care. 4. Only one practitioner may furnish and bill the service in a calendar month. 5. The patient may stop CCM services at any time (effective at the end of the month) by phone call to the office staff. 6. The patient will be responsible for cost sharing (co-pay) of up to 20% of the service fee (after annual deductible is met).  Patient agreed to services and verbal consent obtained.   Follow up plan: Telephone appointment with care management team member scheduled for:08/30/2019.  [place], [place] [address] Direct Dial: [phone] [email or web address] Website: [email or web address]

## 2019-08-09 ENCOUNTER — Other Ambulatory Visit: Payer: Self-pay

## 2019-08-09 DIAGNOSIS — E119 Type 2 diabetes mellitus without complications: Secondary | ICD-10-CM

## 2019-08-09 MED ORDER — MELOXICAM 15 MG PO TABS: 15.0000 mg | ORAL_TABLET | ORAL | 0 refills | Status: DC | PRN

## 2019-08-09 MED ORDER — TELMISARTAN 20 MG PO TABS: 20.0000 mg | ORAL_TABLET | Freq: Every day | ORAL | 0 refills | Status: DC

## 2019-08-09 MED ORDER — TELMISARTAN 20 MG PO TABS: 20.0000 mg | ORAL_TABLET | Freq: Every day | ORAL | 1 refills | Status: DC

## 2019-08-17 ENCOUNTER — Encounter: Payer: Self-pay | Admitting: Nurse Practitioner

## 2019-08-17 ENCOUNTER — Other Ambulatory Visit: Payer: Self-pay

## 2019-08-17 ENCOUNTER — Ambulatory Visit (INDEPENDENT_AMBULATORY_CARE_PROVIDER_SITE_OTHER): Payer: Medicare Other | Admitting: Nurse Practitioner

## 2019-08-17 VITALS — BP 138/78 | HR 77 | Temp 98.7°F | Ht 66.0 in | Wt 194.0 lb

## 2019-08-17 DIAGNOSIS — E559 Vitamin D deficiency, unspecified: Secondary | ICD-10-CM

## 2019-08-17 DIAGNOSIS — I10 Essential (primary) hypertension: Secondary | ICD-10-CM | POA: Diagnosis not present

## 2019-08-17 DIAGNOSIS — Z794 Long term (current) use of insulin: Secondary | ICD-10-CM

## 2019-08-17 DIAGNOSIS — E119 Type 2 diabetes mellitus without complications: Secondary | ICD-10-CM

## 2019-08-17 DIAGNOSIS — Z23 Encounter for immunization: Secondary | ICD-10-CM

## 2019-08-17 MED ORDER — PNEUMOCOCCAL 13-VAL CONJ VACC IM SUSP: 0.5000 mL | INTRAMUSCULAR | 0 refills | Status: AC

## 2019-08-17 MED ORDER — TELMISARTAN 20 MG PO TABS: 20.0000 mg | ORAL_TABLET | Freq: Every day | ORAL | 1 refills | Status: DC

## 2019-08-17 MED ORDER — MELOXICAM 15 MG PO TABS: 15.0000 mg | ORAL_TABLET | ORAL | 1 refills | Status: DC | PRN

## 2019-08-17 MED ORDER — NOVOLOG FLEXPEN 100 UNIT/ML ~~LOC~~ SOPN: PEN_INJECTOR | SUBCUTANEOUS | 11 refills | Status: DC

## 2019-08-17 NOTE — Progress Notes (Signed)
Subjective:     Patient ID: Jacqueline Bell , female    DOB: Dec 18, 1950 , 69 y.o.   MRN: [id number]   Chief Complaint  Patient presents with  . Diabetes    patient presents today for a 3 month f/u on her diabetes    HPI  Here today for her 3 month diabetes follow up.    Average last 90 days was 147.  She is taking Lantus (26 units) since her last visit when she says she ran out and Ozempic 1 mg (weekly). She is also taking Novolog sliding scale with meals on average using 2-3 times a week   Wt Readings from Last 3 Encounters: 08/17/19 : 194 lb (88 kg) 05/17/19 : 194 lb (88 kg) 05/17/19 : 194 lb (88 kg)    Diabetes She presents for her follow-up diabetic visit. She has type 2 diabetes mellitus. Her disease course has been stable. Pertinent negatives for hypoglycemia include no confusion, dizziness or headaches. There are no diabetic associated symptoms. Pertinent negatives for diabetes include no chest pain, no fatigue, no polydipsia, no polyphagia and no polyuria. There are no hypoglycemic complications. Symptoms are stable. There are no diabetic complications. Risk factors for coronary artery disease include sedentary lifestyle and obesity. Current diabetic treatment includes oral agent (dual therapy). She is compliant with treatment all of the time. Her weight is stable. She is following a generally healthy diet. When asked about meal planning, she reported none. She has not had a previous visit with a dietitian. She rarely participates in exercise. There is no change in her home blood glucose trend. (She is now using the [place].  Her blood sugars have been up to 140's. ) An ACE inhibitor/angiotensin II receptor blocker is being taken. She does not see a podiatrist.Eye exam is not current.  Hypertension This is a chronic problem. The current episode started more than 1 year ago. The problem is unchanged. The problem is uncontrolled. Pertinent negatives include no anxiety, chest pain,  headaches or palpitations. Risk factors for coronary artery disease include obesity, sedentary lifestyle and diabetes mellitus. Past treatments include ACE inhibitors. The current treatment provides mild improvement. There are no compliance problems.  There is no history of angina or kidney disease. There is no history of chronic renal disease.     Past Medical History:  Diagnosis Date  . Diabetes mellitus without complication ([place])      History reviewed. No pertinent family history.   Current Outpatient Medications:  .  cholecalciferol (VITAMIN D) 1000 units tablet, Take 1,000 Units by mouth daily., Disp: , Rfl:  .  EPINEPHrine 0.3 mg/0.3 mL IJ SOAJ injection, , Disp: , Rfl:  .  fluticasone (FLONASE) 50 MCG/ACT nasal spray, USE 1 SPRAY IN EACH NOSTRIL DAILY, Disp: 16 g, Rfl: 0 .  insulin degludec (TRESIBA FLEXTOUCH) 100 UNIT/ML SOPN FlexTouch Pen, Inject 0.2 mLs (20 Units total) into the skin daily., Disp: 15 pen, Rfl: 1 .  loratadine (CLARITIN) 10 MG tablet, Take 1 tablet (10 mg total) by mouth daily., Disp: 90 tablet, Rfl: 1 .  meloxicam (MOBIC) 15 MG tablet, Take 1 tablet (15 mg total) by mouth as needed., Disp: 90 tablet, Rfl: 1 .  Multiple Vitamin (MULTIVITAMIN) tablet, Take 1 tablet by mouth daily., Disp: , Rfl:  .  Olopatadine HCl (PAZEO) 0.7 % SOLN, Place 1 drop into both eyes daily., Disp: 1 Bottle, Rfl: 5 .  Omega-3 Fatty Acids (FISH OIL PO), Take 2 capsules by mouth daily. ,  Disp: , Rfl:  .  OZEMPIC, 1 MG/DOSE, 2 MG/1.5ML SOPN, INJECT 1 MG UNDER THE SKIN ONCE WEEKLY, Disp: 9 mL, Rfl: 3 .  POTASSIUM BICARBONATE PO, Take 1 tablet by mouth. Patient takes 2 tablets daily, Disp: , Rfl:  .  pravastatin (PRAVACHOL) 20 MG tablet, Take 1 tablet (20 mg total) by mouth every evening., Disp: 90 tablet, Rfl: 1 .  telmisartan (MICARDIS) 20 MG tablet, Take 1 tablet (20 mg total) by mouth daily., Disp: 90 tablet, Rfl: 1 .  Vitamin D, Ergocalciferol, (DRISDOL) 1.25 MG (50000 UT) CAPS capsule,  TAKE 1 CAPSULE EVERY 7 DAYS, Disp: 12 capsule, Rfl: 3 .  insulin aspart (NOVOLOG FLEXPEN) 100 UNIT/ML FlexPen, Per SS: < 70 = 0; 70-90 = 4 units; 91-130 = 6 units; 131-150 = 7 units; 151-200 = 8 units; 201-250 = 9 units; 251-300 = 10 units; 301-350 = 11 units; 351-400 = 12 units; 401-450 = 13 units; > 450 = 14 units., Disp: 15 mL, Rfl: 11 .  pneumococcal 13-valent conjugate vaccine (PREVNAR 13) SUSP injection, Inject 0.5 mLs into the muscle tomorrow at 10 am for 1 dose., Disp: 0.5 mL, Rfl: 0   Allergies  Allergen Reactions  . Iodine Hives  . Shellfish Allergy Hives     Review of Systems  Constitutional: Negative.  Negative for fatigue.  Respiratory: Negative.  Negative for cough.   Cardiovascular: Negative.  Negative for chest pain, palpitations and leg swelling.  Endocrine: Negative for polydipsia, polyphagia and polyuria.  Genitourinary: Negative.   Musculoskeletal: Negative.   Neurological: Negative for dizziness and headaches.  Psychiatric/Behavioral: Negative for confusion.     Today's Vitals   08/17/19 1032  BP: 138/78  Pulse: 77  Temp: 98.7 F (37.1 C)  TempSrc: Oral  Weight: 194 lb (88 kg)  Height: 5' 6" (1.676 m)  PainSc: 0-No pain   Body mass index is 31.31 kg/m.   Objective:  Physical Exam Vitals reviewed.  Constitutional:      General: She is not in acute distress.    Appearance: Normal appearance. She is obese.  Cardiovascular:     Rate and Rhythm: Normal rate and regular rhythm.     Pulses: Normal pulses.     Heart sounds: Normal heart sounds. No murmur heard.   Pulmonary:     Effort: Pulmonary effort is normal. No respiratory distress.     Breath sounds: Normal breath sounds.  Musculoskeletal:        General: Normal range of motion.  Skin:    General: Skin is warm and dry.     Capillary Refill: Capillary refill takes less than 2 seconds.  Neurological:     General: No focal deficit present.     Mental Status: She is alert and oriented to person,  place, and time.  Psychiatric:        Mood and Affect: Mood normal.        Behavior: Behavior normal.        Thought Content: Thought content normal.        Judgment: Judgment normal.         Assessment And Plan:     1. Type 2 diabetes mellitus without complication, with long-term current use of insulin (HCC)  Chronic, stable  Continue with current medications until we receive the lab results. She has also been taking Novolog as needed with meals   I have advised her again to schedule her appt for the eye doctor - insulin aspart (NOVOLOG FLEXPEN) 100  UNIT/ML FlexPen; Per SS: < 70 = 0; 70-90 = 4 units; 91-130 = 6 units; 131-150 = 7 units; 151-200 = 8 units; 201-250 = 9 units; 251-300 = 10 units; 301-350 = 11 units; 351-400 = 12 units; 401-450 = 13 units; > 450 = 14 units.  Dispense: 15 mL; Refill: 11 - Hemoglobin A1c - telmisartan (MICARDIS) 20 MG tablet; Take 1 tablet (20 mg total) by mouth daily.  Dispense: 90 tablet; Refill: 1  2. Essential hypertension  Chronic, fair control  Continue current medications - Rx sent to mail order - Hemoglobin A1c - CMP14+EGFR - telmisartan (MICARDIS) 20 MG tablet; Take 1 tablet (20 mg total) by mouth daily.  Dispense: 90 tablet; Refill: 1  3. Vitamin D deficiency Continue with vitamin d supplement    Also encouraged to spend 15 minutes in the sun daily.   4. Encounter for immunization  Sent Rx to local pharmacy - pneumococcal 13-valent conjugate vaccine (PREVNAR 13) SUSP injection; Inject 0.5 mLs into the muscle tomorrow at 10 am for 1 dose.  Dispense: 0.5 mL; Refill: 0     [name], FNP

## 2019-08-18 ENCOUNTER — Other Ambulatory Visit: Payer: Self-pay | Admitting: Nurse Practitioner

## 2019-08-18 DIAGNOSIS — E119 Type 2 diabetes mellitus without complications: Secondary | ICD-10-CM

## 2019-08-18 LAB — CMP14+EGFR
ALT: 18 IU/L (ref 0–32)
AST: 23 IU/L (ref 0–40)
Albumin/Globulin Ratio: 1.4 (ref 1.2–2.2)
Albumin: 4.4 g/dL (ref 3.8–4.8)
Alkaline Phosphatase: 49 IU/L (ref 48–121)
BUN/Creatinine Ratio: 11 — ABNORMAL LOW (ref 12–28)
BUN: 9 mg/dL (ref 8–27)
Bilirubin Total: 0.4 mg/dL (ref 0.0–1.2)
CO2: 24 mmol/L (ref 20–29)
Calcium: 9.2 mg/dL (ref 8.7–10.3)
Chloride: 100 mmol/L (ref 96–106)
Creatinine, Ser: 0.79 mg/dL (ref 0.57–1.00)
GFR calc Af Amer: 90 mL/min/{1.73_m2} (ref >59)
GFR calc non Af Amer: 78 mL/min/{1.73_m2} (ref >59)
Globulin, Total: 3.2 g/dL (ref 1.5–4.5)
Glucose: 108 mg/dL — ABNORMAL HIGH (ref 65–99)
Potassium: 4.1 mmol/L (ref 3.5–5.2)
Sodium: 140 mmol/L (ref 134–144)
Total Protein: 7.6 g/dL (ref 6.0–8.5)

## 2019-08-18 LAB — HEMOGLOBIN A1C
Est. average glucose Bld gHb Est-mCnc: 174 mg/dL
Hgb A1c MFr Bld: 7.7 % — ABNORMAL HIGH (ref 4.8–5.6)

## 2019-08-18 MED ORDER — DAPAGLIFLOZIN PROPANEDIOL 5 MG PO TABS: 5.0000 mg | ORAL_TABLET | Freq: Every day | ORAL | 1 refills | Status: DC

## 2019-08-21 ENCOUNTER — Telehealth: Payer: Self-pay

## 2019-08-30 ENCOUNTER — Other Ambulatory Visit: Payer: Self-pay

## 2019-08-30 ENCOUNTER — Ambulatory Visit: Payer: Medicare Other

## 2019-08-30 DIAGNOSIS — E559 Vitamin D deficiency, unspecified: Secondary | ICD-10-CM

## 2019-08-30 DIAGNOSIS — E119 Type 2 diabetes mellitus without complications: Secondary | ICD-10-CM

## 2019-08-30 DIAGNOSIS — I10 Essential (primary) hypertension: Secondary | ICD-10-CM

## 2019-08-30 NOTE — Chronic Care Management (AMB) (Signed)
Chronic Care Management Pharmacy  Name: LYNSEE WANDS  MRN: [id number] DOB: [birth year]  Chief Complaint/ HPI  Jacqueline Bell,  69 y.o. , female presents for their Initial CCM visit with the clinical pharmacist via telephone due to COVID-19 Pandemic.  PCP : [name], FNP  Their chronic conditions include: Type 2 diabetes mellitus, seasonal and perennial allergic rhinitis  Office Visits: 08/17/19 OV: Presented for 3 month diabetes follow up. Average BG last 90 days was 147. Taking 26 units Lantus daily (ran out of [place]), Ozempic 58m weekly, and Novolog sliding scale (2-3 times per week on average). Labs ordered (HgbA1c, CMP14+EGFR). Advised pt to schedule appt with eye doctor. Fair control of HTN, continue current medications. Continue Vitamin D supplementation. Rx for Prevnar13 sent to local pharmacy. Start on Farxiga 548mdaily due to HgbA1c still above goal at 7.7.   05/17/19 AWV and OV: Presented for diabetes follow up. Taking Tresiba 20 units daily and Ozempic 42m90meekly (has been using Lantus 26 units due to running out of Tre[place] and [place]udaLabs ordered (CMP14+EGFR, Lipid panel, Vitamin D, HgbA1c). Referral to podiatry. Encouraged good water intake. Provided dietary recommendations. Continue current medications.   01/23/19 OV: Presented for diabetes follow up. Taking Tresiba 20 units daily and Ozempic 42mg94mekly (has been using Lantus 26 units over last week due to running out of Tres[place]igua and Barbudaolerating Ozempic well. Diabetic foot exam performed.   Consult Visits:  CCM Encounters: 05/17/19 PharmD: Discussed A1c and current diabetes treatment regimen. Freestyle LibrElenor Legatofunctioning properly. Recently changed to pravastatin 20mg442mhtly due to increasing myopathy with rosuvastatin.   05/09/19 RN: Comprehensive DM assessment. Pt education provided. Pt reports ADA diet adherence most of the time. Advised pt to schedule eye appt (last appt was 06/09/17 due to pandemic). Labs ordered (lipid panel,  HgbA1c, CMP14+EGFR, TSH). Dietary and exercise recommendations provided.   Medications: Outpatient Encounter Medications as of 08/30/2019  Medication Sig Note  . cholecalciferol (VITAMIN D) 1000 units tablet Take 1,000 Units by mouth daily.   . fluticasone (FLONASE) 50 MCG/ACT nasal spray USE 1 SPRAY IN EACH NOSTRIL DAILY   . insulin aspart (NOVOLOG FLEXPEN) 100 UNIT/ML FlexPen Per SS: < 70 = 0; 70-90 = 4 units; 91-130 = 6 units; 131-150 = 7 units; 151-200 = 8 units; 201-250 = 9 units; 251-300 = 10 units; 301-350 = 11 units; 351-400 = 12 units; 401-450 = 13 units; > 450 = 14 units.   . insulin degludec (TRESIBA FLEXTOUCH) 100 UNIT/ML SOPN FlexTouch Pen Inject 0.2 mLs (20 Units total) into the skin daily. (Patient taking differently: Inject 20 Units into the skin daily. Taking Lantus right now)   . loratadine (CLARITIN) 10 MG tablet Take 1 tablet (10 mg total) by mouth daily.   . magnesium oxide (MAG-OX) 400 MG tablet Take 400 mg by mouth daily.   . meloxicam (MOBIC) 15 MG tablet Take 1 tablet (15 mg total) by mouth as needed.   . Multiple Vitamin (MULTIVITAMIN) tablet Take 1 tablet by mouth daily.   . Olopatadine HCl (PAZEO) 0.7 % SOLN Place 1 drop into both eyes daily. 05/19/2018: Patient is taking as needed.   . OZEMarland KitchenPIC, 1 MG/DOSE, 2 MG/1.5ML SOPN INJECT 1 MG UNDER THE SKIN ONCE WEEKLY   . POTASSIUM BICARBONATE PO Take 1 tablet by mouth. Patient takes 2 tablets daily   . pravastatin (PRAVACHOL) 20 MG tablet Take 1 tablet (20 mg total) by mouth every evening.   . telMarland Kitchenisartan (MICARDIS) 20 MG tablet Take  1 tablet (20 mg total) by mouth daily.   . Vitamin D, Ergocalciferol, (DRISDOL) 1.25 MG (50000 UT) CAPS capsule TAKE 1 CAPSULE EVERY 7 DAYS   . [DISCONTINUED] OZEMPIC, 1 MG/DOSE, 2 MG/1.5ML SOPN INJECT 1 MG UNDER THE SKIN ONCE WEEKLY   . dapagliflozin propanediol (FARXIGA) 5 MG TABS tablet Take 1 tablet (5 mg total) by mouth daily before breakfast. (Patient not taking: Reported on 09/23/2019)   .  EPINEPHrine 0.3 mg/0.3 mL IJ SOAJ injection  (Patient not taking: Reported on 09/23/2019)   . Omega-3 Fatty Acids (FISH OIL PO) Take 2 capsules by mouth daily.     No facility-administered encounter medications on file as of 08/30/2019.    Current Diagnosis/Assessment:  SDOH Interventions     Most Recent Value  SDOH Interventions  Financial Strain Interventions Intervention Not Indicated      Goals Addressed            This Visit's Progress   . Pharmacy Care Plan       CARE PLAN ENTRY (see longitudinal plan of care for additional care plan information)  Current Barriers:  . Chronic Disease Management support, education, and care coordination needs related to Hypertension, Hyperlipidemia, Diabetes, and Vitamin D deficiency   Hypertension BP Readings from Last 3 Encounters:  08/17/19 138/78  05/17/19 (!) 142/80  05/17/19 (!) 142/80   . Pharmacist Clinical Goal(s): o Over the next 90 days, patient will work with PharmD and providers to achieve BP goal <130/80 . Current regimen:  o Telmisartan 30m daily . Interventions: o Collaborate with PCP to verify correct dosing (once daily versus every other day) o Provided dietary and exercise recommendations . Patient self care activities - Over the next 90 days, patient will: o Check BP weekly and if symptomatic, document, and provide at future appointments o Ensure daily salt intake < 2300 mg/day o Exercise 30 minutes daily 5 days per week  Hyperlipidemia Lab Results  Component Value Date/Time   LDLCALC 101 (H) 05/17/2019 02:39 PM   . Pharmacist Clinical Goal(s): o Over the next 180 days, patient will work with PharmD and providers to achieve LDL goal < 70 . Current regimen:  . Pravastatin 265mdaily . Omega-3 fatty acids 2 capsules daily . Interventions: o Provided dietary and exercise recommendations o Recommend to PCP increase to moderate-intensity statin (pravastatin 4066maily) due to patient's diabetes and  increased ASCVD risk . Patient self care activities - Over the next 180 days, patient will: o Take cholesterol medication daily o Focus on a heart healthy diet, low in saturated fat o Exercise 30 minutes daily 5 days per week  Diabetes Lab Results  Component Value Date/Time   HGBA1C 7.7 (H) 08/17/2019 12:03 PM   HGBA1C 7.6 (H) 05/17/2019 02:39 PM   . Pharmacist Clinical Goal(s): o Over the next 90 days, patient will work with PharmD and providers to achieve A1c goal <7% . Current regimen:   Ozempic 1 mg weekly  Farxiga 5mg102mily---has not started yet  Lantus 20 units daily  Novolog Flexpen Sliding scale . Interventions: o Contacted [facility] and determined that FarxWilder Glade not been delivered to patient because it requires a prior authorization o Notified PCP of Farxiga status and that prior authorization paperwork is being sent over from the health plan to be completed o Patient education provided regarding indication, mechanism of action, and appropriate use of Ozempic, Lantus, and Novolog o Provided dietary and exercise recommendations . Patient self care activities - Over the  next 90 days, patient will: o Check blood sugar 3-4 times daily, document, and provide at future appointments o Contact provider with any episodes of hypoglycemia o Begin taking Farxiga daily when delivered via [facility]   Vitamin D Deficiency . Pharmacist Clinical Goal(s) o Over the next 90 days, patient will work with PharmD and providers to maintain normal Vitamin D levels (>30) . Current regimen:   Ergocalciferol 50,000 units once weekly  Cholecalciferol 1000 units daily . Interventions: o Discuss with PCP stopping ergocalciferol since Vitamin D level has normalized and continuing cholecalciferol at increased dose of 2000 units daily . Patient self care activities - Over the next 90 days, patient will: o Continue Vitamin D supplements as directed  Medication  management . Pharmacist Clinical Goal(s): o Over the next 180 days, patient will work with PharmD and providers to achieve optimal medication adherence . Current pharmacy: [facility] . Interventions o Comprehensive medication review performed. o Continue current medication management strategy . Patient self care activities - Over the next 180 days, patient will: o Focus on medication adherence by trying a pill box to organize medications o Take medications as prescribed o Report any questions or concerns to PharmD and/or provider(s)  Initial goal documentation        Diabetes   Recent Relevant Labs: Lab Results  Component Value Date/Time   HGBA1C 7.7 (H) 08/17/2019 12:03 PM   HGBA1C 7.6 (H) 05/17/2019 02:39 PM   MICROALBUR 10 05/17/2019 12:47 PM   MICROALBUR 30 05/05/2018 01:12 PM     Checking BG: 4 times daily- FreeStyle Libre  Recent FBG Readings: 145 Recent pre-meal BG readings: 136, 142 Recent 2hr PP BG readings:  144, 148, 192 Recent HS BG readings:  Patient has failed these meds in past: Bydureon, glimepiride  Patient is currently uncontrolled on the following medications:   Ozempic 1 mg weekly  Farxiga 24m daily  Lantus 20 units daily  Novolog Flexpen Sliding scale  Last diabetic Foot exam: 01/23/19 Last diabetic Eye exam: Lab Results  Component Value Date/Time   HMDIABEYEEXA Retinopathy (A) 06/09/2017 12:00 AM    We discussed:  BG average for last 7 days: 149  BG average for last 14 days: 135  BG average for last 30 days: 137  BG average for last 90 days: 142  Denies symptoms of hypoglycemia usually, but checks when that happens  Drinks orange juice or eats peanut butter when BG is  <70  Diet extensively  Eats a lot of fruit during the summer  Counseled on sugar content of fruits and recommended low sugar fruits (Berries)  Fresh vegetables and corn   Discussed carbohydrate content of corn  Exercise  extensively  Walks every other day for 15-20 minutes  Has an ab machine she uses sometimes  Legs cramps have led to decreased exercising  Mustard "cocktail" helps with cramps  Recommend exercise goal 30 minutes 5 times weekly  Pt is still using Lantus instead of Tresiba. She had a lot of Lantus left over from before she started [place] she is going to use it up before she goes back to [place]   She uses no more than 6 units of Novolog maybe twice daily (per sliding scale)  Discussed Ozempic, Lantus, and Novolog  What types of medications they are, how they work, and when to take them  Discussed FWilder Glade Pt is drinking a lot of water   Advised about potential side effect (genitourinary fungal infections)  Encouraged  daily probiotic use  Pt has not received [place] from mail order yet  Contacted [facility] and determined Wilder Glade needs a prior authorization. Paperwork is being faxed to the office for completion. Notified PCP.  Plan Continue current medications  Assist pt with obtaining Farxiga from [place]  Hypertension   Office blood pressures are  BP Readings from Last 3 Encounters:  08/17/19 138/78  05/17/19 (!) 142/80  05/17/19 (!) 142/80   Patient has failed these meds in the past: N/A Patient is currently uncontrolled on the following medications:   Telmisartan 53m daily  Patient checks BP at home infrequently  Patient home BP readings are ranging: None to provide  We discussed:  Pt thinks her home BP machine is not reading correctly  Advised pt to check BP if symptomatic   Pt states she was advised by PCP to take telmisartan every other day due to cramps she was experiencing (rosuvastatin also changed to pravastatin)  Plan Continue current medications  Clarify telmisartan dosing with PCP   Hyperlipidemia   LDL goal < 70  Lipid Panel     Component Value Date/Time   CHOL 173 05/17/2019 1439   TRIG 100 05/17/2019 1439   HDL  54 05/17/2019 1439   LDLCALC 101 (H) 05/17/2019 1439    Hepatic Function Latest Ref Rng & Units 08/17/2019 05/17/2019 01/23/2019  Total Protein 6.0 - 8.5 g/dL 7.6 7.6 7.3  Albumin 3.8 - 4.8 g/dL 4.4 4.4 4.3  AST 0 - 40 IU/L '23 23 15  ' ALT 0 - 32 IU/L '18 17 13  ' Alk Phosphatase 48 - 121 IU/L 49 53 44  Total Bilirubin 0.0 - 1.2 mg/dL 0.4 0.4 0.3     The 10-year ASCVD risk score (Mikey BussingDC Jr., et al., 2013) is: 23.5%   Values used to calculate the score:     Age: [age]years     Sex: Female     Is Non-Hispanic African American: Yes     Diabetic: Yes     Tobacco smoker: No     Systolic Blood Pressure: 1517mmHg     Is BP treated: Yes     HDL Cholesterol: 54 mg/dL     Total Cholesterol: 173 mg/dL   Patient has failed these meds in past: Rosuvastatin (cramps) Patient is currently uncontrolled on the following medications:  . Pravastatin 260mdaily . Omega-3 fatty acids 2 capsules daily  We discussed:    Pt was changed to pravastatin from rosuvastatin and reports no issues Diet and exercise extensively  Pt eats minimal red meat  Provided diet recommendations  Plan Continue current medications  Pravastatin 2031ms a low intensity statin, recommend to PCP increasing to at least a moderate intensity (pravastatin 57m11mily) due to patient's diabetes and ASCVD risk.   Allergic Rhinitis/ Allergies    Patient has failed these meds in past: XyzaGolden Gatertec, AlleMortons Gapient is currently controlled on the following medications:   Loratadine 10mg74mly  Fluticasone 50mcg64m 1 spray in each nostril daily  Olopatadine 0.7% 1 drop in both eyes daily  We discussed:    Patient using Claritin as needed for allergies  Symptoms are controlled with current drug regimen  Plan Continue current medications   Pain   Patient has failed these meds in past: N/A Patient is currently controlled on the following medications:   Meloxicam 15 mg once daily as needed  Hemp cream and gummies  We  discussed:  Pt states that she takes meloxicam at  bedtime as needed  She uses hemp cream for pain more often than hemp gummies  Plan Continue current medications  Vitamin D Deficiency  Vitamin D level: 86.3 on 05/17/19  Patient has failed these meds in past: N/A Patient is currently controlled on the following medications:   Ergocalciferol 50,000 units once weekly  Cholecalciferol 1000 units daily  We discussed:   Pt is taking both ergocalciferol and cholecalciferol  Plan Recommend to PCP, Stop ergocalciferol ad continue cholecalciferol at a dose of 2000 units daily  Vaccines   Reviewed and discussed patient's vaccination history.    Immunization History  Administered Date(s) Administered  . Influenza, High Dose Seasonal PF 11/28/2018  . Influenza-Unspecified 11/18/2017  . PFIZER SARS-COV-2 Vaccination 04/07/2019, 05/01/2019   Plan Discuss at follow up  Medication Management   Pt uses [facility] and [place] for all medications Uses pill box? No - Keeps medications lined up in dresser drawer Pt endorses 99% compliance  We discussed:   Pt states she misses vitamin D dose once in a while  Importance of medication adherence  Plan Continue current medication management strategy  Follow up: 3 month phone visit  Jannette Fogo, PharmD Clinical Pharmacist [facility] [phone]

## 2019-09-12 ENCOUNTER — Other Ambulatory Visit: Payer: Self-pay | Admitting: Nurse Practitioner

## 2019-09-12 DIAGNOSIS — Z794 Long term (current) use of insulin: Secondary | ICD-10-CM

## 2019-09-12 DIAGNOSIS — E119 Type 2 diabetes mellitus without complications: Secondary | ICD-10-CM

## 2019-09-23 NOTE — Patient Instructions (Addendum)
Visit Information  Goals Addressed            This Visit's Progress   . Pharmacy Care Plan       CARE PLAN ENTRY (see longitudinal plan of care for additional care plan information)  Current Barriers:  . Chronic Disease Management support, education, and care coordination needs related to Hypertension, Hyperlipidemia, Diabetes, and Vitamin D deficiency   Hypertension BP Readings from Last 3 Encounters:  08/17/19 138/78  05/17/19 (!) 142/80  05/17/19 (!) 142/80   . Pharmacist Clinical Goal(s): o Over the next 90 days, patient will work with PharmD and providers to achieve BP goal <130/80 . Current regimen:  o Telmisartan 20mg  daily . Interventions: o Collaborate with PCP to verify correct dosing (once daily versus every other day) o Provided dietary and exercise recommendations . Patient self care activities - Over the next 90 days, patient will: o Check BP weekly and if symptomatic, document, and provide at future appointments o Ensure daily salt intake < 2300 mg/day o Exercise 30 minutes daily 5 days per week  Hyperlipidemia Lab Results  Component Value Date/Time   LDLCALC 101 (H) 05/17/2019 02:39 PM   . Pharmacist Clinical Goal(s): o Over the next 180 days, patient will work with PharmD and providers to achieve LDL goal < 70 . Current regimen:  . Pravastatin 20mg  daily . Omega-3 fatty acids 2 capsules daily . Interventions: o Provided dietary and exercise recommendations o Recommend to PCP increase to moderate-intensity statin (pravastatin 40mg  daily) due to patient's diabetes and increased ASCVD risk . Patient self care activities - Over the next 180 days, patient will: o Take cholesterol medication daily o Focus on a heart healthy diet, low in saturated fat o Exercise 30 minutes daily 5 days per week  Diabetes Lab Results  Component Value Date/Time   HGBA1C 7.7 (H) 08/17/2019 12:03 PM   HGBA1C 7.6 (H) 05/17/2019 02:39 PM   . Pharmacist Clinical  Goal(s): o Over the next 90 days, patient will work with PharmD and providers to achieve A1c goal <7% . Current regimen:   Ozempic 1 mg weekly  Farxiga 5mg  daily---has not started yet  Lantus 20 units daily  Novolog Flexpen Sliding scale . Interventions: o Contacted [facility] and determined that has not been delivered to patient because it requires a prior authorization o Notified PCP of Farxiga status and that prior authorization paperwork is being sent over from the health plan to be completed o Patient education provided regarding indication, mechanism of action, and appropriate use of Ozempic, Lantus, and Novolog o Provided dietary and exercise recommendations . Patient self care activities - Over the next 90 days, patient will: o Check blood sugar 3-4 times daily, document, and provide at future appointments o Contact provider with any episodes of hypoglycemia o Begin taking Farxiga daily when delivered via [facility]   Vitamin D Deficiency . Pharmacist Clinical Goal(s) o Over the next 90 days, patient will work with PharmD and providers to maintain normal Vitamin D levels (>30) . Current regimen:   Ergocalciferol 50,000 units once weekly  Cholecalciferol 1000 units daily . Interventions: o Discuss with PCP stopping ergocalciferol since Vitamin D level has normalized and continuing cholecalciferol at increased dose of 2000 units daily . Patient self care activities - Over the next 90 days, patient will: o Continue Vitamin D supplements as directed  Medication management . Pharmacist Clinical Goal(s): o Over the next 180 days, patient will work with PharmD and providers to  achieve optimal medication adherence . Current pharmacy: [facility] . Interventions o Comprehensive medication review performed. o Continue current medication management strategy . Patient self care activities - Over the next 180 days, patient will: o Focus on  medication adherence by trying a pill box to organize medications o Take medications as prescribed o Report any questions or concerns to PharmD and/or provider(s)  Initial goal documentation        Ms. Slight was given information about Chronic Care Management services today including:  1. CCM service includes personalized support from designated clinical staff supervised by her physician, including individualized plan of care and coordination with other care providers 2. 24/7 contact phone numbers for assistance for urgent and routine care needs. 3. Standard insurance, coinsurance, copays and deductibles apply for chronic care management only during months in which we provide at least 20 minutes of these services. Most insurances cover these services at 100%, however patients may be responsible for any copay, coinsurance and/or deductible if applicable. This service may help you avoid the need for more expensive face-to-face services. 4. Only one practitioner may furnish and bill the service in a calendar month. 5. The patient may stop CCM services at any time (effective at the end of the month) by phone call to the office staff.  Patient agreed to services and verbal consent obtained.   The patient verbalized understanding of instructions provided today and agreed to receive a mailed copy of patient instruction and/or educational materials. Telephone follow up appointment with pharmacy team member scheduled for: 11/29/19 @ 3:30 PM  Beryle Flock, PharmD Clinical Pharmacist [facility] [phone]   Diabetes Mellitus and Nutrition, Adult When you have diabetes (diabetes mellitus), it is very important to have healthy eating habits because your blood sugar (glucose) levels are greatly affected by what you eat and drink. Eating healthy foods in the appropriate amounts, at about the same times every day, can help you:  Control your blood glucose.  Lower your  risk of heart disease.  Improve your blood pressure.  Reach or maintain a healthy weight. Every person with diabetes is different, and each person has different needs for a meal plan. Your health care provider may recommend that you work with a diet and nutrition specialist (dietitian) to make a meal plan that is best for you. Your meal plan may vary depending on factors such as:  The calories you need.  The medicines you take.  Your weight.  Your blood glucose, blood pressure, and cholesterol levels.  Your activity level.  Other health conditions you have, such as heart or kidney disease. How do carbohydrates affect me? Carbohydrates, also called carbs, affect your blood glucose level more than any other type of food. Eating carbs naturally raises the amount of glucose in your blood. Carb counting is a method for keeping track of how many carbs you eat. Counting carbs is important to keep your blood glucose at a healthy level, especially if you use insulin or take certain oral diabetes medicines. It is important to know how many carbs you can safely have in each meal. This is different for every person. Your dietitian can help you calculate how many carbs you should have at each meal and for each snack. Foods that contain carbs include:  Bread, cereal, rice, pasta, and crackers.  Potatoes and corn.  Peas, beans, and lentils.  Milk and yogurt.  Fruit and juice.  Desserts, such as cakes, cookies, ice cream, and candy.  How does alcohol affect me? Alcohol can cause a sudden decrease in blood glucose (hypoglycemia), especially if you use insulin or take certain oral diabetes medicines. Hypoglycemia can be a life-threatening condition. Symptoms of hypoglycemia (sleepiness, dizziness, and confusion) are similar to symptoms of having too much alcohol. If your health care provider says that alcohol is safe for you, follow these guidelines:  Limit alcohol intake to no more than 1 drink  per day for nonpregnant women and 2 drinks per day for men. One drink equals 12 oz of beer, 5 oz of wine, or 1 oz of hard liquor.  Do not drink on an empty stomach.  Keep yourself hydrated with water, diet soda, or unsweetened iced tea.  Keep in mind that regular soda, juice, and other mixers may contain a lot of sugar and must be counted as carbs. What are tips for following this plan?  Reading food labels  Start by checking the serving size on the "Nutrition Facts" label of packaged foods and drinks. The amount of calories, carbs, fats, and other nutrients listed on the label is based on one serving of the item. Many items contain more than one serving per package.  Check the total grams (g) of carbs in one serving. You can calculate the number of servings of carbs in one serving by dividing the total carbs by 15. For example, if a food has 30 g of total carbs, it would be equal to 2 servings of carbs.  Check the number of grams (g) of saturated and trans fats in one serving. Choose foods that have low or no amount of these fats.  Check the number of milligrams (mg) of salt (sodium) in one serving. Most people should limit total sodium intake to less than 2,300 mg per day.  Always check the nutrition information of foods labeled as "low-fat" or "nonfat". These foods may be higher in added sugar or refined carbs and should be avoided.  Talk to your dietitian to identify your daily goals for nutrients listed on the label. Shopping  Avoid buying canned, premade, or processed foods. These foods tend to be high in fat, sodium, and added sugar.  Shop around the outside edge of the grocery store. This includes fresh fruits and vegetables, bulk grains, fresh meats, and fresh dairy. Cooking  Use low-heat cooking methods, such as baking, instead of high-heat cooking methods like deep frying.  Cook using healthy oils, such as olive, canola, or sunflower oil.  Avoid cooking with butter,  cream, or high-fat meats. Meal planning  Eat meals and snacks regularly, preferably at the same times every day. Avoid going long periods of time without eating.  Eat foods high in fiber, such as fresh fruits, vegetables, beans, and whole grains. Talk to your dietitian about how many servings of carbs you can eat at each meal.  Eat 4-6 ounces (oz) of lean protein each day, such as lean meat, chicken, fish, eggs, or tofu. One oz of lean protein is equal to: ? 1 oz of meat, chicken, or fish. ? 1 egg. ?  cup of tofu.  Eat some foods each day that contain healthy fats, such as avocado, nuts, seeds, and fish. Lifestyle  Check your blood glucose regularly.  Exercise regularly as told by your health care provider. This may include: ? 150 minutes of moderate-intensity or vigorous-intensity exercise each week. This could be brisk walking, biking, or water aerobics. ? Stretching and doing strength exercises, such as yoga or weightlifting, at least  2 times a week.  Take medicines as told by your health care provider.  Do not use any products that contain nicotine or tobacco, such as cigarettes and e-cigarettes. If you need help quitting, ask your health care provider.  Work with a Veterinary surgeoncounselor or diabetes educator to identify strategies to manage stress and any emotional and social challenges. Questions to ask a health care provider  Do I need to meet with a diabetes educator?  Do I need to meet with a dietitian?  What number can I call if I have questions?  When are the best times to check my blood glucose? Where to find more information:  American Diabetes Association: diabetes.org  Academy of Nutrition and Dietetics: www.eatright.[employer]org  National Institute of Diabetes and Digestive and Kidney Diseases (NIH): [employer].tnwww.niddk.nih.gov Summary  A healthy meal plan will help you control your blood glucose and maintain a healthy lifestyle.  Working with a diet and nutrition specialist  (dietitian) can help you make a meal plan that is best for you.  Keep in mind that carbohydrates (carbs) and alcohol have immediate effects on your blood glucose levels. It is important to count carbs and to use alcohol carefully. This information is not intended to replace advice given to you by your health care provider. Make sure you discuss any questions you have with your health care provider. Document Revised: 02/05/2017 Document Reviewed: 03/30/2016 Elsevier Patient Education  2020 [employer]

## 2019-09-26 ENCOUNTER — Telehealth: Payer: Self-pay

## 2019-10-24 ENCOUNTER — Other Ambulatory Visit: Payer: Self-pay

## 2019-10-24 ENCOUNTER — Other Ambulatory Visit: Payer: TRICARE For Life (TFL)

## 2019-10-24 DIAGNOSIS — J069 Acute upper respiratory infection, unspecified: Secondary | ICD-10-CM | POA: Diagnosis not present

## 2019-10-24 DIAGNOSIS — Z20822 Contact with and (suspected) exposure to covid-19: Secondary | ICD-10-CM | POA: Diagnosis not present

## 2019-10-24 DIAGNOSIS — J209 Acute bronchitis, unspecified: Secondary | ICD-10-CM | POA: Diagnosis not present

## 2019-10-25 LAB — SARS-COV-2, NAA 2 DAY TAT

## 2019-10-25 LAB — NOVEL CORONAVIRUS, NAA: SARS-CoV-2, NAA: NOT DETECTED

## 2019-10-26 ENCOUNTER — Other Ambulatory Visit: Payer: Self-pay | Admitting: Nurse Practitioner

## 2019-10-26 DIAGNOSIS — E119 Type 2 diabetes mellitus without complications: Secondary | ICD-10-CM

## 2019-10-26 DIAGNOSIS — Z794 Long term (current) use of insulin: Secondary | ICD-10-CM

## 2019-10-26 MED ORDER — PRAVASTATIN SODIUM 40 MG PO TABS: 40.0000 mg | ORAL_TABLET | Freq: Every evening | ORAL | 1 refills | Status: DC

## 2019-10-27 DIAGNOSIS — J209 Acute bronchitis, unspecified: Secondary | ICD-10-CM | POA: Diagnosis not present

## 2019-10-27 DIAGNOSIS — J069 Acute upper respiratory infection, unspecified: Secondary | ICD-10-CM | POA: Diagnosis not present

## 2019-11-01 ENCOUNTER — Other Ambulatory Visit: Payer: Self-pay | Admitting: Nurse Practitioner

## 2019-11-23 ENCOUNTER — Other Ambulatory Visit: Payer: Self-pay

## 2019-11-23 ENCOUNTER — Encounter: Payer: Self-pay | Admitting: Nurse Practitioner

## 2019-11-23 ENCOUNTER — Ambulatory Visit (INDEPENDENT_AMBULATORY_CARE_PROVIDER_SITE_OTHER): Payer: Medicare Other | Admitting: Nurse Practitioner

## 2019-11-23 VITALS — BP 116/80 | HR 83 | Temp 98.2°F | Ht 66.0 in | Wt 192.6 lb

## 2019-11-23 DIAGNOSIS — Z23 Encounter for immunization: Secondary | ICD-10-CM

## 2019-11-23 DIAGNOSIS — Z794 Long term (current) use of insulin: Secondary | ICD-10-CM

## 2019-11-23 DIAGNOSIS — E119 Type 2 diabetes mellitus without complications: Secondary | ICD-10-CM | POA: Diagnosis not present

## 2019-11-23 DIAGNOSIS — E559 Vitamin D deficiency, unspecified: Secondary | ICD-10-CM

## 2019-11-23 DIAGNOSIS — I10 Essential (primary) hypertension: Secondary | ICD-10-CM

## 2019-11-23 MED ORDER — TRESIBA FLEXTOUCH 100 UNIT/ML ~~LOC~~ SOPN: 20.0000 [IU] | PEN_INJECTOR | Freq: Every day | SUBCUTANEOUS | 1 refills | Status: DC

## 2019-11-23 NOTE — Patient Instructions (Signed)
Hypertension, Adult Hypertension is another name for high blood pressure. High blood pressure forces your heart to work harder to pump blood. This can cause problems over time. There are two numbers in a blood pressure reading. There is a top number (systolic) over a bottom number (diastolic). It is best to have a blood pressure that is below 120/80. Healthy choices can help lower your blood pressure, or you may need medicine to help lower it. What are the causes? The cause of this condition is not known. Some conditions may be related to high blood pressure. What increases the risk?  Smoking.  Having type 2 diabetes mellitus, high cholesterol, or both.  Not getting enough exercise or physical activity.  Being overweight.  Having too much fat, sugar, calories, or salt (sodium) in your diet.  Drinking too much alcohol.  Having long-term (chronic) kidney disease.  Having a family history of high blood pressure.  Age. Risk increases with age.  Race. You may be at higher risk if you are African American.  Gender. Men are at higher risk than women before age 45. After age 65, women are at higher risk than men.  Having obstructive sleep apnea.  Stress. What are the signs or symptoms?  High blood pressure may not cause symptoms. Very high blood pressure (hypertensive crisis) may cause: ? Headache. ? Feelings of worry or nervousness (anxiety). ? Shortness of breath. ? Nosebleed. ? A feeling of being sick to your stomach (nausea). ? Throwing up (vomiting). ? Changes in how you see. ? Very bad chest pain. ? Seizures. How is this treated?  This condition is treated by making healthy lifestyle changes, such as: ? Eating healthy foods. ? Exercising more. ? Drinking less alcohol.  Your health care provider may prescribe medicine if lifestyle changes are not enough to get your blood pressure under control, and if: ? Your top number is above 130. ? Your bottom number is above  80.  Your personal target blood pressure may vary. Follow these instructions at home: Eating and drinking   If told, follow the DASH eating plan. To follow this plan: ? Fill one half of your plate at each meal with fruits and vegetables. ? Fill one fourth of your plate at each meal with whole grains. Whole grains include whole-wheat pasta, brown rice, and whole-grain bread. ? Eat or drink low-fat dairy products, such as skim milk or low-fat yogurt. ? Fill one fourth of your plate at each meal with low-fat (lean) proteins. Low-fat proteins include fish, chicken without skin, eggs, beans, and tofu. ? Avoid fatty meat, cured and processed meat, or chicken with skin. ? Avoid pre-made or processed food.  Eat less than 1,500 mg of salt each day.  Do not drink alcohol if: ? Your doctor tells you not to drink. ? You are pregnant, may be pregnant, or are planning to become pregnant.  If you drink alcohol: ? Limit how much you use to:  0-1 drink a day for women.  0-2 drinks a day for men. ? Be aware of how much alcohol is in your drink. In the [place] one drink equals one 12 oz bottle of beer (355 mL), one 5 oz glass of wine (148 mL), or one 1 oz glass of hard liquor (44 mL). Lifestyle   Work with your doctor to stay at a healthy weight or to lose weight. Ask your doctor what the best weight is for you.  Get at least 30 minutes of exercise most   days of the week. This may include walking, swimming, or biking.  Get at least 30 minutes of exercise that strengthens your muscles (resistance exercise) at least 3 days a week. This may include lifting weights or doing Pilates.  Do not use any products that contain nicotine or tobacco, such as cigarettes, e-cigarettes, and chewing tobacco. If you need help quitting, ask your doctor.  Check your blood pressure at home as told by your doctor.  Keep all follow-up visits as told by your doctor. This is important. Medicines  Take over-the-counter  and prescription medicines only as told by your doctor. Follow directions carefully.  Do not skip doses of blood pressure medicine. The medicine does not work as well if you skip doses. Skipping doses also puts you at risk for problems.  Ask your doctor about side effects or reactions to medicines that you should watch for. Contact a doctor if you:  Think you are having a reaction to the medicine you are taking.  Have headaches that keep coming back (recurring).  Feel dizzy.  Have swelling in your ankles.  Have trouble with your vision. Get help right away if you:  Get a very bad headache.  Start to feel mixed up (confused).  Feel weak or numb.  Feel faint.  Have very bad pain in your: ? Chest. ? Belly (abdomen).  Throw up more than once.  Have trouble breathing. Summary  Hypertension is another name for high blood pressure.  High blood pressure forces your heart to work harder to pump blood.  For most people, a normal blood pressure is less than 120/80.  Making healthy choices can help lower blood pressure. If your blood pressure does not get lower with healthy choices, you may need to take medicine. This information is not intended to replace advice given to you by your health care provider. Make sure you discuss any questions you have with your health care provider. Document Revised: 11/03/2017 Document Reviewed: 11/03/2017 Elsevier Patient Education  2020 Elsevier Inc. Diabetes Basics  Diabetes (diabetes mellitus) is a long-term (chronic) disease. It occurs when the body does not properly use sugar (glucose) that is released from food after you eat. Diabetes may be caused by one or both of these problems:  Your pancreas does not make enough of a hormone called insulin.  Your body does not react in a normal way to insulin that it makes. Insulin lets sugars (glucose) go into cells in your body. This gives you energy. If you have diabetes, sugars cannot get into  cells. This causes high blood sugar (hyperglycemia). Follow these instructions at home: How is diabetes treated? You may need to take insulin or other diabetes medicines daily to keep your blood sugar in balance. Take your diabetes medicines every day as told by your doctor. List your diabetes medicines here: Diabetes medicines  Name of medicine: ______________________________ ? Amount (dose): _______________ Time (a.m./p.m.): _______________ Notes: ___________________________________  Name of medicine: ______________________________ ? Amount (dose): _______________ Time (a.m./p.m.): _______________ Notes: ___________________________________  Name of medicine: ______________________________ ? Amount (dose): _______________ Time (a.m./p.m.): _______________ Notes: ___________________________________ If you use insulin, you will learn how to give yourself insulin by injection. You may need to adjust the amount based on the food that you eat. List the types of insulin you use here: Insulin  Insulin type: ______________________________ ? Amount (dose): _______________ Time (a.m./p.m.): _______________ Notes: ___________________________________  Insulin type: ______________________________ ? Amount (dose): _______________ Time (a.m./p.m.): _______________ Notes: ___________________________________  Insulin type: ______________________________ ? Amount (dose):   _______________ Time (a.m./p.m.): _______________ Notes: ___________________________________  Insulin type: ______________________________ ? Amount (dose): _______________ Time (a.m./p.m.): _______________ Notes: ___________________________________  Insulin type: ______________________________ ? Amount (dose): _______________ Time (a.m./p.m.): _______________ Notes: ___________________________________ How do I manage my blood sugar?  Check your blood sugar levels using a blood glucose monitor as directed by your doctor. Your doctor  will set treatment goals for you. Generally, you should have these blood sugar levels:  Before meals (preprandial): 80-130 mg/dL (4.4-7.2 mmol/L).  After meals (postprandial): below 180 mg/dL (10 mmol/L).  A1c level: less than 7%. Write down the times that you will check your blood sugar levels: Blood sugar checks  Time: _______________ Notes: ___________________________________  Time: _______________ Notes: ___________________________________  Time: _______________ Notes: ___________________________________  Time: _______________ Notes: ___________________________________  Time: _______________ Notes: ___________________________________  Time: _______________ Notes: ___________________________________  What do I need to know about low blood sugar? Low blood sugar is called hypoglycemia. This is when blood sugar is at or below 70 mg/dL (3.9 mmol/L). Symptoms may include:  Feeling: ? Hungry. ? Worried or nervous (anxious). ? Sweaty and clammy. ? Confused. ? Dizzy. ? Sleepy. ? Sick to your stomach (nauseous).  Having: ? A fast heartbeat. ? A headache. ? A change in your vision. ? Tingling or no feeling (numbness) around the mouth, lips, or tongue. ? Jerky movements that you cannot control (seizure).  Having trouble with: ? Moving (coordination). ? Sleeping. ? Passing out (fainting). ? Getting upset easily (irritability). Treating low blood sugar To treat low blood sugar, eat or drink something sugary right away. If you can think clearly and swallow safely, follow the 15:15 rule:  Take 15 grams of a fast-acting carb (carbohydrate). Talk with your doctor about how much you should take.  Some fast-acting carbs are: ? Sugar tablets (glucose pills). Take 3-4 glucose pills. ? 6-8 pieces of hard candy. ? 4-6 oz (120-150 mL) of fruit juice. ? 4-6 oz (120-150 mL) of regular (not diet) soda. ? 1 Tbsp (15 mL) honey or sugar.  Check your blood sugar 15 minutes after you  take the carb.  If your blood sugar is still at or below 70 mg/dL (3.9 mmol/L), take 15 grams of a carb again.  If your blood sugar does not go above 70 mg/dL (3.9 mmol/L) after 3 tries, get help right away.  After your blood sugar goes back to normal, eat a meal or a snack within 1 hour. Treating very low blood sugar If your blood sugar is at or below 54 mg/dL (3 mmol/L), you have very low blood sugar (severe hypoglycemia). This is an emergency. Do not wait to see if the symptoms will go away. Get medical help right away. Call your local emergency services (911 in the [place] Do not drive yourself to the hospital. Questions to ask your health care provider  Do I need to meet with a diabetes educator?  What equipment will I need to care for myself at home?  What diabetes medicines do I need? When should I take them?  How often do I need to check my blood sugar?  What number can I call if I have questions?  When is my next doctor's visit?  Where can I find a support group for people with diabetes? Where to find more information  American Diabetes Association: www.diabetes.org  American Association of Diabetes Educators: www.diabeteseducator.org/patient-resources Contact a doctor if:  Your blood sugar is at or above 240 mg/dL (13.3 mmol/L) for 2 days in a row.  You have   been sick or have had a fever for 2 days or more, and you are not getting better.  You have any of these problems for more than 6 hours: ? You cannot eat or drink. ? You feel sick to your stomach (nauseous). ? You throw up (vomit). ? You have watery poop (diarrhea). Get help right away if:  Your blood sugar is lower than 54 mg/dL (3 mmol/L).  You get confused.  You have trouble: ? Thinking clearly. ? Breathing. Summary  Diabetes (diabetes mellitus) is a long-term (chronic) disease. It occurs when the body does not properly use sugar (glucose) that is released from food after digestion.  Take insulin  and diabetes medicines as told.  Check your blood sugar every day, as often as told.  Keep all follow-up visits as told by your doctor. This is important. This information is not intended to replace advice given to you by your health care provider. Make sure you discuss any questions you have with your health care provider. Document Revised: 11/16/2018 Document Reviewed: 05/28/2017 Elsevier Patient Education  2020 Elsevier Inc.  

## 2019-11-23 NOTE — Progress Notes (Signed)
I,Yamilka Roman [employer] as a Education administrator for [employer], FNP.,have documented all relevant documentation on the behalf of [name], FNP,as directed by  [name], FNP while in the presence of [name], [place].  This visit occurred during the SARS-CoV-2 public health emergency.  Safety protocols were in place, including screening questions prior to the visit, additional usage of staff PPE, and extensive cleaning of exam room while observing appropriate contact time as indicated for disinfecting solutions.  Subjective:     Patient ID: Jacqueline Bell , female    DOB: [birth year] , 69 y.o.   MRN: [id number]   Chief Complaint  Patient presents with  . Diabetes  . Hypertension    HPI  Here today for her 3 month diabetes and blood pressure follow up.    Wt Readings from Last 3 Encounters: 11/23/19 : 192 lb 9.6 oz (87.4 kg) 08/17/19 : 194 lb (88 kg) 05/17/19 : 194 lb (88 kg)  She was recently treated for bronchitis with  Cefdinir and levofloxacin antibiotics which caused her blood sugars to go up but they are now coming down.       Diabetes She presents for her follow-up diabetic visit. She has type 2 diabetes mellitus. Her disease course has been stable. Pertinent negatives for hypoglycemia include no confusion, dizziness or headaches. There are no diabetic associated symptoms. Pertinent negatives for diabetes include no chest pain, no fatigue, no polydipsia, no polyphagia and no polyuria. There are no hypoglycemic complications. Symptoms are stable. There are no diabetic complications. Risk factors for coronary artery disease include sedentary lifestyle and obesity. Current diabetic treatment includes oral agent (dual therapy). She is compliant with treatment all of the time. Her weight is stable. She is following a generally healthy diet. When asked about meal planning, she reported none. She has not had a previous visit with a dietitian. She rarely participates in exercise.  There is no change in her home blood glucose trend. (She is now using the [place] she left her Elenor Legato in the car.  Her today was 134.  ) An ACE inhibitor/angiotensin II receptor blocker is being taken. She does not see a podiatrist.Eye exam is not current (last done in 2019).  Hypertension This is a chronic problem. The current episode started more than 1 year ago. The problem is unchanged. The problem is uncontrolled. Pertinent negatives include no anxiety, chest pain, headaches or palpitations. Risk factors for coronary artery disease include obesity, sedentary lifestyle and diabetes mellitus. Past treatments include ACE inhibitors. The current treatment provides mild improvement. There are no compliance problems.  There is no history of angina or kidney disease. There is no history of chronic renal disease.     Past Medical History:  Diagnosis Date  . Diabetes mellitus without complication ([place])      History reviewed. No pertinent family history.   Current Outpatient Medications:  .  cholecalciferol (VITAMIN D) 1000 units tablet, Take 1,000 Units by mouth daily., Disp: , Rfl:  .  fluticasone (FLONASE) 50 MCG/ACT nasal spray, USE 1 SPRAY IN EACH NOSTRIL DAILY, Disp: 16 g, Rfl: 0 .  insulin aspart (NOVOLOG FLEXPEN) 100 UNIT/ML FlexPen, Per SS: < 70 = 0; 70-90 = 4 units; 91-130 = 6 units; 131-150 = 7 units; 151-200 = 8 units; 201-250 = 9 units; 251-300 = 10 units; 301-350 = 11 units; 351-400 = 12 units; 401-450 = 13 units; > 450 = 14 units., Disp: 15 mL, Rfl: 11 .  insulin degludec (TRESIBA FLEXTOUCH) 100  UNIT/ML FlexTouch Pen, Inject 20 Units into the skin daily., Disp: 9 mL, Rfl: 1 .  loratadine (CLARITIN) 10 MG tablet, Take 1 tablet (10 mg total) by mouth daily., Disp: 90 tablet, Rfl: 1 .  magnesium oxide (MAG-OX) 400 MG tablet, Take 400 mg by mouth daily., Disp: , Rfl:  .  meloxicam (MOBIC) 15 MG tablet, Take 1 tablet (15 mg total) by mouth as needed., Disp: 90 tablet, Rfl: 1 .  Multiple  Vitamin (MULTIVITAMIN) tablet, Take 1 tablet by mouth daily., Disp: , Rfl:  .  Olopatadine HCl (PAZEO) 0.7 % SOLN, Place 1 drop into both eyes daily., Disp: 1 Bottle, Rfl: 5 .  Omega-3 Fatty Acids (FISH OIL PO), Take 2 capsules by mouth daily. , Disp: , Rfl:  .  OZEMPIC, 1 MG/DOSE, 2 MG/1.5ML SOPN, INJECT 1 MG UNDER THE SKIN ONCE WEEKLY, Disp: 9 mL, Rfl: 3 .  POTASSIUM BICARBONATE PO, Take 1 tablet by mouth. Patient takes 2 tablets daily, Disp: , Rfl:  .  pravastatin (PRAVACHOL) 40 MG tablet, Take 1 tablet (40 mg total) by mouth every evening., Disp: 90 tablet, Rfl: 1 .  telmisartan (MICARDIS) 20 MG tablet, Take 1 tablet (20 mg total) by mouth daily., Disp: 90 tablet, Rfl: 1 .  dapagliflozin propanediol (FARXIGA) 5 MG TABS tablet, Take 1 tablet (5 mg total) by mouth daily before breakfast. (Patient not taking: Reported on 09/23/2019), Disp: 90 tablet, Rfl: 1 .  EPINEPHrine 0.3 mg/0.3 mL IJ SOAJ injection, , Disp: , Rfl:    Allergies  Allergen Reactions  . Iodine Hives  . Shellfish Allergy Hives     Review of Systems  Constitutional: Negative for fatigue.  Respiratory: Negative.   Cardiovascular: Negative for chest pain and palpitations.  Endocrine: Negative for polydipsia, polyphagia and polyuria.  Neurological: Negative for dizziness and headaches.  Psychiatric/Behavioral: Negative for confusion.     Today's Vitals   11/23/19 1100  BP: 116/80  Pulse: 83  Temp: 98.2 F (36.8 C)  TempSrc: Oral  Weight: 192 lb 9.6 oz (87.4 kg)  Height: _0  (1.676 m)  PainSc: 0-No pain   Body mass index is 31.09 kg/m.   Objective:  Physical Exam Cardiovascular:     Rate and Rhythm: Normal rate and regular rhythm.     Pulses: Normal pulses.     Heart sounds: Normal heart sounds. No murmur heard.   Neurological:     General: No focal deficit present.     Mental Status: She is oriented to person, place, and time.     Cranial Nerves: No cranial nerve deficit.  Psychiatric:        Mood and  Affect: Mood normal.        Behavior: Behavior normal.        Thought Content: Thought content normal.        Judgment: Judgment normal.         Assessment And Plan:     1. Type 2 diabetes mellitus without complication, with long-term current use of insulin (HCC)  Chronic, was slightly worse at last visit  I will try to get her approved for Tresiba, samples given. She has been taking Lantus.  - insulin degludec (TRESIBA FLEXTOUCH) 100 UNIT/ML FlexTouch Pen; Inject 20 Units into the skin daily.  Dispense: 9 mL; Refill: 1 - Hemoglobin A1c - Lipid panel - CMP14+EGFR  2. Essential hypertension . B/P is well controlled.  . CMP ordered to check renal function.  . The importance of regular exercise  and dietary modification was stressed to the patient.  - CMP14+EGFR  3. Vitamin D deficiency  Will check vitamin D level and supplement as needed.     Also encouraged to spend 15 minutes in the sun daily.   Was normal the last several times obtained - VITAMIN D 25 Hydroxy (Vit-D Deficiency, Fractures)  4. Need for influenza vaccination  Influenza vaccine administered  Encouraged to take Tylenol as needed for fever or muscle aches. - Flu Vaccine QUAD High Dose(Fluad)     Patient was given opportunity to ask questions. Patient verbalized understanding of the plan and was able to repeat key elements of the plan. All questions were answered to their satisfaction.  [name], FNP   I, [name], FNP, have reviewed all documentation for this visit. The documentation on 11/26/19 for the exam, diagnosis, procedures, and orders are all accurate and complete.  THE PATIENT IS ENCOURAGED TO PRACTICE SOCIAL DISTANCING DUE TO THE COVID-19 PANDEMIC.

## 2019-11-24 ENCOUNTER — Telehealth: Payer: Medicare Other

## 2019-11-24 ENCOUNTER — Telehealth: Payer: Self-pay

## 2019-11-24 LAB — LIPID PANEL
Chol/HDL Ratio: 2.8 ratio (ref 0.0–4.4)
Cholesterol, Total: 155 mg/dL (ref 100–199)
HDL: 56 mg/dL (ref >39)
LDL Chol Calc (NIH): 84 mg/dL (ref 0–99)
Triglycerides: 77 mg/dL (ref 0–149)
VLDL Cholesterol Cal: 15 mg/dL (ref 5–40)

## 2019-11-24 LAB — CMP14+EGFR
ALT: 18 IU/L (ref 0–32)
AST: 21 IU/L (ref 0–40)
Albumin/Globulin Ratio: 1.6 (ref 1.2–2.2)
Albumin: 4.5 g/dL (ref 3.8–4.8)
Alkaline Phosphatase: 57 IU/L (ref 44–121)
BUN/Creatinine Ratio: 11 — ABNORMAL LOW (ref 12–28)
BUN: 8 mg/dL (ref 8–27)
Bilirubin Total: 0.2 mg/dL (ref 0.0–1.2)
CO2: 26 mmol/L (ref 20–29)
Calcium: 9.1 mg/dL (ref 8.7–10.3)
Chloride: 101 mmol/L (ref 96–106)
Creatinine, Ser: 0.73 mg/dL (ref 0.57–1.00)
GFR calc Af Amer: 99 mL/min/{1.73_m2} (ref >59)
GFR calc non Af Amer: 85 mL/min/{1.73_m2} (ref >59)
Globulin, Total: 2.8 g/dL (ref 1.5–4.5)
Glucose: 139 mg/dL — ABNORMAL HIGH (ref 65–99)
Potassium: 4.3 mmol/L (ref 3.5–5.2)
Sodium: 139 mmol/L (ref 134–144)
Total Protein: 7.3 g/dL (ref 6.0–8.5)

## 2019-11-24 LAB — HEMOGLOBIN A1C
Est. average glucose Bld gHb Est-mCnc: 183 mg/dL
Hgb A1c MFr Bld: 8 % — ABNORMAL HIGH (ref 4.8–5.6)

## 2019-11-24 LAB — VITAMIN D 25 HYDROXY (VIT D DEFICIENCY, FRACTURES): Vit D, 25-Hydroxy: 46.6 ng/mL (ref 30.0–100.0)

## 2019-11-24 NOTE — Telephone Encounter (Signed)
  Chronic Care Management   Outreach Note  11/24/2019 Name: Jacqueline Bell MRN: [id number] DOB: 03-03-1951  Referred by: Arnette Felts, FNP Reason for referral : Care Coordination   An unsuccessful telephone outreach was attempted today. The patient was referred to the case management team for assistance with care management and care coordination.   Follow Up Plan: A HIPAA compliant phone message was left for the patient providing contact information and requesting a return call.  The care management team will reach out to the patient again over the next 30 days.   Bevelyn Ngo, BSW, CDP Social Worker, Certified Dementia Practitioner [facility] Care Management [phone]

## 2019-11-29 ENCOUNTER — Telehealth: Payer: Self-pay

## 2019-11-29 NOTE — Chronic Care Management (AMB) (Deleted)
Chronic Care Management Pharmacy  Name: Jacqueline LORENSEN  MRN: [id number] DOB: 1950/11/10  Chief Complaint/ HPI  Jacqueline Bell,  69 y.o. , female presents for their Follow-Up CCM visit with the clinical pharmacist via telephone due to COVID-19 Pandemic.  PCP : [name], FNP  Their chronic conditions include: Type 2 diabetes mellitus, seasonal and perennial allergic rhinitis  Office Visits: 11/23/19 OV: DM and HTN follow up. Recently treated for bronchitis with cefdiir and levofloxacin which caused elevated blood sugars, but they are coming down now. HgbA1c up to 8%. Switch to [place] from Lantus (20 units daily). Vitamin D lower at 46.6, restart Ergocalciferol once weekly. Cholesterol and kidney function normal. BP well controlled. HD flu vaccine administered today.   11/01/19 Ergocalciferol 50000 units discontinued per PharmD recommendation. Recommend switch to cholecalciferol 2000 units daily.  10/26/19 Pravastatin increased from 45m to 46mdaily per PharmD recommendation  08/17/19 OV: Presented for 3 month diabetes follow up. Average BG last 90 days was 147. Taking 26 units Lantus daily (ran out of [place] Ozempic 71m27meekly, and Novolog sliding scale (2-3 times per week on average). Labs ordered (HgbA1c, CMP14+EGFR). Advised pt to schedule appt with eye doctor. Fair control of HTN, continue current medications. Continue Vitamin D supplementation. Rx for Prevnar13 sent to local pharmacy. Start on Farxiga 5mg62mily due to HgbA1c still above goal at 7.7.   05/17/19 AWV and OV: Presented for diabetes follow up. Taking Tresiba 20 units daily and Ozempic 71mg 66mkly (has been using Lantus 26 units due to running out of [place] ordered (CMP14+EGFR, Lipid panel, Vitamin D, HgbA1c). Referral to podiatry. Encouraged good water intake. Provided dietary recommendations. Continue current medications.   01/23/19 OV: Presented for diabetes follow up. Taking Tresiba 20 units daily and  Ozempic 71mg w36mly (has been using Lantus 26 units over last week due to running out of TresibAntigua and Barbudaerating Ozempic well. Diabetic foot exam performed.   Consult Visits: 10/24/19 COVID test negative  CCM Encounters: 05/17/19 PharmD: Discussed A1c and current diabetes treatment regimen. Freestyle Libre Elenor Legatonctioning properly. Recently changed to pravastatin 20mg n37mly due to increasing myopathy with rosuvastatin.   05/09/19 RN: Comprehensive DM assessment. Pt education provided. Pt reports ADA diet adherence most of the time. Advised pt to schedule eye appt (last appt was 06/09/17 due to pandemic). Labs ordered (lipid panel, HgbA1c, CMP14+EGFR, TSH). Dietary and exercise recommendations provided.   Medications: Outpatient Encounter Medications as of 11/29/2019  Medication Sig Note  . cholecalciferol (VITAMIN D) 1000 units tablet Take 1,000 Units by mouth daily.   . dapagliflozin propanediol (FARXIGA) 5 MG TABS tablet Take 1 tablet (5 mg total) by mouth daily before breakfast. (Patient not taking: Reported on 09/23/2019)   . EPINEPHrine 0.3 mg/0.3 mL IJ SOAJ injection  (Patient not taking: Reported on 09/23/2019)   . fluticasone (FLONASE) 50 MCG/ACT nasal spray USE 1 SPRAY IN EACH NOSTRIL DAILY   . insulin aspart (NOVOLOG FLEXPEN) 100 UNIT/ML FlexPen Per SS: < 70 = 0; 70-90 = 4 units; 91-130 = 6 units; 131-150 = 7 units; 151-200 = 8 units; 201-250 = 9 units; 251-300 = 10 units; 301-350 = 11 units; 351-400 = 12 units; 401-450 = 13 units; > 450 = 14 units.   . insulin degludec (TRESIBA FLEXTOUCH) 100 UNIT/ML FlexTouch Pen Inject 20 Units into the skin daily.   . loratMarland Kitchendine (CLARITIN) 10 MG tablet Take 1 tablet (10 mg total) by mouth daily.   . magnesium oxide (MAG-OX) 400 MG  tablet Take 400 mg by mouth daily.   . meloxicam (MOBIC) 15 MG tablet Take 1 tablet (15 mg total) by mouth as needed.   . Multiple Vitamin (MULTIVITAMIN) tablet Take 1 tablet by mouth daily.   . Olopatadine HCl (PAZEO) 0.7 % SOLN  Place 1 drop into both eyes daily. 05/19/2018: Patient is taking as needed.   . Omega-3 Fatty Acids (FISH OIL PO) Take 2 capsules by mouth daily.    [name] OZEMPIC, 1 MG/DOSE, 2 MG/1.5ML SOPN INJECT 1 MG UNDER THE SKIN ONCE WEEKLY   . POTASSIUM BICARBONATE PO Take 1 tablet by mouth. Patient takes 2 tablets daily   . pravastatin (PRAVACHOL) 40 MG tablet Take 1 tablet (40 mg total) by mouth every evening.   [name] telmisartan (MICARDIS) 20 MG tablet Take 1 tablet (20 mg total) by mouth daily.    No facility-administered encounter medications on file as of 11/29/2019.    Current Diagnosis/Assessment:    Goals Addressed   None     Diabetes   Recent Relevant Labs: Lab Results  Component Value Date/Time   HGBA1C 8.0 (H) 11/23/2019 02:26 PM   HGBA1C 7.7 (H) 08/17/2019 12:03 PM   MICROALBUR 10 05/17/2019 12:47 PM   MICROALBUR 30 05/05/2018 01:12 PM     Checking BG: 4 times daily- FreeStyle Libre  Recent FBG Readings: 145 Recent pre-meal BG readings: 136, 142 Recent 2hr PP BG readings:  144, 148, 192 Recent HS BG readings:  Patient has failed these meds in past: Bydureon, glimepiride  Patient is currently uncontrolled on the following medications:   Ozempic 1 mg weekly  Farxiga 25m daily  Tresiba 20 units daily  Novolog Flexpen Sliding scale  Last diabetic Foot exam: 01/23/19 Last diabetic Eye exam: Lab Results  Component Value Date/Time   HMDIABEYEEXA Retinopathy (A) 06/09/2017 12:00 AM    We discussed:  BG average for last 7 days: 149  BG average for last 14 days: 135  BG average for last 30 days: 137  BG average for last 90 days: 142  Denies symptoms of hypoglycemia usually, but checks when that happens  Drinks orange juice or eats peanut butter when BG is  <70  Diet extensively  Eats a lot of fruit during the summer  Counseled on sugar content of fruits and recommended low sugar fruits (Berries)  Fresh vegetables and corn   Discussed carbohydrate content of  corn  Exercise extensively  Walks every other day for 15-20 minutes  Has an ab machine she uses sometimes  Legs cramps have led to decreased exercising  Mustard "cocktail" helps with cramps  Recommend exercise goal 30 minutes 5 times weekly  Pt is still using Lantus instead of Tresiba. She had a lot of Lantus left over from before she started [place] she is going to use it up before she goes back to [place]   She uses no more than 6 units of Novolog maybe twice daily (per sliding scale)  Discussed Ozempic, Lantus, and Novolog  What types of medications they are, how they work, and when to take them  Discussed FWilder Glade Pt is drinking a lot of water   Advised about potential side effect (genitourinary fungal infections)  Encouraged daily probiotic use  Pt has not received FIranfrom mail order yet  Contacted [facility] and determined FWilder Gladeneeds a prior authorization. Paperwork is being faxed to the office for completion. Notified PCP.  Plan Continue current medications  Assist pt with obtaining Farxiga from [facility]  Hypertension   Office blood pressures are  BP Readings from Last 3 Encounters:  11/23/19 116/80  08/17/19 138/78  05/17/19 (!) 142/80   Patient has failed these meds in the past: N/A Patient is currently uncontrolled on the following medications:   Telmisartan 43m daily  Patient checks BP at home infrequently  Patient home BP readings are ranging: None to provide  We discussed:  Pt thinks her home BP machine is not reading correctly  Advised pt to check BP if symptomatic   Pt states she was advised by PCP to take telmisartan every other day due to cramps she was experiencing (rosuvastatin also changed to pravastatin)  Plan Continue current medications  Clarify telmisartan dosing with PCP ***supposed to be once daily***  Hyperlipidemia   LDL goal < 70  Lipid Panel     Component Value Date/Time   CHOL 155  11/23/2019 1426   TRIG 77 11/23/2019 1426   HDL 56 11/23/2019 1426   LDLCALC 84 11/23/2019 1426    Hepatic Function Latest Ref Rng & Units 11/23/2019 08/17/2019 05/17/2019  Total Protein 6.0 - 8.5 g/dL 7.3 7.6 7.6  Albumin 3.8 - 4.8 g/dL 4.5 4.4 4.4  AST 0 - 40 IU/L _0 ALT 0 - 32 IU/L _1 Alk Phosphatase 44 - 121 IU/L 57 49 53  Total Bilirubin 0.0 - 1.2 mg/dL 0.2 0.4 0.4     The 10-year ASCVD risk score (Mikey BussingDC Jr., et al., 2013) is: 15%   Values used to calculate the score:     Age: 89years     Sex: Female     Is Non-Hispanic African American: Yes     Diabetic: Yes     Tobacco smoker: No     Systolic Blood Pressure: 1562mmHg     Is BP treated: Yes     HDL Cholesterol: 56 mg/dL     Total Cholesterol: 155 mg/dL   Patient has failed these meds in past: Rosuvastatin (cramps) Patient is currently uncontrolled on the following medications:  . Pravastatin 449mdaily . Omega-3 fatty acids 2 capsules daily  We discussed:    Pravastatin dose increased last month per PharmD recommendation   Pt was changed to pravastatin from rosuvastatin and reports no issues Diet and exercise extensively  Pt eats minimal red meat  Provided diet recommendations  Plan Continue current medications  Pravastatin 202ms a low intensity statin, recommend to PCP increasing to at least a moderate intensity (pravastatin 54m16mily) due to patient's diabetes and ASCVD risk.   Allergic Rhinitis/ Allergies    Patient has failed these meds in past: XyzaGriswoldrtec, AlleMariettaient is currently controlled on the following medications:   Loratadine 10mg54mly  Fluticasone 50mcg60m 1 spray in each nostril daily  Olopatadine 0.7% 1 drop in both eyes daily  We discussed:    Patient using Claritin as needed for allergies  Symptoms are controlled with current drug regimen  Plan Continue current medications   Pain   Patient has failed these meds in past: N/A Patient is currently  controlled on the following medications:   Meloxicam 15 mg once daily as needed  Hemp cream and gummies  We discussed:  Pt states that she takes meloxicam at bedtime as needed  She uses hemp cream for pain more often than hemp gummies  Plan Continue current medications  Vitamin D Deficiency  Vitamin D level: 86.3 on 05/17/19  Patient has failed these  meds in past: N/A Patient is currently controlled on the following medications:   Ergocalciferol 50,000 units once weekly  Cholecalciferol 1000 units daily  We discussed:   Pt is taking both ergocalciferol and cholecalciferol  Plan Recommend to PCP, Stop ergocalciferol ad continue cholecalciferol at a dose of 2000 units daily  Vaccines   Reviewed and discussed patient's vaccination history. No [facility] records aside from [place] History  Administered Date(s) Administered  . Fluad Quad(high Dose 65+) 11/23/2019  . Influenza, High Dose Seasonal PF 11/28/2018  . Influenza-Unspecified 11/18/2017  . PFIZER SARS-COV-2 Vaccination 04/07/2019, 05/01/2019   Plan Discuss at follow up  Medication Management   Pt uses [facility] and [place] for all medications Uses pill box? No - Keeps medications lined up in dresser drawer Pt endorses 99% compliance  We discussed:   Pt states she misses vitamin D dose once in a while  Importance of medication adherence  Plan Continue current medication management strategy  Follow up: 3 month phone visit  Jannette Fogo, PharmD Clinical Pharmacist [facility] [phone]

## 2020-02-07 ENCOUNTER — Telehealth: Payer: Self-pay

## 2020-02-07 NOTE — Chronic Care Management (AMB) (Signed)
Chronic Care Management Pharmacy Assistant   Name: Jacqueline Bell  MRN: [id number] DOB: 12/01/1950  Reason for Encounter: Disease State -Diabetes and Hypertension Adherence Call.   Patient Questions:  1.  Have you seen any other providers since your last visit? Yes 11/23/2019 Jacqueline Moore,FNP ( PCP)    2.  Any changes in your medicines or health? No     PCP : Arnette Felts, FNP  Allergies:   Allergies  Allergen Reactions  . Iodine Hives  . Shellfish Allergy Hives    Medications: Outpatient Encounter Medications as of 02/07/2020  Medication Sig Note  . cholecalciferol (VITAMIN D) 1000 units tablet Take 1,000 Units by mouth daily.   . dapagliflozin propanediol (FARXIGA) 5 MG TABS tablet Take 1 tablet (5 mg total) by mouth daily before breakfast. (Patient not taking: Reported on 09/23/2019)   . EPINEPHrine 0.3 mg/0.3 mL IJ SOAJ injection  (Patient not taking: Reported on 09/23/2019)   . fluticasone (FLONASE) 50 MCG/ACT nasal spray USE 1 SPRAY IN EACH NOSTRIL DAILY   . insulin aspart (NOVOLOG FLEXPEN) 100 UNIT/ML FlexPen Per SS: < 70 = 0; 70-90 = 4 units; 91-130 = 6 units; 131-150 = 7 units; 151-200 = 8 units; 201-250 = 9 units; 251-300 = 10 units; 301-350 = 11 units; 351-400 = 12 units; 401-450 = 13 units; > 450 = 14 units.   . insulin degludec (TRESIBA FLEXTOUCH) 100 UNIT/ML FlexTouch Pen Inject 20 Units into the skin daily.   [name] loratadine (CLARITIN) 10 MG tablet Take 1 tablet (10 mg total) by mouth daily.   . magnesium oxide (MAG-OX) 400 MG tablet Take 400 mg by mouth daily.   . meloxicam (MOBIC) 15 MG tablet Take 1 tablet (15 mg total) by mouth as needed.   . Multiple Vitamin (MULTIVITAMIN) tablet Take 1 tablet by mouth daily.   . Olopatadine HCl (PAZEO) 0.7 % SOLN Place 1 drop into both eyes daily. 05/19/2018: Patient is taking as needed.   . Omega-3 Fatty Acids (FISH OIL PO) Take 2 capsules by mouth daily.    [name] OZEMPIC, 1 MG/DOSE, 2 MG/1.5ML SOPN INJECT 1 MG UNDER THE SKIN  ONCE WEEKLY   . POTASSIUM BICARBONATE PO Take 1 tablet by mouth. Patient takes 2 tablets daily   . pravastatin (PRAVACHOL) 40 MG tablet Take 1 tablet (40 mg total) by mouth every evening.   [name] telmisartan (MICARDIS) 20 MG tablet Take 1 tablet (20 mg total) by mouth daily.    No facility-administered encounter medications on file as of 02/07/2020.    Current Diagnosis: Patient Active Problem List   Diagnosis Date Noted  . Seasonal and perennial allergic rhinitis 04/04/2018  . Allergic conjunctivitis 04/04/2018  . 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 Allergic reaction 04/04/2018  . Cramp and spasm 02/24/2018  . History of food allergy 02/24/2018  . Type 2 diabetes mellitus without complication, with long-term current use of insulin (HCC) 02/24/2018  . Insomnia 02/24/2018   Recent Relevant Labs: Lab Results  Component Value Date/Time   HGBA1C 8.0 (H) 11/23/2019 02:26 PM   HGBA1C 7.7 (H) 08/17/2019 12:03 PM   MICROALBUR 10 05/17/2019 12:47 PM   MICROALBUR 30 05/05/2018 01:12 PM    Kidney Function Lab Results  Component Value Date/Time   CREATININE 0.73 11/23/2019 02:26 PM   CREATININE 0.79 08/17/2019 12:03 PM   GFRNONAA 85 11/23/2019 02:26 PM   GFRAA 99 11/23/2019 02:26 PM    . Current antihyperglycemic regimen:   Ozempic 1 mg weekly  Farxiga 5mg  daily---  Lantus 20 units daily  Novolog Flexpen Sliding scale  . What recent interventions/DTPs have been made to improve glycemic control:  o Patient states she has been taking her medications as directed by provider  . Have there been any recent hospitalizations or ED visits since last visit with CPP? No  . Patient denies hypoglycemic symptoms, including Pale, Sweaty, Shaky, Hungry, Nervous/irritable and Vision changes . Patient denies hyperglycemic symptoms, including blurry vision, excessive thirst, fatigue, polyuria and weakness . How often are you checking your blood sugar? Patient states she checks every day, sometime  four times a day.  . What are your blood sugars ranging? Average reading for 30 days is 142 o Fasting: None o Before meals:None o After meals: None o Bedtime: None . During the week, how often does your blood glucose drop below 70?  No ( 82 ) Lowest   . Are you checking your feet daily/regularly? Yes, Denies any open sores or pain, States she sometimes as foot cramps. Adherence Review: Is the patient currently on a STATIN medication? {Yes Is the patient currently on ACE/ARB medication? Yes Does the patient have >5 day gap between last estimated fill dates? No  Reviewed chart prior to disease state call. Spoke with patient regarding BP  Recent Office Vitals: BP Readings from Last 3 Encounters:  11/23/19 116/80  08/17/19 138/78  05/17/19 (!) 142/80   Pulse Readings from Last 3 Encounters:  11/23/19 83  08/17/19 77  05/17/19 84    Wt Readings from Last 3 Encounters:  11/23/19 192 lb 9.6 oz (87.4 kg)  08/17/19 194 lb (88 kg)  05/17/19 194 lb (88 kg)     Kidney Function Lab Results  Component Value Date/Time   CREATININE 0.73 11/23/2019 02:26 PM   CREATININE 0.79 08/17/2019 12:03 PM   GFRNONAA 85 11/23/2019 02:26 PM   GFRAA 99 11/23/2019 02:26 PM    BMP Latest Ref Rng & Units 11/23/2019 08/17/2019 05/17/2019  Glucose 65 - 99 mg/dL 07/17/2019) 607(P) 710(G)  BUN 8 - 27 mg/dL 8 9 8   Creatinine 0.57 - 1.00 mg/dL 269(S 8.54  BUN/Creat Ratio 12 - 28 11(L) 11(L) 11(L)  Sodium 134 - 144 mmol/L 139 140 140  Potassium 3.5 - 5.2 mmol/L 4.3 4.1 4.4  Chloride 96 - 106 mmol/L 101 100 99  CO2 20 - 29 mmol/L 26 24 27   Calcium 8.7 - 10.3 mg/dL 9.1 9.2 9.6    . Current antihypertensive regimen:           Telmisartan 20 mg daily . How often are you checking your Blood Pressure? Goal -Check BP weekly - Patient states she checks once in a while. Does not check weekly.  . Current home BP readings: 130/72  . What recent interventions/DTPs have been made by any provider to improve Blood  Pressure control since last CPP Visit:  Patient states she is taking her medications as directed by provider.  . Any recent hospitalizations or ED visits since last visit with CPP?  No  . What diet changes have been made to improve Blood Pressure Control?              Goal -Ensure daily salt intake < 2300 mg/day o Patient states she has been watching her salt intake and eating healthy. . What exercise is being done to improve your Blood Pressure Control?               Goal -Exercise 30 minutes daily 5  days per week   Patient states that she walks every day , she does not time her walks.  Adherence Review: Is the patient currently on ACE/ARB medication? Yes Does the patient have >5 day gap between last estimated fill dates? No   Goals Addressed            This Visit's Progress   . Patient Stated   On track    05/17/2019, try to get off diabetes medication and lose 15 pounds       Follow-Up:  Pharmacist Review - Patient would like to fill out patient assistance form for her Ozempic .  New assistance form filled out to [employer] for [employer] . Patient has an appointment with Erasmo Score on 02/22/2020 will bring proof of income and will sign form at time of appointment.  Sent completed form toTamala Melvin .   Cherylin Mylar, CPP. Notified  Jon Gills, [facility] Clinical Pharmacist Assistant [phone]

## 2020-02-22 ENCOUNTER — Other Ambulatory Visit: Payer: Self-pay

## 2020-02-22 ENCOUNTER — Ambulatory Visit (INDEPENDENT_AMBULATORY_CARE_PROVIDER_SITE_OTHER): Payer: Medicare Other | Admitting: Nurse Practitioner

## 2020-02-22 ENCOUNTER — Encounter: Payer: Self-pay | Admitting: Nurse Practitioner

## 2020-02-22 VITALS — BP 134/76 | HR 76 | Temp 98.2°F | Ht 66.0 in | Wt 191.6 lb

## 2020-02-22 DIAGNOSIS — J302 Other seasonal allergic rhinitis: Secondary | ICD-10-CM

## 2020-02-22 DIAGNOSIS — Z23 Encounter for immunization: Secondary | ICD-10-CM | POA: Diagnosis not present

## 2020-02-22 DIAGNOSIS — E559 Vitamin D deficiency, unspecified: Secondary | ICD-10-CM

## 2020-02-22 DIAGNOSIS — I10 Essential (primary) hypertension: Secondary | ICD-10-CM | POA: Diagnosis not present

## 2020-02-22 DIAGNOSIS — Z794 Long term (current) use of insulin: Secondary | ICD-10-CM

## 2020-02-22 DIAGNOSIS — E119 Type 2 diabetes mellitus without complications: Secondary | ICD-10-CM | POA: Diagnosis not present

## 2020-02-22 MED ORDER — PNEUMOCOCCAL 13-VAL CONJ VACC IM SUSP: 0.5000 mL | INTRAMUSCULAR | 0 refills | Status: AC

## 2020-02-22 NOTE — Progress Notes (Signed)
Rutherford Nail as a scribe for [name], FNP.,have documented all relevant documentation on the behalf of [name], FNP,as directed by  [name], FNP while in the presence of [name], [place]. This visit occurred during the SARS-CoV-2 public health emergency.  Safety protocols were in place, including screening questions prior to the visit, additional usage of staff PPE, and extensive cleaning of exam room while observing appropriate contact time as indicated for disinfecting solutions.  Subjective:     Patient ID: Jacqueline Bell , female    DOB: 03/13/1950 , 69 y.o.   MRN: [id number]   Chief Complaint  Patient presents with  . Diabetes    HPI  Here today for her 3 month diabetes and blood pressure follow up.    She has been doing natural garlic gargle.   Continues with tresiba 20 units nightly, novolog sliding scale and weekly ozempic  She is also using a nettie pot.    Diabetes She presents for her follow-up diabetic visit. She has type 2 diabetes mellitus. Her disease course has been stable. Pertinent negatives for hypoglycemia include no confusion, dizziness or headaches. There are no diabetic associated symptoms. Pertinent negatives for diabetes include no chest pain, no fatigue, no polydipsia, no polyphagia and no polyuria. There are no hypoglycemic complications. Symptoms are stable. There are no diabetic complications. Risk factors for coronary artery disease include sedentary lifestyle and obesity. Current diabetic treatment includes oral agent (dual therapy). She is compliant with treatment all of the time. Her weight is stable. She is following a generally healthy diet. When asked about meal planning, she reported none. She has not had a previous visit with a dietitian. She rarely participates in exercise. There is no change in her home blood glucose trend. (She is now using the [place]    She has been taking antibiotics for a sinus infection so the average  has been 165 in the last 7 days.  Average the last 30 days is 148) An ACE inhibitor/angiotensin II receptor blocker is being taken. She does not see a podiatrist.Eye exam is not current (last done in 2019).  Hypertension This is a chronic problem. The current episode started more than 1 year ago. The problem is unchanged. The problem is uncontrolled. Pertinent negatives include no anxiety, chest pain, headaches or palpitations. Risk factors for coronary artery disease include obesity, sedentary lifestyle and diabetes mellitus. Past treatments include ACE inhibitors. The current treatment provides mild improvement. There are no compliance problems.  There is no history of angina or kidney disease. There is no history of chronic renal disease.     Past Medical History:  Diagnosis Date  . Diabetes mellitus without complication ([place])      History reviewed. No pertinent family history.   Current Outpatient Medications:  .  cholecalciferol (VITAMIN D) 1000 units tablet, Take 1,000 Units by mouth daily., Disp: , Rfl:  .  dapagliflozin propanediol (FARXIGA) 5 MG TABS tablet, Take 1 tablet (5 mg total) by mouth daily before breakfast., Disp: 90 tablet, Rfl: 1 .  EPINEPHrine 0.3 mg/0.3 mL IJ SOAJ injection, , Disp: , Rfl:  .  fluticasone (FLONASE) 50 MCG/ACT nasal spray, USE 1 SPRAY IN EACH NOSTRIL DAILY, Disp: 16 g, Rfl: 0 .  insulin aspart (NOVOLOG FLEXPEN) 100 UNIT/ML FlexPen, Per SS: < 70 = 0; 70-90 = 4 units; 91-130 = 6 units; 131-150 = 7 units; 151-200 = 8 units; 201-250 = 9 units; 251-300 = 10 units; 301-350 = 11 units;  351-400 = 12 units; 401-450 = 13 units; > 450 = 14 units., Disp: 15 mL, Rfl: 11 .  insulin degludec (TRESIBA FLEXTOUCH) 100 UNIT/ML FlexTouch Pen, Inject 20 Units into the skin daily., Disp: 9 mL, Rfl: 1 .  loratadine (CLARITIN) 10 MG tablet, Take 1 tablet (10 mg total) by mouth daily., Disp: 90 tablet, Rfl: 1 .  magnesium oxide (MAG-OX) 400 MG tablet, Take 400 mg by mouth daily.,  Disp: , Rfl:  .  meloxicam (MOBIC) 15 MG tablet, Take 1 tablet (15 mg total) by mouth as needed., Disp: 90 tablet, Rfl: 1 .  Multiple Vitamin (MULTIVITAMIN) tablet, Take 1 tablet by mouth daily., Disp: , Rfl:  .  Olopatadine HCl (PAZEO) 0.7 % SOLN, Place 1 drop into both eyes daily., Disp: 1 Bottle, Rfl: 5 .  Omega-3 Fatty Acids (FISH OIL PO), Take 2 capsules by mouth daily. , Disp: , Rfl:  .  OZEMPIC, 1 MG/DOSE, 2 MG/1.5ML SOPN, INJECT 1 MG UNDER THE SKIN ONCE WEEKLY, Disp: 9 mL, Rfl: 3 .  POTASSIUM BICARBONATE PO, Take 1 tablet by mouth. Patient takes 2 tablets daily, Disp: , Rfl:  .  pravastatin (PRAVACHOL) 40 MG tablet, Take 1 tablet (40 mg total) by mouth every evening., Disp: 90 tablet, Rfl: 1 .  telmisartan (MICARDIS) 20 MG tablet, Take 1 tablet (20 mg total) by mouth daily., Disp: 90 tablet, Rfl: 1   Allergies  Allergen Reactions  . Iodine Hives  . Shellfish Allergy Hives     Review of Systems  Constitutional: Negative.  Negative for fatigue.  HENT: Positive for congestion (nasal congestion).   Respiratory: Negative.  Negative for cough.   Cardiovascular: Negative for chest pain, palpitations and leg swelling.  Endocrine: Negative for polydipsia, polyphagia and polyuria.  Musculoskeletal: Negative.   Skin: Negative.   Neurological: Negative for dizziness and headaches.  Psychiatric/Behavioral: Negative.  Negative for confusion.     Today's Vitals   02/22/20 1142  BP: 134/76  Pulse: 76  Temp: 98.2 F (36.8 C)  TempSrc: Oral  Weight: 191 lb 9.6 oz (86.9 kg)  Height: '5\' 6"'  (1.676 m)   Body mass index is 30.93 kg/m.  Wt Readings from Last 3 Encounters:  02/22/20 191 lb 9.6 oz (86.9 kg)  11/23/19 192 lb 9.6 oz (87.4 kg)  08/17/19 194 lb (88 kg)   Objective:  Physical Exam Vitals reviewed.  Constitutional:      General: She is not in acute distress.    Appearance: Normal appearance. She is obese.  HENT:     Nose: Congestion present.  Cardiovascular:     Rate  and Rhythm: Normal rate and regular rhythm.     Pulses: Normal pulses.     Heart sounds: Normal heart sounds. No murmur heard.   Pulmonary:     Effort: Pulmonary effort is normal. No respiratory distress.     Breath sounds: Normal breath sounds. No wheezing.  Skin:    General: Skin is warm and dry.     Capillary Refill: Capillary refill takes less than 2 seconds.  Neurological:     General: No focal deficit present.     Mental Status: She is alert and oriented to person, place, and time.     Cranial Nerves: No cranial nerve deficit.     Motor: No weakness.  Psychiatric:        Mood and Affect: Mood normal.        Behavior: Behavior normal.  Thought Content: Thought content normal.        Judgment: Judgment normal.         Assessment And Plan:     1. Type 2 diabetes mellitus without complication, with long-term current use of insulin (HCC)  Chronic, controlled  Continue with current medications  Encouraged to limit intake of sugary foods and drinks  Encouraged to increase physical activity to 150 minutes per week  Diabetic foot exam done, no abnormal findings  Eye exam is not up to date, she is to call Dr. Katy Fitch for an appt - Hemoglobin A1c  2. Essential hypertension . B/P is controlled.  . CMP ordered to check renal function.  . The importance of regular exercise and dietary modification was stressed to the patient.  . Stressed importance of losing ten percent of her body weight to help with B/P control.  . The weight loss would help with decreasing cardiac and cancer risk as well.  - CMP14+EGFR - Hemoglobin A1c  3. Vitamin D deficiency  Will check vitamin D level and supplement as needed.     Also encouraged to spend 15 minutes in the sun daily.   5. Seasonal allergies norel ad as needed no more than 2 times a day, call for Rx.  Worse at certain times of the day, she has been fully vaccinated    Patient was given opportunity to ask questions.  Patient verbalized understanding of the plan and was able to repeat key elements of the plan. All questions were answered to their satisfaction.   Teola Bradley, FNP, have reviewed all documentation for this visit. The documentation on 02/22/20 for the exam, diagnosis, procedures, and orders are all accurate and complete.  THE PATIENT IS ENCOURAGED TO PRACTICE SOCIAL DISTANCING DUE TO THE COVID-19 PANDEMIC.

## 2020-02-22 NOTE — Patient Instructions (Signed)
Diabetes Mellitus and Exercise Exercising regularly is important for your overall health, especially when you have diabetes (diabetes mellitus). Exercising is not only about losing weight. It has many other health benefits, such as increasing muscle strength and bone density and reducing body fat and stress. This leads to improved fitness, flexibility, and endurance, all of which result in better overall health. Exercise has additional benefits for people with diabetes, including:  Reducing appetite.  Helping to lower and control blood glucose.  Lowering blood pressure.  Helping to control amounts of fatty substances (lipids) in the blood, such as cholesterol and triglycerides.  Helping the body to respond better to insulin (improving insulin sensitivity).  Reducing how much insulin the body needs.  Decreasing the risk for heart disease by: ? Lowering cholesterol and triglyceride levels. ? Increasing the levels of good cholesterol. ? Lowering blood glucose levels. What is my activity plan? Your health care provider or certified diabetes educator can help you make a plan for the type and frequency of exercise (activity plan) that works for you. Make sure that you:  Do at least 150 minutes of moderate-intensity or vigorous-intensity exercise each week. This could be brisk walking, biking, or water aerobics. ? Do stretching and strength exercises, such as yoga or weightlifting, at least 2 times a week. ? Spread out your activity over at least 3 days of the week.  Get some form of physical activity every day. ? Do not go more than 2 days in a row without some kind of physical activity. ? Avoid being inactive for more than 30 minutes at a time. Take frequent breaks to walk or stretch.  Choose a type of exercise or activity that you enjoy, and set realistic goals.  Start slowly, and gradually increase the intensity of your exercise over time. What do I need to know about managing my  diabetes?   Check your blood glucose before and after exercising. ? If your blood glucose is 240 mg/dL (13.3 mmol/L) or higher before you exercise, check your urine for ketones. If you have ketones in your urine, do not exercise until your blood glucose returns to normal. ? If your blood glucose is 100 mg/dL (5.6 mmol/L) or lower, eat a snack containing 15-20 grams of carbohydrate. Check your blood glucose 15 minutes after the snack to make sure that your level is above 100 mg/dL (5.6 mmol/L) before you start your exercise.  Know the symptoms of low blood glucose (hypoglycemia) and how to treat it. Your risk for hypoglycemia increases during and after exercise. Common symptoms of hypoglycemia can include: ? Hunger. ? Anxiety. ? Sweating and feeling clammy. ? Confusion. ? Dizziness or feeling light-headed. ? Increased heart rate or palpitations. ? Blurry vision. ? Tingling or numbness around the mouth, lips, or tongue. ? Tremors or shakes. ? Irritability.  Keep a rapid-acting carbohydrate snack available before, during, and after exercise to help prevent or treat hypoglycemia.  Avoid injecting insulin into areas of the body that are going to be exercised. For example, avoid injecting insulin into: ? The arms, when playing tennis. ? The legs, when jogging.  Keep records of your exercise habits. Doing this can help you and your health care provider adjust your diabetes management plan as needed. Write down: ? Food that you eat before and after you exercise. ? Blood glucose levels before and after you exercise. ? The type and amount of exercise you have done. ? When your insulin is expected to peak, if you use   insulin. Avoid exercising at times when your insulin is peaking.  When you start a new exercise or activity, work with your health care provider to make sure the activity is safe for you, and to adjust your insulin, medicines, or food intake as needed.  Drink plenty of water while  you exercise to prevent dehydration or heat stroke. Drink enough fluid to keep your urine clear or pale yellow. Summary  Exercising regularly is important for your overall health, especially when you have diabetes (diabetes mellitus).  Exercising has many health benefits, such as increasing muscle strength and bone density and reducing body fat and stress.  Your health care provider or certified diabetes educator can help you make a plan for the type and frequency of exercise (activity plan) that works for you.  When you start a new exercise or activity, work with your health care provider to make sure the activity is safe for you, and to adjust your insulin, medicines, or food intake as needed. This information is not intended to replace advice given to you by your health care provider. Make sure you discuss any questions you have with your health care provider. Document Revised: 09/17/2016 Document Reviewed: 08/05/2015 Elsevier Patient Education  2020 Elsevier Inc.  

## 2020-02-23 LAB — CMP14+EGFR
ALT: 16 IU/L (ref 0–32)
AST: 24 IU/L (ref 0–40)
Albumin/Globulin Ratio: 1.4 (ref 1.2–2.2)
Albumin: 4.2 g/dL (ref 3.8–4.8)
Alkaline Phosphatase: 47 IU/L (ref 44–121)
BUN/Creatinine Ratio: 12 (ref 12–28)
BUN: 9 mg/dL (ref 8–27)
Bilirubin Total: 0.2 mg/dL (ref 0.0–1.2)
CO2: 26 mmol/L (ref 20–29)
Calcium: 8.8 mg/dL (ref 8.7–10.3)
Chloride: 102 mmol/L (ref 96–106)
Creatinine, Ser: 0.74 mg/dL (ref 0.57–1.00)
GFR calc Af Amer: 96 mL/min/{1.73_m2} (ref >59)
GFR calc non Af Amer: 84 mL/min/{1.73_m2} (ref >59)
Globulin, Total: 3 g/dL (ref 1.5–4.5)
Glucose: 124 mg/dL — ABNORMAL HIGH (ref 65–99)
Potassium: 4.5 mmol/L (ref 3.5–5.2)
Sodium: 140 mmol/L (ref 134–144)
Total Protein: 7.2 g/dL (ref 6.0–8.5)

## 2020-02-23 LAB — HEMOGLOBIN A1C
Est. average glucose Bld gHb Est-mCnc: 171 mg/dL
Hgb A1c MFr Bld: 7.6 % — ABNORMAL HIGH (ref 4.8–5.6)

## 2020-02-26 ENCOUNTER — Telehealth: Payer: Medicare Other

## 2020-03-06 ENCOUNTER — Telehealth: Payer: Self-pay

## 2020-03-06 NOTE — Chronic Care Management (AMB) (Signed)
° ° °  Chronic Care Management Pharmacy Assistant   Name: Jacqueline Bell  MRN: [id number] DOB: 12-27-1950  Reason for Encounter: Medication Review   PCP : Arnette Felts, FNP  Allergies:   Allergies  Allergen Reactions   Iodine Hives   Shellfish Allergy Hives    Medications: Outpatient Encounter Medications as of 03/06/2020  Medication Sig Note   cholecalciferol (VITAMIN D) 1000 units tablet Take 1,000 Units by mouth daily.    dapagliflozin propanediol (FARXIGA) 5 MG TABS tablet Take 1 tablet (5 mg total) by mouth daily before breakfast.    EPINEPHrine 0.3 mg/0.3 mL IJ SOAJ injection     fluticasone (FLONASE) 50 MCG/ACT nasal spray USE 1 SPRAY IN EACH NOSTRIL DAILY    insulin aspart (NOVOLOG FLEXPEN) 100 UNIT/ML FlexPen Per SS: < 70 = 0; 70-90 = 4 units; 91-130 = 6 units; 131-150 = 7 units; 151-200 = 8 units; 201-250 = 9 units; 251-300 = 10 units; 301-350 = 11 units; 351-400 = 12 units; 401-450 = 13 units; > 450 = 14 units.    insulin degludec (TRESIBA FLEXTOUCH) 100 UNIT/ML FlexTouch Pen Inject 20 Units into the skin daily.    loratadine (CLARITIN) 10 MG tablet Take 1 tablet (10 mg total) by mouth daily.    magnesium oxide (MAG-OX) 400 MG tablet Take 400 mg by mouth daily.    meloxicam (MOBIC) 15 MG tablet Take 1 tablet (15 mg total) by mouth as needed.    Multiple Vitamin (MULTIVITAMIN) tablet Take 1 tablet by mouth daily.    Olopatadine HCl (PAZEO) 0.7 % SOLN Place 1 drop into both eyes daily. 05/19/2018: Patient is taking as needed.    Omega-3 Fatty Acids (FISH OIL PO) Take 2 capsules by mouth daily.     OZEMPIC, 1 MG/DOSE, 2 MG/1.5ML SOPN INJECT 1 MG UNDER THE SKIN ONCE WEEKLY    POTASSIUM BICARBONATE PO Take 1 tablet by mouth. Patient takes 2 tablets daily    pravastatin (PRAVACHOL) 40 MG tablet Take 1 tablet (40 mg total) by mouth every evening.    telmisartan (MICARDIS) 20 MG tablet Take 1 tablet (20 mg total) by mouth daily.    No facility-administered  encounter medications on file as of 03/06/2020.    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 Per [employer] data total gaps - all measures (0) - total gaps - all measures (0).  [name],CPP notified   Jon Gills, [facility] Clinical Pharmacist Assistant [phone]

## 2020-03-12 ENCOUNTER — Telehealth: Payer: Self-pay

## 2020-03-12 NOTE — Chronic Care Management (AMB) (Signed)
° ° °  Chronic Care Management Pharmacy Assistant   Name: ARNETA MAHMOOD  MRN: [id number] DOB: July 04, 1950  Reason for Encounter: Patient Assistance Coordination for Ozempic   PCP : Arnette Felts, FNP  Allergies:   Allergies  Allergen Reactions   Iodine Hives   Shellfish Allergy Hives    Medications: Outpatient Encounter Medications as of 03/12/2020  Medication Sig Note   cholecalciferol (VITAMIN D) 1000 units tablet Take 1,000 Units by mouth daily.    dapagliflozin propanediol (FARXIGA) 5 MG TABS tablet Take 1 tablet (5 mg total) by mouth daily before breakfast.    EPINEPHrine 0.3 mg/0.3 mL IJ SOAJ injection     fluticasone (FLONASE) 50 MCG/ACT nasal spray USE 1 SPRAY IN EACH NOSTRIL DAILY    insulin aspart (NOVOLOG FLEXPEN) 100 UNIT/ML FlexPen Per SS: < 70 = 0; 70-90 = 4 units; 91-130 = 6 units; 131-150 = 7 units; 151-200 = 8 units; 201-250 = 9 units; 251-300 = 10 units; 301-350 = 11 units; 351-400 = 12 units; 401-450 = 13 units; > 450 = 14 units.    insulin degludec (TRESIBA FLEXTOUCH) 100 UNIT/ML FlexTouch Pen Inject 20 Units into the skin daily.    loratadine (CLARITIN) 10 MG tablet Take 1 tablet (10 mg total) by mouth daily.    magnesium oxide (MAG-OX) 400 MG tablet Take 400 mg by mouth daily.    meloxicam (MOBIC) 15 MG tablet Take 1 tablet (15 mg total) by mouth as needed.    Multiple Vitamin (MULTIVITAMIN) tablet Take 1 tablet by mouth daily.    Olopatadine HCl (PAZEO) 0.7 % SOLN Place 1 drop into both eyes daily. 05/19/2018: Patient is taking as needed.    Omega-3 Fatty Acids (FISH OIL PO) Take 2 capsules by mouth daily.     OZEMPIC, 1 MG/DOSE, 2 MG/1.5ML SOPN INJECT 1 MG UNDER THE SKIN ONCE WEEKLY    POTASSIUM BICARBONATE PO Take 1 tablet by mouth. Patient takes 2 tablets daily    pravastatin (PRAVACHOL) 40 MG tablet Take 1 tablet (40 mg total) by mouth every evening.    telmisartan (MICARDIS) 20 MG tablet Take 1 tablet (20 mg total) by mouth daily.    No  facility-administered encounter medications on file as of 03/12/2020.    Current Diagnosis: Patient Active Problem List   Diagnosis Date Noted   Seasonal and perennial allergic rhinitis 04/04/2018   Allergic conjunctivitis 04/04/2018   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   Allergic reaction 04/04/2018   Cramp and spasm 02/24/2018   History of food allergy 02/24/2018   Type 2 diabetes mellitus without complication, with long-term current use of insulin (HCC) 02/24/2018   Insomnia 02/24/2018   03/14/20-Third unsuccessful attempt to outreach the patient regarding her patient assistance application. Left HIPAA compliant voicemail for a call back at her earliest convenience.   03/13/20-Second Attempt to outreach the patient to see if the patient is able to come into the office to provide proof of income and sign [employer] application. No answer; left a voicemail for a call back.  03/12/20-Attempted to outreach the patient to see if she can come into PCP office to provide proof of income and sign [employer]o Nordisk patient assistance application for her Ozempic . No answer; left a HIPAA compliant voicemail for a call back.  Follow-Up:  Patient Assistance Coordination-Will continue to follow up with patient.   Notified Cherylin Mylar, CPP.   Suezanne Cheshire, [facility] Clinical Pharmacist Assistant [phone]

## 2020-04-03 ENCOUNTER — Telehealth: Payer: Medicare Other

## 2020-04-05 ENCOUNTER — Telehealth: Payer: Self-pay

## 2020-04-05 NOTE — Chronic Care Management (AMB) (Signed)
° ° °  Chronic Care Management Pharmacy Assistant   Name: Jacqueline Bell  MRN: [id number] DOB: 27-Dec-1950  Reason for Encounter: Medication Review   PCP : [name], FNP  Allergies:   Allergies  Allergen Reactions   Iodine Hives   Shellfish Allergy Hives    Medications: Outpatient Encounter Medications as of 04/05/2020  Medication Sig Note   cholecalciferol (VITAMIN D) 1000 units tablet Take 1,000 Units by mouth daily.    dapagliflozin propanediol (FARXIGA) 5 MG TABS tablet Take 1 tablet (5 mg total) by mouth daily before breakfast.    EPINEPHrine 0.3 mg/0.3 mL IJ SOAJ injection     fluticasone (FLONASE) 50 MCG/ACT nasal spray USE 1 SPRAY IN EACH NOSTRIL DAILY    insulin aspart (NOVOLOG FLEXPEN) 100 UNIT/ML FlexPen Per SS: < 70 = 0; 70-90 = 4 units; 91-130 = 6 units; 131-150 = 7 units; 151-200 = 8 units; 201-250 = 9 units; 251-300 = 10 units; 301-350 = 11 units; 351-400 = 12 units; 401-450 = 13 units; > 450 = 14 units.    insulin degludec (TRESIBA FLEXTOUCH) 100 UNIT/ML FlexTouch Pen Inject 20 Units into the skin daily.    loratadine (CLARITIN) 10 MG tablet Take 1 tablet (10 mg total) by mouth daily.    magnesium oxide (MAG-OX) 400 MG tablet Take 400 mg by mouth daily.    meloxicam (MOBIC) 15 MG tablet Take 1 tablet (15 mg total) by mouth as needed.    Multiple Vitamin (MULTIVITAMIN) tablet Take 1 tablet by mouth daily.    Olopatadine HCl (PAZEO) 0.7 % SOLN Place 1 drop into both eyes daily. 05/19/2018: Patient is taking as needed.    Omega-3 Fatty Acids (FISH OIL PO) Take 2 capsules by mouth daily.     OZEMPIC, 1 MG/DOSE, 2 MG/1.5ML SOPN INJECT 1 MG UNDER THE SKIN ONCE WEEKLY    POTASSIUM BICARBONATE PO Take 1 tablet by mouth. Patient takes 2 tablets daily    pravastatin (PRAVACHOL) 40 MG tablet Take 1 tablet (40 mg total) by mouth every evening.    telmisartan (MICARDIS) 20 MG tablet Take 1 tablet (20 mg total) by mouth daily.    No facility-administered  encounter medications on file as of 04/05/2020.    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 Per insurance data Total Gaps-Star Measures are (0). Total Gaps-All Measures are (0).  Wellness Bundle - met - performed Annual Wellness - met - performed Tobacco - met - non user Breast Cancer Screening - met - performed normal result Depression - met - negative depression screening.  [name], CPP Notified.   Raynelle Highland, [place] Pharmacist Assistant [phone]

## 2020-04-26 ENCOUNTER — Telehealth: Payer: Self-pay

## 2020-04-26 NOTE — Chronic Care Management (AMB) (Signed)
    Chronic Care Management Pharmacy Assistant   Name: Jacqueline Bell  MRN: [id number] DOB: March 19, 1950  Reason for Encounter: Patient Assistance Coordination   PCP : Arnette Felts, FNP  Allergies:   Allergies  Allergen Reactions  . Iodine Hives  . Shellfish Allergy Hives    Medications: Outpatient Encounter Medications as of 04/26/2020  Medication Sig Note  . cholecalciferol (VITAMIN D) 1000 units tablet Take 1,000 Units by mouth daily.   . dapagliflozin propanediol (FARXIGA) 5 MG TABS tablet Take 1 tablet (5 mg total) by mouth daily before breakfast.   . EPINEPHrine 0.3 mg/0.3 mL IJ SOAJ injection    . fluticasone (FLONASE) 50 MCG/ACT nasal spray USE 1 SPRAY IN EACH NOSTRIL DAILY   . insulin aspart (NOVOLOG FLEXPEN) 100 UNIT/ML FlexPen Per SS: < 70 = 0; 70-90 = 4 units; 91-130 = 6 units; 131-150 = 7 units; 151-200 = 8 units; 201-250 = 9 units; 251-300 = 10 units; 301-350 = 11 units; 351-400 = 12 units; 401-450 = 13 units; > 450 = 14 units.   . insulin degludec (TRESIBA FLEXTOUCH) 100 UNIT/ML FlexTouch Pen Inject 20 Units into the skin daily.   [name] loratadine (CLARITIN) 10 MG tablet Take 1 tablet (10 mg total) by mouth daily.   . magnesium oxide (MAG-OX) 400 MG tablet Take 400 mg by mouth daily.   . meloxicam (MOBIC) 15 MG tablet Take 1 tablet (15 mg total) by mouth as needed.   . Multiple Vitamin (MULTIVITAMIN) tablet Take 1 tablet by mouth daily.   . Olopatadine HCl (PAZEO) 0.7 % SOLN Place 1 drop into both eyes daily. 05/19/2018: Patient is taking as needed.   . Omega-3 Fatty Acids (FISH OIL PO) Take 2 capsules by mouth daily.    [name] OZEMPIC, 1 MG/DOSE, 2 MG/1.5ML SOPN INJECT 1 MG UNDER THE SKIN ONCE WEEKLY   . POTASSIUM BICARBONATE PO Take 1 tablet by mouth. Patient takes 2 tablets daily   . pravastatin (PRAVACHOL) 40 MG tablet Take 1 tablet (40 mg total) by mouth every evening.   [name] telmisartan (MICARDIS) 20 MG tablet Take 1 tablet (20 mg total) by mouth daily.    No  facility-administered encounter medications on file as of 04/26/2020.    Current Diagnosis: Patient Active Problem List   Diagnosis Date Noted  . Seasonal and perennial allergic rhinitis 04/04/2018  . Allergic conjunctivitis 04/04/2018  . 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 Allergic reaction 04/04/2018  . Cramp and spasm 02/24/2018  . History of food allergy 02/24/2018  . Type 2 diabetes mellitus without complication, with long-term current use of insulin (HCC) 02/24/2018  . Insomnia 02/24/2018    04/26/20-Attempted to outreach the patient regarding patient assistance application. No answer; left a voicemail with a call back number.  04/29/20-Second attempt to outreach the patient. Left a voicemail with a call back number.   05/01/20-Third attempt to the patient to discuss her [employer] application. No answer; left a voicemail for a call back.  Follow-Up:  Patient Assistance Coordination and Pharmacist Review   Cherylin Mylar, CPP Notified.  Suezanne Cheshire, CMA Clinical Pharmacist Assistant [phone] CCM Total Time: 4 minutes

## 2020-05-09 ENCOUNTER — Telehealth: Payer: Self-pay

## 2020-05-09 ENCOUNTER — Telehealth: Payer: Medicare Other

## 2020-05-09 NOTE — Telephone Encounter (Signed)
  Chronic Care Management   Outreach Note  05/09/2020 Name: Jacqueline Bell MRN: [id number] DOB: 1950-11-07  Referred by: Arnette Felts, FNP Reason for referral : Chronic Care Management (RN CM FU Call Attempt)   An unsuccessful telephone outreach was attempted today. The patient was referred to the case management team for assistance with care management and care coordination.   Follow Up Plan: A HIPAA compliant phone message was left for the patient providing contact information and requesting a return call. Telephone follow up appointment with care management team member scheduled for: 06/06/20  Delsa Sale, RN, BSN, CCM Care Management Coordinator [facility] Care Management/[facility]  Direct Phone: [phone]

## 2020-05-22 ENCOUNTER — Telehealth: Payer: Self-pay

## 2020-05-22 ENCOUNTER — Ambulatory Visit: Payer: Medicare Other | Admitting: Nurse Practitioner

## 2020-05-22 NOTE — Telephone Encounter (Signed)
This nurse attempted to call patient in regards to missed appointment. Message left to see if she wanted to reschedule for tomorrow as virtual visit. Asked her to call back and schedule if that works for her

## 2020-05-23 ENCOUNTER — Other Ambulatory Visit: Payer: Self-pay

## 2020-05-23 ENCOUNTER — Ambulatory Visit (INDEPENDENT_AMBULATORY_CARE_PROVIDER_SITE_OTHER): Payer: Medicare Other | Admitting: Nurse Practitioner

## 2020-05-23 ENCOUNTER — Encounter: Payer: Self-pay | Admitting: Nurse Practitioner

## 2020-05-23 VITALS — BP 138/80 | HR 81 | Temp 98.6°F | Ht 65.2 in | Wt 191.6 lb

## 2020-05-23 DIAGNOSIS — E559 Vitamin D deficiency, unspecified: Secondary | ICD-10-CM

## 2020-05-23 DIAGNOSIS — Z23 Encounter for immunization: Secondary | ICD-10-CM

## 2020-05-23 DIAGNOSIS — M25561 Pain in right knee: Secondary | ICD-10-CM | POA: Diagnosis not present

## 2020-05-23 DIAGNOSIS — I10 Essential (primary) hypertension: Secondary | ICD-10-CM | POA: Diagnosis not present

## 2020-05-23 DIAGNOSIS — E119 Type 2 diabetes mellitus without complications: Secondary | ICD-10-CM | POA: Diagnosis not present

## 2020-05-23 DIAGNOSIS — Z794 Long term (current) use of insulin: Secondary | ICD-10-CM | POA: Diagnosis not present

## 2020-05-23 MED ORDER — PNEUMOCOCCAL 13-VAL CONJ VACC IM SUSP: 0.5000 mL | INTRAMUSCULAR | 0 refills | Status: AC

## 2020-05-23 MED ORDER — KETOROLAC TROMETHAMINE 60 MG/2ML IM SOLN: 60.0000 mg | Freq: Once | INTRAMUSCULAR | Status: AC

## 2020-05-23 MED ORDER — PNEUMOCOCCAL 20-VAL CONJ VACC 0.5 ML IM SUSY
0.5000 mL | PREFILLED_SYRINGE | Freq: Once | INTRAMUSCULAR | 0 refills | Status: AC
Start: 2020-05-23 — End: 2020-05-23

## 2020-05-23 NOTE — Progress Notes (Signed)
pne 

## 2020-05-23 NOTE — Progress Notes (Signed)
I,Yamilka Roman [employer] as a Education administrator for [employer], FNP.,have documented all relevant documentation on the behalf of [name], FNP,as directed by  [name], FNP while in the presence of [name], [place]. This visit occurred during the SARS-CoV-2 public health emergency.  Safety protocols were in place, including screening questions prior to the visit, additional usage of staff PPE, and extensive cleaning of exam room while observing appropriate contact time as indicated for disinfecting solutions.  Subjective:     Patient ID: Jacqueline Bell , female    DOB: Apr 24, 1950 , 70 y.o.   MRN: [id number]   Chief Complaint  Patient presents with  . Hypertension  . Diabetes    HPI  Here today for her 3 month diabetes and blood pressure follow up.    Hypertension This is a chronic problem. The current episode started more than 1 year ago. The problem is unchanged. The problem is uncontrolled. Pertinent negatives include no anxiety, chest pain, headaches or palpitations. Risk factors for coronary artery disease include obesity, sedentary lifestyle and diabetes mellitus. Past treatments include ACE inhibitors. The current treatment provides mild improvement. There are no compliance problems.  There is no history of angina or kidney disease. There is no history of chronic renal disease.  Diabetes She presents for her follow-up diabetic visit. She has type 2 diabetes mellitus. Her disease course has been stable. Pertinent negatives for hypoglycemia include no confusion, dizziness or headaches. There are no diabetic associated symptoms. Pertinent negatives for diabetes include no chest pain, no fatigue, no polydipsia, no polyphagia, no polyuria and no weakness. There are no hypoglycemic complications. Symptoms are stable. There are no diabetic complications. Risk factors for coronary artery disease include sedentary lifestyle and obesity. Current diabetic treatment includes oral agent (dual  therapy). She is compliant with treatment all of the time. Her weight is stable. She is following a generally healthy diet. When asked about meal planning, she reported none. She has not had a previous visit with a dietitian. She rarely participates in exercise. There is no change in her home blood glucose trend. (She is now using the Libre    92-142 fasting, no more than 250.    Average over the last 90 days 148.) An ACE inhibitor/angiotensin II receptor blocker is being taken. She does not see a podiatrist.Eye exam is not current (last done in 2019).  Knee Pain  The incident occurred more than 1 week ago. There was no injury mechanism. The pain is present in the right knee. The quality of the pain is described as aching. Nothing aggravates the symptoms.     Past Medical History:  Diagnosis Date  . Diabetes mellitus without complication ([place])      No family history on file.   Current Outpatient Medications:  .  cholecalciferol (VITAMIN D) 1000 units tablet, Take 1,000 Units by mouth daily., Disp: , Rfl:  .  dapagliflozin propanediol (FARXIGA) 5 MG TABS tablet, Take 1 tablet (5 mg total) by mouth daily before breakfast., Disp: 90 tablet, Rfl: 1 .  EPINEPHrine 0.3 mg/0.3 mL IJ SOAJ injection, , Disp: , Rfl:  .  fluticasone (FLONASE) 50 MCG/ACT nasal spray, USE 1 SPRAY IN EACH NOSTRIL DAILY, Disp: 16 g, Rfl: 0 .  insulin aspart (NOVOLOG FLEXPEN) 100 UNIT/ML FlexPen, Per SS: < 70 = 0; 70-90 = 4 units; 91-130 = 6 units; 131-150 = 7 units; 151-200 = 8 units; 201-250 = 9 units; 251-300 = 10 units; 301-350 = 11 units; 351-400 =  12 units; 401-450 = 13 units; > 450 = 14 units., Disp: 15 mL, Rfl: 11 .  insulin degludec (TRESIBA FLEXTOUCH) 100 UNIT/ML FlexTouch Pen, Inject 20 Units into the skin daily., Disp: 9 mL, Rfl: 1 .  loratadine (CLARITIN) 10 MG tablet, Take 1 tablet (10 mg total) by mouth daily., Disp: 90 tablet, Rfl: 1 .  magnesium oxide (MAG-OX) 400 MG tablet, Take 400 mg by mouth daily.,  Disp: , Rfl:  .  meloxicam (MOBIC) 15 MG tablet, Take 1 tablet (15 mg total) by mouth as needed., Disp: 90 tablet, Rfl: 1 .  Multiple Vitamin (MULTIVITAMIN) tablet, Take 1 tablet by mouth daily., Disp: , Rfl:  .  Olopatadine HCl (PAZEO) 0.7 % SOLN, Place 1 drop into both eyes daily., Disp: 1 Bottle, Rfl: 5 .  Omega-3 Fatty Acids (FISH OIL PO), Take 2 capsules by mouth daily. , Disp: , Rfl:  .  OZEMPIC, 1 MG/DOSE, 2 MG/1.5ML SOPN, INJECT 1 MG UNDER THE SKIN ONCE WEEKLY, Disp: 9 mL, Rfl: 3 .  POTASSIUM BICARBONATE PO, Take 1 tablet by mouth. Patient takes 2 tablets daily, Disp: , Rfl:  .  pravastatin (PRAVACHOL) 40 MG tablet, Take 1 tablet (40 mg total) by mouth every evening., Disp: 90 tablet, Rfl: 1 .  telmisartan (MICARDIS) 20 MG tablet, Take 1 tablet (20 mg total) by mouth daily., Disp: 90 tablet, Rfl: 1   Allergies  Allergen Reactions  . Iodine Hives  . Shellfish Allergy Hives     Review of Systems  Constitutional: Negative for fatigue.  Respiratory: Negative.   Cardiovascular: Negative for chest pain, palpitations and leg swelling.  Endocrine: Negative for polydipsia, polyphagia and polyuria.  Neurological: Negative for dizziness, weakness and headaches.  Psychiatric/Behavioral: Negative for confusion.     Today's Vitals   05/23/20 1144  BP: 138/80  Pulse: 81  Temp: 98.6 F (37 C)  TempSrc: Oral  Weight: 191 lb 9.6 oz (86.9 kg)  Height: 5' 5.2" (1.656 m)  PainSc: 7   PainLoc: Leg   Body mass index is 31.69 kg/m.   Objective:  Physical Exam Constitutional:      General: She is not in acute distress.    Appearance: Normal appearance. She is obese.  Cardiovascular:     Rate and Rhythm: Normal rate and regular rhythm.     Pulses: Normal pulses.     Heart sounds: Normal heart sounds. No murmur heard.   Pulmonary:     Effort: Pulmonary effort is normal. No respiratory distress.     Breath sounds: Normal breath sounds. No wheezing.  Musculoskeletal:        General:  Tenderness (right anterior knee) present. No swelling. Normal range of motion.     Cervical back: Normal range of motion and neck supple.     Right lower leg: No edema.     Left lower leg: No edema.     Comments: She is limping slightly when walking  Skin:    General: Skin is warm and dry.     Capillary Refill: Capillary refill takes less than 2 seconds.  Neurological:     General: No focal deficit present.     Mental Status: She is alert and oriented to person, place, and time.     Cranial Nerves: No cranial nerve deficit.  Psychiatric:        Mood and Affect: Mood normal.        Behavior: Behavior normal.        Thought Content: Thought  content normal.        Judgment: Judgment normal.         Assessment And Plan:     1. Essential hypertension  Chronic, fair control  Continue with current medications - CMP14+EGFR  2. Type 2 diabetes mellitus without complication, with long-term current use of insulin (HCC)  Chronic, her HgbA1c has not improved much but she has not had diabetic education in quite some time and could use a refresher  She has an upcoming appt for her eye exam - Ambulatory referral to diabetic education - Hemoglobin A1c - CMP14+EGFR - Lipid panel  3. Vitamin D deficiency  Will check vitamin D level and supplement as needed.     Also encouraged to spend 15 minutes in the sun daily.   4. Acute pain of right knee  She has pain with range of motion, will treat with toradol - ketorolac (TORADOL) injection 60 mg  5. Encounter for immunization  Rx sent to pharmacy - pneumococcal 13-valent conjugate vaccine (PREVNAR 13) SUSP injection; Inject 0.5 mLs into the muscle tomorrow at 10 am for 1 dose.  Dispense: 0.5 mL; Refill: 0     Patient was given opportunity to ask questions. Patient verbalized understanding of the plan and was able to repeat key elements of the plan. All questions were answered to their satisfaction.  [name], FNP   I, [name], FNP, have reviewed all documentation for this visit. The documentation on 05/23/20 for the exam, diagnosis, procedures, and orders are all accurate and complete.   IF YOU HAVE BEEN REFERRED TO A SPECIALIST, IT MAY TAKE 1-2 WEEKS TO SCHEDULE/PROCESS THE REFERRAL. IF YOU HAVE NOT HEARD FROM US/SPECIALIST IN TWO WEEKS, PLEASE GIVE [place] A CALL AT [phone] X [phone].   THE PATIENT IS ENCOURAGED TO PRACTICE SOCIAL DISTANCING DUE TO THE COVID-19 PANDEMIC.

## 2020-05-23 NOTE — Patient Instructions (Signed)
Diabetes Mellitus Basics  Diabetes mellitus, or diabetes, is a long-term (chronic) disease. It occurs when the body does not properly use sugar (glucose) that is released from food after you eat. Diabetes mellitus may be caused by one or both of these problems:  Your pancreas does not make enough of a hormone called insulin.  Your body does not react in a normal way to the insulin that it makes. Insulin lets glucose enter cells in your body. This gives you energy. If you have diabetes, glucose cannot get into cells. This causes high blood glucose (hyperglycemia). How to treat and manage diabetes You may need to take insulin or other diabetes medicines daily to keep your glucose in balance. If you are prescribed insulin, you will learn how to give yourself insulin by injection. You may need to adjust the amount of insulin you take based on the foods that you eat. You will need to check your blood glucose levels using a glucose monitor as told by your health care provider. The readings can help determine if you have low or high blood glucose. Generally, you should have these blood glucose levels:  Before meals (preprandial): 80-130 mg/dL (4.4-7.2 mmol/L).  After meals (postprandial): below 180 mg/dL (10 mmol/L).  Hemoglobin A1c (HbA1c) level: less than 7%. Your health care provider will set treatment goals for you. Keep all follow-up visits. This is important. Follow these instructions at home: Diabetes medicines Take your diabetes medicines every day as told by your health care provider. List your diabetes medicines here:  Name of medicine: ______________________________ ? Amount (dose): _______________ Time (a.m./p.m.): _______________ Notes: ___________________________________  Name of medicine: ______________________________ ? Amount (dose): _______________ Time (a.m./p.m.): _______________ Notes: ___________________________________  Name of medicine:  ______________________________ ? Amount (dose): _______________ Time (a.m./p.m.): _______________ Notes: ___________________________________ Insulin If you use insulin, list the types of insulin you use here:  Insulin type: ______________________________ ? Amount (dose): _______________ Time (a.m./p.m.): _______________Notes: ___________________________________  Insulin type: ______________________________ ? Amount (dose): _______________ Time (a.m./p.m.): _______________ Notes: ___________________________________  Insulin type: ______________________________ ? Amount (dose): _______________ Time (a.m./p.m.): _______________ Notes: ___________________________________  Insulin type: ______________________________ ? Amount (dose): _______________ Time (a.m./p.m.): _______________ Notes: ___________________________________  Insulin type: ______________________________ ? Amount (dose): _______________ Time (a.m./p.m.): _______________ Notes: ___________________________________ Managing blood glucose Check your blood glucose levels using a glucose monitor as told by your health care provider. Write down the times that you check your glucose levels here:  Time: _______________ Notes: ___________________________________  Time: _______________ Notes: ___________________________________  Time: _______________ Notes: ___________________________________  Time: _______________ Notes: ___________________________________  Time: _______________ Notes: ___________________________________  Time: _______________ Notes: ___________________________________   Low blood glucose Low blood glucose (hypoglycemia) is when glucose is at or below 70 mg/dL (3.9 mmol/L). Symptoms may include:  Feeling: ? Hungry. ? Sweaty and clammy. ? Irritable or easily upset. ? Dizzy. ? Sleepy.  Having: ? A fast heartbeat. ? A headache. ? A change in your vision. ? Numbness around the mouth, lips, or  tongue.  Having trouble with: ? Moving (coordination). ? Sleeping. Treating low blood glucose To treat low blood glucose, eat or drink something containing sugar right away. If you can think clearly and swallow safely, follow the 15:15 rule:  Take 15 grams of a fast-acting carb (carbohydrate), as told by your health care provider.  Some fast-acting carbs are: ? Glucose tablets: take 3-4 tablets. ? Hard candy: eat 3-5 pieces. ? Fruit juice: drink 4 oz (120 mL). ? Regular (not diet) soda: drink 4-6 oz (120-180 mL). ? Honey or sugar:   eat 1 Tbsp (15 mL).  Check your blood glucose levels 15 minutes after you take the carb.  If your glucose is still at or below 70 mg/dL (3.9 mmol/L), take 15 grams of a carb again.  If your glucose does not go above 70 mg/dL (3.9 mmol/L) after 3 tries, get help right away.  After your glucose goes back to normal, eat a meal or a snack within 1 hour. Treating very low blood glucose If your glucose is at or below 54 mg/dL (3 mmol/L), you have very low blood glucose (severe hypoglycemia). This is an emergency. Do not wait to see if the symptoms will go away. Get medical help right away. Call your local emergency services (911 in the [place] Do not drive yourself to the hospital. Questions to ask your health care provider  Should I talk with a diabetes educator?  What equipment will I need to care for myself at home?  What diabetes medicines do I need? When should I take them?  How often do I need to check my blood glucose levels?  What number can I call if I have questions?  When is my follow-up visit?  Where can I find a support group for people with diabetes? Where to find more information  American Diabetes Association: www.diabetes.org  Association of Diabetes Care and Education Specialists: www.diabeteseducator.org Contact a health care provider if:  Your blood glucose is at or above 240 mg/dL (13.3 mmol/L) for 2 days in a row.  You have  been sick or have had a fever for 2 days or more, and you are not getting better.  You have any of these problems for more than 6 hours: ? You cannot eat or drink. ? You feel nauseous. ? You vomit. ? You have diarrhea. Get help right away if:  Your blood glucose is lower than 54 mg/dL (3 mmol/L).  You get confused.  You have trouble thinking clearly.  You have trouble breathing. These symptoms may represent a serious problem that is an emergency. Do not wait to see if the symptoms will go away. Get medical help right away. Call your local emergency services (911 in the [place] Do not drive yourself to the hospital. Summary  Diabetes mellitus is a chronic disease that occurs when the body does not properly use sugar (glucose) that is released from food after you eat.  Take insulin and diabetes medicines as told.  Check your blood glucose every day, as often as told.  Keep all follow-up visits. This is important. This information is not intended to replace advice given to you by your health care provider. Make sure you discuss any questions you have with your health care provider. Document Revised: 06/27/2019 Document Reviewed: 06/27/2019 Elsevier Patient Education  2021 Elsevier Inc.  

## 2020-05-24 LAB — CMP14+EGFR
ALT: 13 IU/L (ref 0–32)
AST: 21 IU/L (ref 0–40)
Albumin/Globulin Ratio: 1.6 (ref 1.2–2.2)
Albumin: 4.7 g/dL (ref 3.8–4.8)
Alkaline Phosphatase: 48 IU/L (ref 44–121)
BUN/Creatinine Ratio: 13 (ref 12–28)
BUN: 11 mg/dL (ref 8–27)
Bilirubin Total: 0.6 mg/dL (ref 0.0–1.2)
CO2: 23 mmol/L (ref 20–29)
Calcium: 9.4 mg/dL (ref 8.7–10.3)
Chloride: 101 mmol/L (ref 96–106)
Creatinine, Ser: 0.82 mg/dL (ref 0.57–1.00)
Globulin, Total: 3 g/dL (ref 1.5–4.5)
Glucose: 91 mg/dL (ref 65–99)
Potassium: 4.2 mmol/L (ref 3.5–5.2)
Sodium: 141 mmol/L (ref 134–144)
Total Protein: 7.7 g/dL (ref 6.0–8.5)
eGFR: 78 mL/min/{1.73_m2} (ref >59)

## 2020-05-24 LAB — LIPID PANEL
Chol/HDL Ratio: 3.5 ratio (ref 0.0–4.4)
Cholesterol, Total: 198 mg/dL (ref 100–199)
HDL: 56 mg/dL (ref >39)
LDL Chol Calc (NIH): 127 mg/dL — ABNORMAL HIGH (ref 0–99)
Triglycerides: 81 mg/dL (ref 0–149)
VLDL Cholesterol Cal: 15 mg/dL (ref 5–40)

## 2020-05-24 LAB — HEMOGLOBIN A1C
Est. average glucose Bld gHb Est-mCnc: 186 mg/dL
Hgb A1c MFr Bld: 8.1 % — ABNORMAL HIGH (ref 4.8–5.6)

## 2020-06-02 ENCOUNTER — Encounter: Payer: Self-pay | Admitting: Nurse Practitioner

## 2020-06-04 ENCOUNTER — Telehealth: Payer: Self-pay | Admitting: Nurse Practitioner

## 2020-06-04 ENCOUNTER — Other Ambulatory Visit: Payer: Self-pay | Admitting: Nurse Practitioner

## 2020-06-04 DIAGNOSIS — E119 Type 2 diabetes mellitus without complications: Secondary | ICD-10-CM

## 2020-06-04 MED ORDER — DAPAGLIFLOZIN PROPANEDIOL 5 MG PO TABS: 5.0000 mg | ORAL_TABLET | Freq: Every day | ORAL | 1 refills | Status: DC

## 2020-06-04 NOTE — Progress Notes (Signed)
I have resent the [place] to her mail order, she can come and pick up samples in the meantime

## 2020-06-04 NOTE — Telephone Encounter (Signed)
Left message for patient to call back and schedule Medicare Annual Wellness Visit (AWV) either virtually or in office.   Last AWV 05/17/19 please schedule at anytime with [facility]    This should be a 45 minute visit.  

## 2020-06-05 ENCOUNTER — Other Ambulatory Visit: Payer: Self-pay | Admitting: Nurse Practitioner

## 2020-06-05 DIAGNOSIS — M79604 Pain in right leg: Secondary | ICD-10-CM

## 2020-06-05 NOTE — Progress Notes (Signed)
Referral made to orthopedics

## 2020-06-06 ENCOUNTER — Telehealth: Payer: Self-pay

## 2020-06-06 ENCOUNTER — Telehealth: Payer: Medicare Other

## 2020-06-06 NOTE — Telephone Encounter (Signed)
  Chronic Care Management   Outreach Note  06/06/2020 Name: Jacqueline Bell MRN: [id number] DOB: 1950/08/21  Referred by: Arnette Felts, FNP Reason for referral : Chronic Care Management (RN CM FU Call Attempt )   An unsuccessful telephone outreach was attempted today. I spoke with [name] briefly, unfortunately she is unable to speak with me but would like a call back tomorrow morning. The patient was referred to the case management team for assistance with care management and care coordination.   Follow Up Plan: Telephone follow up appointment with care management team member scheduled for: 06/07/20  Delsa Sale, RN, BSN, CCM Care Management Coordinator [facility] Care Management/[facility]  Direct Phone: [phone]

## 2020-06-06 NOTE — Telephone Encounter (Signed)
-----   Message from Arnette Felts, FNP sent at 06/05/2020  4:26 PM EDT ----- Referral has been placed   ----- Message ----- From: Mariam Dollar, CMA Sent: 05/31/2020  10:16 AM EDT To: Arnette Felts, FNP  The pt said that she went for a massage and was told that she may need to see an orthopedic for a torn muscle on her right thigh. I asked the pt was she seen for this and she said yes by [facility].

## 2020-06-07 ENCOUNTER — Telehealth: Payer: Medicare Other

## 2020-06-07 ENCOUNTER — Telehealth: Payer: Self-pay

## 2020-06-07 NOTE — Telephone Encounter (Signed)
  Chronic Care Management   Outreach Note  06/07/2020 Name: Jacqueline Bell MRN: [id number] DOB: 1951-03-02  Referred by: Arnette Felts, FNP Reason for referral : Chronic Care Management (#2 RN CM FU Call Attempt)   A second unsuccessful telephone outreach was attempted today. The patient was referred to the case management team for assistance with care management and care coordination.   Follow Up Plan: A HIPAA compliant phone message was left for the patient providing contact information and requesting a return call. Telephone follow up appointment with care management team member scheduled for: 07/11/20  Delsa Sale, RN, BSN, CCM Care Management Coordinator [facility] Care Management/[facility]  Direct Phone: [phone]

## 2020-06-10 ENCOUNTER — Telehealth: Payer: Self-pay | Admitting: Nurse Practitioner

## 2020-06-10 NOTE — Telephone Encounter (Signed)
Left message for patient to call back and schedule Medicare Annual Wellness Visit (AWV) either virtually or in office.   Last AWV 05/17/19  please schedule at anytime with [facility]    This should be a 45 minute visit.

## 2020-06-12 ENCOUNTER — Ambulatory Visit (INDEPENDENT_AMBULATORY_CARE_PROVIDER_SITE_OTHER): Payer: Medicare Other | Admitting: Family Medicine

## 2020-06-12 ENCOUNTER — Other Ambulatory Visit: Payer: Self-pay

## 2020-06-12 VITALS — BP 142/68 | Ht 67.5 in | Wt 187.0 lb

## 2020-06-12 DIAGNOSIS — R2241 Localized swelling, mass and lump, right lower limb: Secondary | ICD-10-CM

## 2020-06-12 NOTE — Patient Instructions (Signed)
It was great to meet you today! Thank you for letting me participate in your care!  Today, we discussed your right thigh pain and the mass that you have noticed in your right leg. I am getting an MRI for further evaluation and will call you once I have the results. Based on the results we will discuss what the next best steps to take will be.  Be well, Jules Schick, DO PGY-4, Sports Medicine Fellow [facility]

## 2020-06-12 NOTE — Progress Notes (Signed)
    SUBJECTIVE:   CHIEF COMPLAINT / HPI:   Right leg mass [name] is a very pleasant 70 year old female who comes in today for right leg soreness and a noticeable right leg mass.  She noticed it about a year ago she is unsure if it has been getting progressively bigger or worse.  She states the soreness is intermittent and not constant and is not intense but it is tender.  She does get leg cramps at night as well.  Certain movements and laying on that side tend to make it worse.  She has had no trauma or no falls or anything that happened a year ago that she can think that would have caused this.  Overall he is very stable but she does complain of intermittent numbness over the mid and lateral aspect of the right thigh.  Nothing below the knee.  PERTINENT  PMH / PSH: Type 2 diabetes, seasonal allergies, insomnia  OBJECTIVE:   BP (!) 142/68   Ht 5' 7.5" (1.715 m)   Wt 187 lb (84.8 kg)   BMI 28.86 kg/m   No flowsheet data found.  MSK: Noticeable palpable large mass on the mid lateral right thigh.  It is tender to palpation.  No obvious skin deformity overlying the mass no erythema, no warmth, no ecchymosis.  She has good range of motion of the right hip and right knee without pain.  Strength and sensation of both lower extremity is equal and symmetric bilaterally.  2+ patellar and Achilles reflexes bilaterally.  ASSESSMENT/PLAN:   Leg mass, right Given exam and history of this is been ongoing for one year with it becoming tender and starting to possibly cause cramps in having her wake up at night I do recommend we get an MRI for further evaluation.  I will see her after MRI has been completed we will discuss results.     Arlyce Harman, DO PGY-4, Sports Medicine Fellow [facility]  Addendum:  I was the preceptor for this visit and available for immediate consultation.  Norton Blizzard MD Marrianne Mood

## 2020-06-13 DIAGNOSIS — R2241 Localized swelling, mass and lump, right lower limb: Secondary | ICD-10-CM | POA: Insufficient documentation

## 2020-06-13 NOTE — Assessment & Plan Note (Signed)
Given exam and history of this is been ongoing for one year with it becoming tender and starting to possibly cause cramps in having her wake up at night I do recommend we get an MRI for further evaluation.  I will see her after MRI has been completed we will discuss results.

## 2020-06-18 ENCOUNTER — Telehealth: Payer: Self-pay

## 2020-06-18 NOTE — Telephone Encounter (Signed)
Patient notified that her PA for [name] has been approved [id number],[id number]

## 2020-06-21 ENCOUNTER — Other Ambulatory Visit: Payer: Self-pay | Admitting: Nurse Practitioner

## 2020-06-21 DIAGNOSIS — E559 Vitamin D deficiency, unspecified: Secondary | ICD-10-CM

## 2020-06-24 DIAGNOSIS — Z1231 Encounter for screening mammogram for malignant neoplasm of breast: Secondary | ICD-10-CM | POA: Diagnosis not present

## 2020-06-24 LAB — HM MAMMOGRAPHY

## 2020-06-27 ENCOUNTER — Telehealth: Payer: Self-pay

## 2020-06-27 NOTE — Chronic Care Management (AMB) (Signed)
Chronic Care Management Pharmacy Assistant   Name: Jacqueline Bell  MRN: [id number] DOB: May 22, 1950   Reason for Encounter: Disease State-DM   Recent office visits:  05/23/20 -Arnette Felts, FNP(PCP) 02/22/20- Arnette Felts, FNP(PCP)  Recent consult visits:  06/12/20 Arlyce Harman, DO (Sports Med)  Hospital visits:  None in previous 6 months  Medications: Outpatient Encounter Medications as of 06/27/2020  Medication Sig Note  . cholecalciferol (VITAMIN D) 1000 units tablet Take 1,000 Units by mouth daily.   . dapagliflozin propanediol (FARXIGA) 5 MG TABS tablet Take 1 tablet (5 mg total) by mouth daily before breakfast.   . EPINEPHrine 0.3 mg/0.3 mL IJ SOAJ injection    . fluticasone (FLONASE) 50 MCG/ACT nasal spray USE 1 SPRAY IN EACH NOSTRIL DAILY   . insulin aspart (NOVOLOG FLEXPEN) 100 UNIT/ML FlexPen Per SS: < 70 = 0; 70-90 = 4 units; 91-130 = 6 units; 131-150 = 7 units; 151-200 = 8 units; 201-250 = 9 units; 251-300 = 10 units; 301-350 = 11 units; 351-400 = 12 units; 401-450 = 13 units; > 450 = 14 units.   . insulin degludec (TRESIBA FLEXTOUCH) 100 UNIT/ML FlexTouch Pen Inject 20 Units into the skin daily.   [name] loratadine (CLARITIN) 10 MG tablet Take 1 tablet (10 mg total) by mouth daily.   . magnesium oxide (MAG-OX) 400 MG tablet Take 400 mg by mouth daily.   . meloxicam (MOBIC) 15 MG tablet TAKE 1 TABLET AS NEEDED   . Multiple Vitamin (MULTIVITAMIN) tablet Take 1 tablet by mouth daily.   . Olopatadine HCl (PAZEO) 0.7 % SOLN Place 1 drop into both eyes daily. 05/19/2018: Patient is taking as needed.   . Omega-3 Fatty Acids (FISH OIL PO) Take 2 capsules by mouth daily.    [name] OZEMPIC, 1 MG/DOSE, 2 MG/1.5ML SOPN INJECT 1 MG UNDER THE SKIN ONCE WEEKLY   . POTASSIUM BICARBONATE PO Take 1 tablet by mouth. Patient takes 2 tablets daily   . pravastatin (PRAVACHOL) 40 MG tablet Take 1 tablet (40 mg total) by mouth every evening.   [name] telmisartan (MICARDIS) 20 MG tablet Take 1 tablet  (20 mg total) by mouth daily.    No facility-administered encounter medications on file as of 06/27/2020.    Recent Relevant Labs: Lab Results  Component Value Date/Time   HGBA1C 8.1 (H) 05/23/2020 01:01 PM   HGBA1C 7.6 (H) 02/22/2020 12:11 PM   MICROALBUR 10 05/17/2019 12:47 PM   MICROALBUR 30 05/05/2018 01:12 PM    Kidney Function Lab Results  Component Value Date/Time   CREATININE 0.82 05/23/2020 01:01 PM   CREATININE 0.74 02/22/2020 12:11 PM   GFRNONAA 84 02/22/2020 12:11 PM   GFRAA 96 02/22/2020 12:11 PM    . Current antihyperglycemic regimen:  o Farxiga 5 mg take 1 tablet daily before breakfast o Novolog 100 unit/ml o Tresiba inject 20 units daily o Ozempic 2mg /1.66ml  . What recent interventions/DTPs have been made to improve glycemic control:  o None noted  . Have there been any recent hospitalizations or ED visits since last visit with CPP? No   . Patient reports hypoglycemic symptoms, including Sweaty and Shaky. Patient states she does not eat enough sometimes and then she feels sweaty and shaky.  . Patient denies hyperglycemic symptoms, including blurry vision, excessive thirst, fatigue, polyuria and weakness   . How often are you checking your blood sugar? 3-4 times daily   . What are your blood sugars ranging?  o Fasting: 101 o Before  meals: 147 o After meals:  o Bedtime: 140s  . During the week, how often does your blood glucose drop below 70? Rarely   . Are you checking your feet daily/regularly?   Patient states she checks her feet twice a day.  Adherence Review:  Is the patient currently on a STATIN medication? Yes Is the patient currently on ACE/ARB medication? Yes Does the patient have >5 day gap between last estimated fill dates? Yes    Star Rating Drugs: Farxiga 5 mg last filled 06/13/20 90DS Pravastatin 40 mg last filled 03/12/20 90DS telmisartan 20 mg last filled 10/15/19 90DS Ozempic 2mg /1.71ml last filled 06/21/20 84DS    [facility] Clinical Pharmacist Assistant [phone]

## 2020-06-30 ENCOUNTER — Other Ambulatory Visit: Payer: Self-pay

## 2020-06-30 ENCOUNTER — Ambulatory Visit
Admission: RE | Admit: 2020-06-30 | Discharge: 2020-06-30 | Disposition: A | Discharge status: Home / Self Care | Payer: Medicare Other | Source: Ambulatory Visit | Attending: Family Medicine | Admitting: Family Medicine

## 2020-06-30 DIAGNOSIS — M2548 Effusion, other site: Secondary | ICD-10-CM | POA: Diagnosis not present

## 2020-06-30 DIAGNOSIS — M25461 Effusion, right knee: Secondary | ICD-10-CM | POA: Diagnosis not present

## 2020-06-30 DIAGNOSIS — R2241 Localized swelling, mass and lump, right lower limb: Secondary | ICD-10-CM

## 2020-06-30 DIAGNOSIS — R531 Weakness: Secondary | ICD-10-CM | POA: Diagnosis not present

## 2020-06-30 DIAGNOSIS — R200 Anesthesia of skin: Secondary | ICD-10-CM | POA: Diagnosis not present

## 2020-07-01 ENCOUNTER — Encounter: Payer: Self-pay | Admitting: Nurse Practitioner

## 2020-07-03 ENCOUNTER — Telehealth: Payer: Self-pay | Admitting: Family Medicine

## 2020-07-03 NOTE — Telephone Encounter (Signed)
Called patient and left VM on 4/26 informing her that I would like to discuss her MRI results.  Called again both home and work numbers on 4/27 and unable to reach patient.   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  I will attempt to call patient again to speak with her directly so I can explain the reason for the referral.  Jules Schick, DO

## 2020-07-04 NOTE — Telephone Encounter (Signed)
Referral to Dr. Andrey Campanile  [facility] 4th Floor [facility] [place], [place] [address] [phone]  Appt: 07/23/20 @ 1:30 pm

## 2020-07-11 ENCOUNTER — Telehealth: Payer: Medicare Other

## 2020-07-22 ENCOUNTER — Telehealth: Payer: Self-pay

## 2020-07-22 NOTE — Chronic Care Management (AMB) (Addendum)
Chronic Care Management Pharmacy Assistant   Name: Jacqueline Bell  MRN: [id number] DOB: February 03, 1951  Reason for Encounter: Disease State/ Diabetes    Recent office visits:  None  Recent consult visits:  07-23-2020 Jacqueline Byes, MD (Orthopaedics)  Hospital visits:  None in previous 6 months  Medications: Outpatient Encounter Medications as of 07/22/2020  Medication Sig Note  . cholecalciferol (VITAMIN D) 1000 units tablet Take 1,000 Units by mouth daily.   . dapagliflozin propanediol (FARXIGA) 5 MG TABS tablet Take 1 tablet (5 mg total) by mouth daily before breakfast.   . EPINEPHrine 0.3 mg/0.3 mL IJ SOAJ injection    . fluticasone (FLONASE) 50 MCG/ACT nasal spray USE 1 SPRAY IN EACH NOSTRIL DAILY   . insulin aspart (NOVOLOG FLEXPEN) 100 UNIT/ML FlexPen Per SS: < 70 = 0; 70-90 = 4 units; 91-130 = 6 units; 131-150 = 7 units; 151-200 = 8 units; 201-250 = 9 units; 251-300 = 10 units; 301-350 = 11 units; 351-400 = 12 units; 401-450 = 13 units; > 450 = 14 units.   . insulin degludec (TRESIBA FLEXTOUCH) 100 UNIT/ML FlexTouch Pen Inject 20 Units into the skin daily.   [name] loratadine (CLARITIN) 10 MG tablet Take 1 tablet (10 mg total) by mouth daily.   . magnesium oxide (MAG-OX) 400 MG tablet Take 400 mg by mouth daily.   . meloxicam (MOBIC) 15 MG tablet TAKE 1 TABLET AS NEEDED   . Multiple Vitamin (MULTIVITAMIN) tablet Take 1 tablet by mouth daily.   . Olopatadine HCl (PAZEO) 0.7 % SOLN Place 1 drop into both eyes daily. 05/19/2018: Patient is taking as needed.   . Omega-3 Fatty Acids (FISH OIL PO) Take 2 capsules by mouth daily.    [name] OZEMPIC, 1 MG/DOSE, 2 MG/1.5ML SOPN INJECT 1 MG UNDER THE SKIN ONCE WEEKLY   . POTASSIUM BICARBONATE PO Take 1 tablet by mouth. Patient takes 2 tablets daily   . pravastatin (PRAVACHOL) 40 MG tablet Take 1 tablet (40 mg total) by mouth every evening.   [name] telmisartan (MICARDIS) 20 MG tablet Take 1 tablet (20 mg total) by mouth daily.    No  facility-administered encounter medications on file as of 07/22/2020.   Recent Relevant Labs: Lab Results  Component Value Date/Time   HGBA1C 8.1 (H) 05/23/2020 01:01 PM   HGBA1C 7.6 (H) 02/22/2020 12:11 PM   MICROALBUR 10 05/17/2019 12:47 PM   MICROALBUR 30 05/05/2018 01:12 PM    Kidney Function Lab Results  Component Value Date/Time   CREATININE 0.82 05/23/2020 01:01 PM   CREATININE 0.74 02/22/2020 12:11 PM   GFRNONAA 84 02/22/2020 12:11 PM   GFRAA 96 02/22/2020 12:11 PM    . Current antihyperglycemic regimen:  ? Farxiga 5 mg take 1 tablet daily before breakfast (samples) ? Novolog 100 unit/ml ? [name] inject 20 units daily ? Ozempic 2mg /1.75ml  . What recent interventions/DTPs have been made to improve glycemic control:  o Patient states she is taking medications as directed.  . Have there been any recent hospitalizations or ED visits since last visit with CPP? No   . Patient denies hypoglycemic symptoms  . Patient denies hyperglycemic symptoms  . How often are you checking your blood sugar? twice daily   . What are your blood sugars ranging?  o Fasting: 92 o Before meals: 116 o After meals: 123 o Bedtime: 169  . During the week, how often does your blood glucose drop below 70? Never   . Are you checking  your feet daily/regularly? Patient states she checks her feet twice daily  Adherence Review: Is the patient currently on a STATIN medication? Yes Is the patient currently on ACE/ARB medication? Yes Does the patient have >5 day gap between last estimated fill dates? No  NOTES: Patient states she hasn't received [place] yet. Sent message to Margarito Liner CMA asking to follow up and let me know if patient should come by for samples. Contacted patient to inform her that Rx for [name] [facility] on 06-04-20 for 60 day supply and to contact [facility]. Office is currently out of Farxiga samples.  Star Rating Drugs: Farxiga 5  mg- Last filled 06-13-2020 [facility] Pravastatin 40 mg- Last filled 03-12-2020 [facility] Telmisartan 20 mg- Last filled 10-15-2019 [facility]  Ozempic 2mg /1.33ml- Last filled 06-21-2020 [facility]   06-23-2020 [facility]  [phone]

## 2020-07-23 DIAGNOSIS — M7989 Other specified soft tissue disorders: Secondary | ICD-10-CM | POA: Diagnosis not present

## 2020-08-08 ENCOUNTER — Telehealth: Payer: Self-pay

## 2020-08-08 NOTE — Chronic Care Management (AMB) (Signed)
    Chronic Care Management Pharmacy Assistant   Name: Jacqueline Bell  MRN: [id number] DOB: [birth year]   Reason for Encounter: Adherence call/ Diabetes    Recent office visits:  None  Recent consult visits:  07-23-2020 [name], MD (Orthopaedics)  Hospital visits:  None in previous 6 months  Medications: Outpatient Encounter Medications as of 08/08/2020  Medication Sig Note   cholecalciferol (VITAMIN D) 1000 units tablet Take 1,000 Units by mouth daily.    dapagliflozin propanediol (FARXIGA) 5 MG TABS tablet Take 1 tablet (5 mg total) by mouth daily before breakfast.    EPINEPHrine 0.3 mg/0.3 mL IJ SOAJ injection     fluticasone (FLONASE) 50 MCG/ACT nasal spray USE 1 SPRAY IN EACH NOSTRIL DAILY    insulin aspart (NOVOLOG FLEXPEN) 100 UNIT/ML FlexPen Per SS: < 70 = 0; 70-90 = 4 units; 91-130 = 6 units; 131-150 = 7 units; 151-200 = 8 units; 201-250 = 9 units; 251-300 = 10 units; 301-350 = 11 units; 351-400 = 12 units; 401-450 = 13 units; > 450 = 14 units.    insulin degludec (TRESIBA FLEXTOUCH) 100 UNIT/ML FlexTouch Pen Inject 20 Units into the skin daily.    loratadine (CLARITIN) 10 MG tablet Take 1 tablet (10 mg total) by mouth daily.    magnesium oxide (MAG-OX) 400 MG tablet Take 400 mg by mouth daily.    meloxicam (MOBIC) 15 MG tablet TAKE 1 TABLET AS NEEDED    Multiple Vitamin (MULTIVITAMIN) tablet Take 1 tablet by mouth daily.    Olopatadine HCl (PAZEO) 0.7 % SOLN Place 1 drop into both eyes daily. 05/19/2018: Patient is taking as needed.    Omega-3 Fatty Acids (FISH OIL PO) Take 2 capsules by mouth daily.     OZEMPIC, 1 MG/DOSE, 2 MG/1.5ML SOPN INJECT 1 MG UNDER THE SKIN ONCE WEEKLY    POTASSIUM BICARBONATE PO Take 1 tablet by mouth. Patient takes 2 tablets daily    pravastatin (PRAVACHOL) 40 MG tablet Take 1 tablet (40 mg total) by mouth every evening.    telmisartan (MICARDIS) 20 MG tablet Take 1 tablet (20 mg total) by mouth daily.    No facility-administered  encounter medications on file as of 08/08/2020.     08-08-2020: 1st attempt Left VM 08-21-2020: 2nd attempt left VM 08-26-2020: 3rd attempt  Star Rating Drugs: Farxiga 5 mg- Last filled 06-13-2020 [facility] Pravastatin 40 mg- Last filled 03-12-2020 [facility] Telmisartan 20 mg- Last filled 10-15-2019 [facility]  Ozempic 2mg /1.71ml- Last filled 06-21-2020 [facility]   06-23-2020 CMA Clinical Pharmacist Assistant [phone]

## 2020-08-27 ENCOUNTER — Other Ambulatory Visit: Payer: Self-pay

## 2020-08-27 ENCOUNTER — Encounter: Payer: Self-pay | Admitting: Nurse Practitioner

## 2020-08-27 ENCOUNTER — Ambulatory Visit (INDEPENDENT_AMBULATORY_CARE_PROVIDER_SITE_OTHER): Payer: Medicare Other | Admitting: Nurse Practitioner

## 2020-08-27 VITALS — BP 142/78 | HR 78 | Temp 98.7°F | Ht 67.0 in | Wt 191.2 lb

## 2020-08-27 DIAGNOSIS — Z23 Encounter for immunization: Secondary | ICD-10-CM

## 2020-08-27 DIAGNOSIS — Z01818 Encounter for other preprocedural examination: Secondary | ICD-10-CM

## 2020-08-27 DIAGNOSIS — E559 Vitamin D deficiency, unspecified: Secondary | ICD-10-CM

## 2020-08-27 DIAGNOSIS — E119 Type 2 diabetes mellitus without complications: Secondary | ICD-10-CM | POA: Diagnosis not present

## 2020-08-27 DIAGNOSIS — I10 Essential (primary) hypertension: Secondary | ICD-10-CM

## 2020-08-27 DIAGNOSIS — Z794 Long term (current) use of insulin: Secondary | ICD-10-CM

## 2020-08-27 DIAGNOSIS — R2241 Localized swelling, mass and lump, right lower limb: Secondary | ICD-10-CM

## 2020-08-27 MED ORDER — SHINGRIX 50 MCG/0.5ML IM SUSR: 0.5000 mL | Freq: Once | INTRAMUSCULAR | 0 refills | Status: AC

## 2020-08-27 NOTE — Progress Notes (Signed)
I,Deloris Moger,acting as a Education administrator for [name], FNP.,have documented all relevant documentation on the behalf of [name], FNP,as directed by  [name], FNP while in the presence of [name], [place].  This visit occurred during the SARS-CoV-2 public health emergency.  Safety protocols were in place, including screening questions prior to the visit, additional usage of staff PPE, and extensive cleaning of exam room while observing appropriate contact time as indicated for disinfecting solutions.  Subjective:     Patient ID: Jacqueline Bell , female    DOB: 01/17/1951 , 70 y.o.   MRN: [id number]   Chief Complaint  Patient presents with   Diabetes   Hypertension    HPI  Pt is here today for a dm & htn check. Pt has a upcoming surgery on right leg July 9th she has concerns about the surgery being done being that it is not affecting her everyday life. She would like to reschedule due to going out of town to [place]. At this time she does not feel like it is affecting her at this time, no pain.   Wt Readings from Last 3 Encounters: 08/27/20 : 191 lb 3.2 oz (86.7 kg) 06/12/20 : 187 lb (84.8 kg) 05/23/20 : 191 lb 9.6 oz (86.9 kg)     Diabetes She presents for her follow-up diabetic visit. She has type 2 diabetes mellitus. Her disease course has been stable. Pertinent negatives for hypoglycemia include no confusion, dizziness or headaches. There are no diabetic associated symptoms. Pertinent negatives for diabetes include no chest pain, no fatigue, no polydipsia, no polyphagia, no polyuria and no weakness. There are no hypoglycemic complications. Symptoms are stable. There are no diabetic complications. Risk factors for coronary artery disease include sedentary lifestyle and obesity. Current diabetic treatment includes oral agent (dual therapy). She is compliant with treatment all of the time. Her weight is stable. She is following a generally healthy diet. When asked about meal  planning, she reported none. She has not had a previous visit with a dietitian. She rarely participates in exercise. There is no change in her home blood glucose trend. (She is now using the Libre    Average over the last 30 days is 146) An ACE inhibitor/angiotensin II receptor blocker is being taken. She does not see a podiatrist.Eye exam is not current.  Hypertension This is a chronic problem. The current episode started more than 1 year ago. The problem is unchanged. The problem is uncontrolled. Pertinent negatives include no anxiety, chest pain, headaches or palpitations. Risk factors for coronary artery disease include obesity, sedentary lifestyle and diabetes mellitus. Past treatments include ACE inhibitors. The current treatment provides mild improvement. There are no compliance problems.  There is no history of angina or kidney disease. There is no history of chronic renal disease.  Knee Pain  The incident occurred more than 1 week ago. There was no injury mechanism. The pain is present in the right knee. The quality of the pain is described as aching. Nothing aggravates the symptoms.    Past Medical History:  Diagnosis Date   Diabetes mellitus without complication ([place])      History reviewed. No pertinent family history.   Current Outpatient Medications:    cholecalciferol (VITAMIN D) 1000 units tablet, Take 1,000 Units by mouth daily., Disp: , Rfl:    dapagliflozin propanediol (FARXIGA) 5 MG TABS tablet, Take 1 tablet (5 mg total) by mouth daily before breakfast., Disp: 90 tablet, Rfl: 1   EPINEPHrine 0.3 mg/0.3  mL IJ SOAJ injection, , Disp: , Rfl:    fluticasone (FLONASE) 50 MCG/ACT nasal spray, USE 1 SPRAY IN EACH NOSTRIL DAILY, Disp: 16 g, Rfl: 0   insulin aspart (NOVOLOG FLEXPEN) 100 UNIT/ML FlexPen, Per SS: < 70 = 0; 70-90 = 4 units; 91-130 = 6 units; 131-150 = 7 units; 151-200 = 8 units; 201-250 = 9 units; 251-300 = 10 units; 301-350 = 11 units; 351-400 = 12 units; 401-450 = 13  units; > 450 = 14 units., Disp: 15 mL, Rfl: 11   insulin degludec (TRESIBA FLEXTOUCH) 100 UNIT/ML FlexTouch Pen, Inject 20 Units into the skin daily., Disp: 9 mL, Rfl: 1   magnesium oxide (MAG-OX) 400 MG tablet, Take 400 mg by mouth daily., Disp: , Rfl:    Multiple Vitamin (MULTIVITAMIN) tablet, Take 1 tablet by mouth daily., Disp: , Rfl:    Olopatadine HCl (PAZEO) 0.7 % SOLN, Place 1 drop into both eyes daily., Disp: 1 Bottle, Rfl: 5   Omega-3 Fatty Acids (FISH OIL PO), Take 2 capsules by mouth daily. , Disp: , Rfl:    OZEMPIC, 1 MG/DOSE, 2 MG/1.5ML SOPN, INJECT 1 MG UNDER THE SKIN ONCE WEEKLY, Disp: 9 mL, Rfl: 3   POTASSIUM BICARBONATE PO, Take 1 tablet by mouth. Patient takes 2 tablets daily, Disp: , Rfl:    loratadine (CLARITIN) 10 MG tablet, TAKE 1 TABLET DAILY, Disp: 90 tablet, Rfl: 3   meloxicam (MOBIC) 15 MG tablet, TAKE 1 TABLET AS NEEDED, Disp: 90 tablet, Rfl: 3   pravastatin (PRAVACHOL) 40 MG tablet, TAKE 1 TABLET EVERY EVENING, Disp: 90 tablet, Rfl: 3   telmisartan (MICARDIS) 20 MG tablet, TAKE 1 TABLET DAILY, Disp: 90 tablet, Rfl: 3   Allergies  Allergen Reactions   Iodine Hives   Shellfish Allergy Hives     Review of Systems  Constitutional: Negative.  Negative for fatigue.  Respiratory: Negative.    Cardiovascular: Negative.  Negative for chest pain and palpitations.  Endocrine: Negative for polydipsia, polyphagia and polyuria.  Neurological: Negative.  Negative for dizziness, weakness and headaches.  Psychiatric/Behavioral: Negative.  Negative for confusion.     Today's Vitals   08/27/20 1147  BP: (!) 142/78  Pulse: 78  Temp: 98.7 F (37.1 C)  SpO2: 99%  Weight: 191 lb 3.2 oz (86.7 kg)  Height: $Remove'5\' 7"'vKJXYxS$  (1.702 m)   Body mass index is 29.95 kg/m.  Wt Readings from Last 3 Encounters:  09/25/20 183 lb (83 kg)  08/27/20 191 lb 3.2 oz (86.7 kg)  06/12/20 187 lb (84.8 kg)    Objective:  Physical Exam Vitals reviewed.  Constitutional:      General: She is not in  acute distress.    Appearance: Normal appearance. She is obese.  Cardiovascular:     Rate and Rhythm: Normal rate and regular rhythm.     Pulses: Normal pulses.     Heart sounds: Normal heart sounds. No murmur heard. Pulmonary:     Effort: Pulmonary effort is normal. No respiratory distress.     Breath sounds: Normal breath sounds. No wheezing.  Musculoskeletal:        General: Tenderness (right anterior knee) present. No swelling. Normal range of motion.     Cervical back: Normal range of motion and neck supple.     Right lower leg: No edema.     Left lower leg: No edema.     Comments: She is limping slightly when walking  Skin:    General: Skin is warm and dry.  Capillary Refill: Capillary refill takes less than 2 seconds.  Neurological:     General: No focal deficit present.     Mental Status: She is alert and oriented to person, place, and time.     Cranial Nerves: No cranial nerve deficit.  Psychiatric:        Mood and Affect: Mood normal.        Behavior: Behavior normal.        Thought Content: Thought content normal.        Judgment: Judgment normal.        Assessment And Plan:     1. Type 2 diabetes mellitus without complication, with long-term current use of insulin (HCC) Comments: Slight improvement at last visit  - Lipid panel - CMP14+EGFR - Hemoglobin A1c - EKG 12-Lead  2. Essential hypertension Comments: Fair control.  Continue current medications - CMP14+EGFR  3. Encounter for immunization Rx's sent to pharmacy - Pneumococcal conjugate vaccine 20-valent (Prevnar 20) - Zoster Vaccine Adjuvanted [facility]) injection; Inject 0.5 mLs into the muscle once for 1 dose.  Dispense: 0.5 mL; Refill: 0  4. Vitamin D deficiency Will check vitamin D level and supplement as needed.    Also encouraged to spend 15 minutes in the sun daily.   5. Pre-op exam She is at moderate risk for surgery, she is cleared for surgery HgbA1c is currently stable.  EKG done  with NSR HR 77 - CBC - EKG 12-Lead  6. Mass of right lower extremity   She is to have a mass removed from her right thigh area  Patient was given opportunity to ask questions. Patient verbalized understanding of the plan and was able to repeat key elements of the plan. All questions were answered to their satisfaction.  [name], FNP   I, [name], FNP, have reviewed all documentation for this visit. The documentation on 09/29/20 for the exam, diagnosis, procedures, and orders are all accurate and complete.   IF YOU HAVE BEEN REFERRED TO A SPECIALIST, IT MAY TAKE 1-2 WEEKS TO SCHEDULE/PROCESS THE REFERRAL. IF YOU HAVE NOT HEARD FROM US/SPECIALIST IN TWO WEEKS, PLEASE GIVE [place] A CALL AT [phone] X [phone].   THE PATIENT IS ENCOURAGED TO PRACTICE SOCIAL DISTANCING DUE TO THE COVID-19 PANDEMIC.

## 2020-08-28 LAB — CMP14+EGFR
ALT: 14 IU/L (ref 0–32)
AST: 18 IU/L (ref 0–40)
Albumin/Globulin Ratio: 1.5 (ref 1.2–2.2)
Albumin: 4.6 g/dL (ref 3.8–4.8)
Alkaline Phosphatase: 59 IU/L (ref 44–121)
BUN/Creatinine Ratio: 14 (ref 12–28)
BUN: 11 mg/dL (ref 8–27)
Bilirubin Total: 0.3 mg/dL (ref 0.0–1.2)
CO2: 24 mmol/L (ref 20–29)
Calcium: 8.9 mg/dL (ref 8.7–10.3)
Chloride: 100 mmol/L (ref 96–106)
Creatinine, Ser: 0.81 mg/dL (ref 0.57–1.00)
Globulin, Total: 3 g/dL (ref 1.5–4.5)
Glucose: 140 mg/dL — ABNORMAL HIGH (ref 65–99)
Potassium: 4.2 mmol/L (ref 3.5–5.2)
Sodium: 138 mmol/L (ref 134–144)
Total Protein: 7.6 g/dL (ref 6.0–8.5)
eGFR: 79 mL/min/{1.73_m2} (ref >59)

## 2020-08-28 LAB — CBC
Hematocrit: 38.2 % (ref 34.0–46.6)
Hemoglobin: 12.9 g/dL (ref 11.1–15.9)
MCH: 28.9 pg (ref 26.6–33.0)
MCHC: 33.8 g/dL (ref 31.5–35.7)
MCV: 86 fL (ref 79–97)
Platelets: 275 10*3/uL (ref 150–450)
RBC: 4.46 x10E6/uL (ref 3.77–5.28)
RDW: 12.8 % (ref 11.7–15.4)
WBC: 4 10*3/uL (ref 3.4–10.8)

## 2020-08-28 LAB — HEMOGLOBIN A1C
Est. average glucose Bld gHb Est-mCnc: 171 mg/dL
Hgb A1c MFr Bld: 7.6 % — ABNORMAL HIGH (ref 4.8–5.6)

## 2020-08-28 LAB — LIPID PANEL
Chol/HDL Ratio: 4.2 ratio (ref 0.0–4.4)
Cholesterol, Total: 225 mg/dL — ABNORMAL HIGH (ref 100–199)
HDL: 54 mg/dL (ref >39)
LDL Chol Calc (NIH): 151 mg/dL — ABNORMAL HIGH (ref 0–99)
Triglycerides: 110 mg/dL (ref 0–149)
VLDL Cholesterol Cal: 20 mg/dL (ref 5–40)

## 2020-09-03 ENCOUNTER — Telehealth: Payer: Self-pay | Admitting: Nurse Practitioner

## 2020-09-03 NOTE — Telephone Encounter (Signed)
Left message for patient to call back and schedule Medicare Annual Wellness Visit (AWV) either virtually or in office.   Last AWV 05/17/19 please schedule at anytime with [facility]    This should be a 45 minute visit.

## 2020-09-05 ENCOUNTER — Ambulatory Visit (INDEPENDENT_AMBULATORY_CARE_PROVIDER_SITE_OTHER): Payer: Medicare Other

## 2020-09-05 ENCOUNTER — Telehealth: Payer: Medicare Other

## 2020-09-05 ENCOUNTER — Telehealth: Payer: Self-pay

## 2020-09-05 DIAGNOSIS — I10 Essential (primary) hypertension: Secondary | ICD-10-CM | POA: Diagnosis not present

## 2020-09-05 DIAGNOSIS — E119 Type 2 diabetes mellitus without complications: Secondary | ICD-10-CM

## 2020-09-05 DIAGNOSIS — E559 Vitamin D deficiency, unspecified: Secondary | ICD-10-CM

## 2020-09-05 DIAGNOSIS — Z794 Long term (current) use of insulin: Secondary | ICD-10-CM

## 2020-09-05 NOTE — Progress Notes (Signed)
Recent Relevant Labs: Lab Results  Component Value Date/Time   HGBA1C 7.6 (H) 08/27/2020 12:56 PM   HGBA1C 8.1 (H) 05/23/2020 01:01 PM   MICROALBUR 10 05/17/2019 12:47 PM   MICROALBUR 30 05/05/2018 01:12 PM    Kidney Function Lab Results  Component Value Date/Time   CREATININE 0.81 08/27/2020 12:56 PM   CREATININE 0.82 05/23/2020 01:01 PM   GFRNONAA 84 02/22/2020 12:11 PM   GFRAA 96 02/22/2020 12:11 PM   Patient's telephone appointment with Cherylin Mylar CPP is on 09-12-2020 at 11:30.  Star Rating Drugs: Farxiga 5 mg- Last filled 06-13-2020 [facility] Pravastatin 40 mg- Last filled 03-12-2020 [facility] Telmisartan 20 mg- Last filled 10-15-2019 [facility]  Ozempic 2mg /1.15ml- Last filled 06-21-2020 [facility]   06-23-2020 CMA Clinical Pharmacist Assistant [phone]

## 2020-09-11 ENCOUNTER — Telehealth: Payer: Self-pay

## 2020-09-11 NOTE — Chronic Care Management (AMB) (Signed)
   No answer, left message of telephone appointment with Cherylin Mylar CPP on 09-12-2020 at 11:30. Left message to have all medications, supplements, blood pressure and/or blood sugar logs available during appointment and to return call if need to reschedule.    Huey Romans [facility] Clinical Pharmacist Assistant [phone]

## 2020-09-12 ENCOUNTER — Telehealth: Payer: Medicare Other

## 2020-09-12 ENCOUNTER — Telehealth: Payer: Self-pay

## 2020-09-12 NOTE — Progress Notes (Deleted)
Chronic Care Management Pharmacy Note  09/12/2020 Name:  Jacqueline Bell MRN:  [id number] DOB:  06/14/1950  Summary: ***  Recommendations/Changes made from today's visit: ***  Plan: ***   Subjective: Jacqueline Bell is an 70 y.o. year old female who is a primary patient of [name], [place].  The CCM team was consulted for assistance with disease management and care coordination needs.    {CCMTELEPHONEFACETOFACE:21091510} for {CCMINITIALFOLLOWUPCHOICE:21091511} in response to provider referral for pharmacy case management and/or care coordination services.   Consent to Services:  {CCMCONSENTOPTIONS:25074}  Patient Care Team: [name], FNP as PCP - General (General Practice) Warden Fillers, MD as Consulting Physician (Ophthalmology) Lynne Logan, RN as Case Manager  Recent office visits: ***  Recent consult visits: [facility] visits: {Hospital DC Yes/No:25215}   Objective:  Lab Results  Component Value Date   CREATININE 0.81 08/27/2020   BUN 11 08/27/2020   GFRNONAA 84 02/22/2020   GFRAA 96 02/22/2020   NA 138 08/27/2020   K 4.2 08/27/2020   CALCIUM 8.9 08/27/2020   CO2 24 08/27/2020   GLUCOSE 140 (H) 08/27/2020    Lab Results  Component Value Date/Time   HGBA1C 7.6 (H) 08/27/2020 12:56 PM   HGBA1C 8.1 (H) 05/23/2020 01:01 PM   MICROALBUR 10 05/17/2019 12:47 PM   MICROALBUR 30 05/05/2018 01:12 PM    Last diabetic Eye exam:  Lab Results  Component Value Date/Time   HMDIABEYEEXA Retinopathy (A) 06/09/2017 12:00 AM    Last diabetic Foot exam: No results found for: HMDIABFOOTEX   Lab Results  Component Value Date   CHOL 225 (H) 08/27/2020   HDL 54 08/27/2020   LDLCALC 151 (H) 08/27/2020   TRIG 110 08/27/2020   CHOLHDL 4.2 08/27/2020    Hepatic Function Latest Ref Rng & Units 08/27/2020 05/23/2020 02/22/2020  Total Protein 6.0 - 8.5 g/dL 7.6 7.7 7.2  Albumin 3.8 - 4.8 g/dL 4.6 4.7 4.2  AST 0 - 40 IU/L '18 21 24  ' ALT 0 - 32 IU/L  '14 13 16  ' Alk Phosphatase 44 - 121 IU/L 59 48 47  Total Bilirubin 0.0 - 1.2 mg/dL 0.3 0.6 0.2    Lab Results  Component Value Date/Time   TSH 0.482 05/05/2018 02:14 PM    CBC Latest Ref Rng & Units 08/27/2020 04/21/2008  WBC 3.4 - 10.8 x10E3/uL 4.0 4.2  Hemoglobin 11.1 - 15.9 g/dL 12.9 12.4  Hematocrit 34.0 - 46.6 % 38.2 36.6  Platelets 150 - 450 x10E3/uL 275 236    Lab Results  Component Value Date/Time   VD25OH 46.6 11/23/2019 02:26 PM   VD25OH 86.3 05/17/2019 02:39 PM    Clinical ASCVD: {YES/NO:21197} The 10-year ASCVD risk score Mikey Bussing DC Jr., et al., 2013) is: 31.8%   Values used to calculate the score:     Age: 62 years     Sex: Female     Is Non-Hispanic African American: Yes     Diabetic: Yes     Tobacco smoker: No     Systolic Blood Pressure: 491 mmHg     Is BP treated: Yes     HDL Cholesterol: 54 mg/dL     Total Cholesterol: 225 mg/dL    Depression screen [facility] 2/9 05/23/2020 05/17/2019 01/23/2019  Decreased Interest 0 0 0  Down, Depressed, Hopeless 0 0 0  PHQ - 2 Score 0 0 0  Altered sleeping - 3 -  Tired, decreased energy - 0 -  Change in appetite - 0 -  Feeling bad or failure about yourself  - 0 -  Trouble concentrating - 0 -  Moving slowly or fidgety/restless - 0 -  Suicidal thoughts - 0 -  PHQ-9 Score - 3 -  Difficult doing work/chores - Not difficult at all -     ***Other: (CHADS2VASc if Afib, MMRC or CAT for COPD, ACT, DEXA)  Social History   Tobacco Use  Smoking Status Never  Smokeless Tobacco Never   BP Readings from Last 3 Encounters:  08/27/20 (!) 142/78  06/12/20 (!) 142/68  05/23/20 138/80   Pulse Readings from Last 3 Encounters:  08/27/20 78  05/23/20 81  02/22/20 76   Wt Readings from Last 3 Encounters:  08/27/20 191 lb 3.2 oz (86.7 kg)  06/12/20 187 lb (84.8 kg)  05/23/20 191 lb 9.6 oz (86.9 kg)   BMI Readings from Last 3 Encounters:  08/27/20 29.95 kg/m  06/12/20 28.86 kg/m  05/23/20 31.69 kg/m     Assessment/Interventions: Review of patient past medical history, allergies, medications, health status, including review of consultants reports, laboratory and other test data, was performed as part of comprehensive evaluation and provision of chronic care management services.   SDOH:  (Social Determinants of Health) assessments and interventions performed: {yes/no:20286}  SDOH Screenings   Alcohol Screen: Not on file  Depression (PHQ2-9): Low Risk    PHQ-2 Score: 0  Financial Resource Strain: Low Risk    Difficulty of Paying Living Expenses: Not hard at all  Food Insecurity: Not on file  Housing: Not on file  Physical Activity: Not on file  Social Connections: Not on file  Stress: Not on file  Tobacco Use: Low Risk    Smoking Tobacco Use: Never   Smokeless Tobacco Use: Never  Transportation Needs: Not on file    [place]  Allergies  Allergen Reactions   Iodine Hives   Shellfish Allergy Hives    Medications Reviewed Today     Reviewed by Lynne Logan, RN (Registered Nurse) on 09/05/20 at [place] List Status: <None>   Medication Order Taking? Sig Documenting Provider Last Dose Status Informant  cholecalciferol (VITAMIN D) 1000 units tablet [id number] No Take 1,000 Units by mouth daily. [provider] Taking Active   dapagliflozin propanediol (FARXIGA) 5 MG TABS tablet [id number] No Take 1 tablet (5 mg total) by mouth daily before breakfast. [name], FNP Taking Active   EPINEPHrine 0.3 mg/0.3 mL IJ SOAJ injection [id number] No  [provider] Taking Active   fluticasone (FLONASE) 50 MCG/ACT nasal spray [id number] No USE 1 SPRAY IN EACH NOSTRIL DAILY Bobbitt, [place] Muta, MD Taking Active   insulin aspart (NOVOLOG FLEXPEN) 100 UNIT/ML FlexPen [id number] No Per SS: < 70 = 0; 70-90 = 4 units; 91-130 = 6 units; 131-150 = 7 units; 151-200 = 8 units; 201-250 = 9 units; 251-300 = 10 units; 301-350 = 11 units; 351-400 = 12 units; 401-450 = 13 units;  > 450 = 14 units. [name], FNP Taking Active   insulin degludec (TRESIBA FLEXTOUCH) 100 UNIT/ML FlexTouch Pen [id number] No Inject 20 Units into the skin daily. [name], FNP Taking Active   loratadine (CLARITIN) 10 MG tablet [id number] No Take 1 tablet (10 mg total) by mouth daily. [name], FNP Taking Active   magnesium oxide (MAG-OX) 400 MG tablet [id number] No Take 400 mg by mouth daily. [provider] Taking Active Self  meloxicam (MOBIC) 15 MG tablet [id number] No TAKE 1 TABLET AS NEEDED [name], FNP  Taking Active   Multiple Vitamin (MULTIVITAMIN) tablet [id number] No Take 1 tablet by mouth daily. [provider] Taking Active   Olopatadine HCl (PAZEO) 0.7 % SOLN [id number] No Place 1 drop into both eyes daily. Bobbitt, [place] Muta, MD Taking Active            Med Note (LITTLE, Simeon Craft May 19, 2018 11:45 AM) Patient is taking as needed.   Omega-3 Fatty Acids (FISH OIL PO) [id number] No Take 2 capsules by mouth daily.  [provider] Taking Active   OZEMPIC, 1 MG/DOSE, 2 MG/1.5ML SOPN [id number] No INJECT 1 MG UNDER THE SKIN ONCE WEEKLY [name], FNP Taking Active   POTASSIUM BICARBONATE PO [id number] No Take 1 tablet by mouth. Patient takes 2 tablets daily [provider] Taking Active   pravastatin (PRAVACHOL) 40 MG tablet [id number] No Take 1 tablet (40 mg total) by mouth every evening. [name], FNP Taking Active   telmisartan (MICARDIS) 20 MG tablet [id number] No Take 1 tablet (20 mg total) by mouth daily. 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   Allergic reaction 04/04/2018   Cramp and spasm 02/24/2018   History of food allergy 02/24/2018   Type 2 diabetes mellitus without complication, with long-term  current use of insulin ([place]) 02/24/2018   Insomnia 02/24/2018    Immunization History  Administered Date(s) Administered   Fluad Quad(high Dose 65+) 11/23/2019   Influenza, High Dose Seasonal PF 11/28/2018   Influenza-Unspecified 11/18/2017   PFIZER(Purple Top)SARS-COV-2 Vaccination 04/07/2019, 05/01/2019, 01/17/2020   PNEUMOCOCCAL CONJUGATE-20 08/28/2020    Conditions to be addressed/monitored:  {USCCMDZASSESSMENTOPTIONS:23563}  There are no care plans that you recently modified to display for this patient.    Medication Assistance: {MEDASSISTANCEINFO:25044}  Compliance/Adherence/Medication fill history: Care Gaps: ***  Star-Rating Drugs: ***  Patient's preferred pharmacy is:  [facility] - [address], [place] [address] Phone: [phone] Fax: [phone]  [facility] [phone] - [place], [place] - [address] [place] [address] Phone: [phone] Fax: [phone]  [facility] [place], [place] [address] [place] [address] Phone: [phone] Fax: [phone]  Uses pill box? {Yes or If no, why not?:20788} Pt endorses ***% compliance  We discussed: {Pharmacy options:24294} Patient decided to: {US Pharmacy Plan:23885}  Care Plan and Follow Up Patient Decision:  {FOLLOWUP:24991}  Plan: {CM FOLLOW UP IXVE:55015}  *** Current Barriers:  {pharmacybarriers:24917}  Pharmacist Clinical Goal(s):  Patient will {PHARMACYGOALCHOICES:24921} through collaboration with PharmD and provider.   Interventions: 1:1 collaboration with [name], FNP regarding development and update of comprehensive plan of care as evidenced by provider attestation and co-signature Inter-disciplinary care team collaboration (see longitudinal plan of care) Comprehensive medication review  performed; medication list updated in electronic medical record  {CCM PHARMD DISEASE STATES:25130}  Patient Goals/Self-Care Activities Patient will:  - {pharmacypatientgoals:24919}  Follow Up Plan: {CM FOLLOW UP AEWY:57493}

## 2020-09-12 NOTE — Telephone Encounter (Signed)
  Care Management   Follow Up Note   09/12/2020 Name: Jacqueline Bell MRN: [id number] DOB: [birth year]   Referred by: Arnette Felts, FNP Reason for referral : No chief complaint on file.   An unsuccessful telephone outreach was attempted today. The patient was referred to the case management team for assistance with care management and care coordination.   Follow Up Plan: The patient has been provided with contact information for the care management team and has been advised to call with any health related questions or concerns.   Cherylin Mylar, PharmD Clinical Pharmacist [facility] [phone]

## 2020-09-13 NOTE — Chronic Care Management (AMB) (Signed)
Chronic Care Management   CCM RN Visit Note  09/05/2020 Name: Jacqueline Bell MRN: [id number] DOB: 1951/02/08  Subjective: Jacqueline Bell is a 70 y.o. year old female who is a primary care patient of [name], [place]. The care management team was consulted for assistance with disease management and care coordination needs.    Engaged with patient by telephone for follow up visit in response to provider referral for case management and/or care coordination services.   Consent to Services:  The patient was given information about Chronic Care Management services, agreed to services, and gave verbal consent prior to initiation of services.  Please see initial visit note for detailed documentation.   Patient agreed to services and verbal consent obtained.   Assessment: Review of patient past medical history, allergies, medications, health status, including review of consultants reports, laboratory and other test data, was performed as part of comprehensive evaluation and provision of chronic care management services.   SDOH (Social Determinants of Health) assessments and interventions performed:  Yes, no acute challenges   CCM Care Plan  Allergies  Allergen Reactions   Iodine Hives   Shellfish Allergy Hives    Outpatient Encounter Medications as of 09/05/2020  Medication Sig Note   cholecalciferol (VITAMIN D) 1000 units tablet Take 1,000 Units by mouth daily.    dapagliflozin propanediol (FARXIGA) 5 MG TABS tablet Take 1 tablet (5 mg total) by mouth daily before breakfast.    EPINEPHrine 0.3 mg/0.3 mL IJ SOAJ injection     fluticasone (FLONASE) 50 MCG/ACT nasal spray USE 1 SPRAY IN EACH NOSTRIL DAILY    insulin aspart (NOVOLOG FLEXPEN) 100 UNIT/ML FlexPen Per SS: < 70 = 0; 70-90 = 4 units; 91-130 = 6 units; 131-150 = 7 units; 151-200 = 8 units; 201-250 = 9 units; 251-300 = 10 units; 301-350 = 11 units; 351-400 = 12 units; 401-450 = 13 units; > 450 = 14 units.    insulin degludec  (TRESIBA FLEXTOUCH) 100 UNIT/ML FlexTouch Pen Inject 20 Units into the skin daily.    loratadine (CLARITIN) 10 MG tablet Take 1 tablet (10 mg total) by mouth daily.    magnesium oxide (MAG-OX) 400 MG tablet Take 400 mg by mouth daily.    meloxicam (MOBIC) 15 MG tablet TAKE 1 TABLET AS NEEDED    Multiple Vitamin (MULTIVITAMIN) tablet Take 1 tablet by mouth daily.    Olopatadine HCl (PAZEO) 0.7 % SOLN Place 1 drop into both eyes daily. 05/19/2018: Patient is taking as needed.    Omega-3 Fatty Acids (FISH OIL PO) Take 2 capsules by mouth daily.     OZEMPIC, 1 MG/DOSE, 2 MG/1.5ML SOPN INJECT 1 MG UNDER THE SKIN ONCE WEEKLY    POTASSIUM BICARBONATE PO Take 1 tablet by mouth. Patient takes 2 tablets daily    pravastatin (PRAVACHOL) 40 MG tablet Take 1 tablet (40 mg total) by mouth every evening.    telmisartan (MICARDIS) 20 MG tablet Take 1 tablet (20 mg total) by mouth daily.    No facility-administered encounter medications on file as of 09/05/2020.    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   Allergic reaction 04/04/2018   Cramp and spasm 02/24/2018   History of food allergy 02/24/2018   Type 2 diabetes mellitus without complication, with long-term current use of insulin ([place]) 02/24/2018   Insomnia  02/24/2018    Conditions to be addressed/monitored: Type 2 Diabetes, Hypertension, Insomnia  Care Plan : Diabetes Type 2 (Adult)  Updates made by Lynne Logan, RN since 09/05/2020 12:00 AM     Problem: Glycemic Management (Diabetes, Type 2)   Priority: High     Long-Range Goal: Glycemic Management Optimized   Start Date: 09/05/2020  Expected End Date: 09/05/2021  This Visit's Progress: On track  Priority: High  Note:   Objective:  Lab Results  Component Value Date   HGBA1C 7.6 (H) 08/27/2020   Lab Results   Component Value Date   CREATININE 0.81 08/27/2020   CREATININE 0.82 05/23/2020   CREATININE 0.74 02/22/2020   Lab Results  Component Value Date   EGFR 79 08/27/2020   Current Barriers:  Knowledge Deficits related to basic Diabetes pathophysiology and self care/management Knowledge Deficits related to medications used for management of diabetes Case Manager Clinical Goal(s):  patient will demonstrate improved adherence to prescribed treatment plan for diabetes self care/management as evidenced by: daily monitoring and recording of CBG  adherence to ADA/ carb modified diet exercise 5 days/week adherence to prescribed medication regimen contacting provider for new or worsened symptoms or questions Interventions:  09/05/20 completed successful outbound call with patient  Collaboration with [name], [place] regarding development and update of comprehensive plan of care as evidenced by provider attestation and co-signature Inter-disciplinary care team collaboration (see longitudinal plan of care) Provided education to patient about basic DM disease process Review of patient status, including review of consultant's reports, relevant laboratory and other test results, and medications completed. Reviewed medications with patient and discussed importance of medication adherence Educated patient on dietary and exercise recommendations; daily glycemic control FBS 80-130, <180 after meals;15'15' rule Advised patient, providing education and rationale, to check cbg daily before meals and at bedtime and record, calling the CCM team and or PCP for findings outside established parameters Discussed plans with patient for ongoing care management follow up and provided patient with direct contact information for care management team Self-Care Activities Self administers oral medications as prescribed Self administers injectable DM medication (Ozempic) as prescribed Attends all scheduled provider  appointments Checks blood sugars as prescribed and utilize hyper and hypoglycemia protocol as needed Adheres to prescribed ADA/carb modified Patient Goals: - check blood sugar at prescribed times - check blood sugar if I feel it is too high or too low - enter blood sugar readings and medication or insulin into daily log - take the blood sugar log to all doctor visits - take the blood sugar meter to all doctor visits - manage portion size - keep appointment with eye doctor - schedule appointment with eye doctor - check feet daily for cuts, sores or redness - do heel pump exercise 2 to 3 times each day - keep feet up while sitting - trim toenails straight across - wash and dry feet carefully every day - wear comfortable, cotton socks - wear comfortable, well-fitting shoes  Follow Up Plan: Telephone follow up appointment with care management team member scheduled for: 12/06/20    Care Plan : Elevated Cholesterol  Updates made by Lynne Logan, RN since 09/05/2020 12:00 AM     Problem: Elevated Cholesterol   Priority: High     Long-Range Goal: Elevated Cholesterol - complications prevented or minimized   Start Date: 09/05/2020  Expected End Date: 09/05/2021  This Visit's Progress: On track  Priority: High  Note:   Lipid Panel     Component Value  Date/Time   CHOL 225 (H) 08/27/2020 1256   TRIG 110 08/27/2020 1256   HDL 54 08/27/2020 1256   CHOLHDL 4.2 08/27/2020 1256   LDLCALC 151 (H) 08/27/2020 1256   LABVLDL 20 08/27/2020 1256    Current Barriers:  Ineffective Self Health Maintenance  Clinical Goal(s):  Collaboration with [name], FNP regarding development and update of comprehensive plan of care as evidenced by provider attestation and co-signature Inter-disciplinary care team collaboration (see longitudinal plan of care) patient will work with care management team to address care coordination and chronic disease management needs related to Disease  Management Educational Needs Care Coordination Medication Management and Education Psychosocial Support   Interventions:  09/05/20 completed successful outbound call with patient  Evaluation of current treatment plan related to  Elevated Cholesterol , self-management and patient's adherence to plan as established by provider. Collaboration with [name], FNP regarding development and update of comprehensive plan of care as evidenced by provider attestation       and co-signature Inter-disciplinary care team collaboration (see longitudinal plan of care) Provided education to patient about basic disease process related to Elevated Cholesterol  Review of patient status, including review of consultant's reports, relevant laboratory and other test results, and medications completed. Reviewed medications with patient and discussed importance of medication adherence Educated patient on dietary and exercise recommendations  Mailed printed educational material related to Lowering Cholesterol  Discussed plans with patient for ongoing care management follow up and provided patient with direct contact information for care management team Self Care Activities:  Self administers medications as prescribed Attends all scheduled provider appointments Calls pharmacy for medication refills Calls provider office for new concerns or questions Patient Goals: - Adhere to dietary and exercise recommendations   Follow Up Plan: Telephone follow up appointment with care management team member scheduled for: 12/06/20    Plan:Telephone follow up appointment with care management team member scheduled for:  12/06/20  Barb Merino, RN, BSN, CCM Care Management Coordinator [place] [facility]  Direct Phone: [phone]

## 2020-09-13 NOTE — Patient Instructions (Signed)
Goals Addressed       Other     Elevated Cholesterol - complications prevented or minimized   On track     Timeframe:  Long-Range Goal Priority:  High Start Date:  09/05/20                          Expected End Date:  09/05/21  Next Scheduled follow up date: 12/06/20             Self Care Activities:  Self administers medications as prescribed Attends all scheduled provider appointments Calls pharmacy for medication refills Calls provider office for new concerns or questions Patient Goals: - Adhere to dietary and exercise recommendations                Monitor and Manage My Blood Sugar-Diabetes Type 2   On track     Timeframe:  Long-Range Goal Priority:  High Start Date:  09/05/20                           Expected End Date:  09/05/21                     Follow Up Date 12/06/20    - check blood sugar at prescribed times - check blood sugar if I feel it is too high or too low - enter blood sugar readings and medication or insulin into daily log - take the blood sugar log to all doctor visits - take the blood sugar meter to all doctor visits    Why is this important?   Checking your blood sugar at home helps to keep it from getting very high or very low.  Writing the results in a diary or log helps the doctor know how to care for you.  Your blood sugar log should have the time, date and the results.  Also, write down the amount of insulin or other medicine that you take.  Other information, like what you ate, exercise done and how you were feeling, will also be helpful.     Notes:        Obtain Eye Exam-Diabetes Type 2   On track     Timeframe:  Long-Range Goal Priority:  Medium Start Date:  09/05/20                           Expected End Date:  09/05/21                      Follow Up Date 12/06/20    - keep appointment with eye doctor - schedule appointment with eye doctor    Why is this important?   Eye check-ups are important when you have diabetes.  Vision loss  can be prevented.    Notes:        Perform Foot Care-Diabetes Type 2   On track     Timeframe:  Long-Range Goal Priority:  Medium Start Date:  09/05/20                           Expected End Date:  09/05/21                     Follow Up Date 12/06/20    - check feet daily for cuts, sores or redness -  do heel pump exercise 2 to 3 times each day - keep feet up while sitting - trim toenails straight across - wash and dry feet carefully every day - wear comfortable, cotton socks - wear comfortable, well-fitting shoes    Why is this important?   Good foot care is very important when you have diabetes.  There are many things you can do to keep your feet healthy and catch a problem early.    Notes:

## 2020-09-23 ENCOUNTER — Other Ambulatory Visit: Payer: Self-pay | Admitting: Nurse Practitioner

## 2020-09-23 DIAGNOSIS — I10 Essential (primary) hypertension: Secondary | ICD-10-CM

## 2020-09-23 DIAGNOSIS — Z794 Long term (current) use of insulin: Secondary | ICD-10-CM

## 2020-09-23 DIAGNOSIS — J3089 Other allergic rhinitis: Secondary | ICD-10-CM

## 2020-09-23 DIAGNOSIS — E119 Type 2 diabetes mellitus without complications: Secondary | ICD-10-CM

## 2020-09-24 ENCOUNTER — Telehealth: Payer: Self-pay

## 2020-09-24 NOTE — Telephone Encounter (Signed)
  Care Management   Follow Up Note   09/24/2020 Name: Jacqueline Bell MRN: [id number] DOB: Dec 01, 1950   Referred by: Arnette Felts, FNP Reason for referral : Chronic Care Management (RNCM Follow up )  Printed and mailed educational material related to "My Cholesterol Care Guide" as discussed during 09/05/20 encounter with patient.   Follow Up Plan: Telephone follow up appointment with care management team member scheduled for: 12/06/20  Delsa Sale, RN, BSN, CCM Care Management Coordinator [facility]  Direct Phone: [phone]

## 2020-09-25 ENCOUNTER — Ambulatory Visit (INDEPENDENT_AMBULATORY_CARE_PROVIDER_SITE_OTHER): Payer: Medicare Other

## 2020-09-25 VITALS — Ht 68.0 in | Wt 183.0 lb

## 2020-09-25 DIAGNOSIS — Z0000 Encounter for general adult medical examination without abnormal findings: Secondary | ICD-10-CM | POA: Diagnosis not present

## 2020-09-25 NOTE — Progress Notes (Signed)
I connected with  [name] today via telehealth video enabled device and verified that I am speaking with the correct person using two identifiers.   Location: Patient: home Provider: work  Persons participating in virtual visit: [name],[name] Freida Busman LPN  I discussed the limitations, risks, security and privacy concerns of performing an evaluation and management service by video and the availability of in person appointments. The patient expressed understanding and agreed to proceed.   Some vital signs may be absent or patient reported.     Subjective:   Jacqueline Bell is a 70 y.o. female who presents for Medicare Annual (Subsequent) preventive examination.  Review of Systems     Cardiac Risk Factors include: advanced age (>57men, >26 women);diabetes mellitus;hypertension;sedentary lifestyle     Objective:    Today's Vitals   09/25/20 1042  Weight: 183 lb (83 kg)  Height: 5\' 8"  (1.727 m)   Body mass index is 27.83 kg/m.  Advanced Directives 09/25/2020 05/17/2019 05/05/2018 10/05/2016  Does Patient Have a Medical Advance Directive? Yes Yes No No  Type of 10/07/2016 of [place];Living will Living will - -  Copy of Healthcare Power of Attorney in Chart? No - copy requested - - -  Would patient like information on creating a medical advance directive? - - Yes (MAU/Ambulatory/Procedural Areas - Information given) -    Current Medications (verified) Outpatient Encounter Medications as of 09/25/2020  Medication Sig   cholecalciferol (VITAMIN D) 1000 units tablet Take 1,000 Units by mouth daily.   dapagliflozin propanediol (FARXIGA) 5 MG TABS tablet Take 1 tablet (5 mg total) by mouth daily before breakfast.   EPINEPHrine 0.3 mg/0.3 mL IJ SOAJ injection    fluticasone (FLONASE) 50 MCG/ACT nasal spray USE 1 SPRAY IN EACH NOSTRIL DAILY   insulin aspart (NOVOLOG FLEXPEN) 100 UNIT/ML FlexPen Per SS: < 70 = 0; 70-90 = 4 units; 91-130 = 6 units;  131-150 = 7 units; 151-200 = 8 units; 201-250 = 9 units; 251-300 = 10 units; 301-350 = 11 units; 351-400 = 12 units; 401-450 = 13 units; > 450 = 14 units.   insulin degludec (TRESIBA FLEXTOUCH) 100 UNIT/ML FlexTouch Pen Inject 20 Units into the skin daily.   loratadine (CLARITIN) 10 MG tablet TAKE 1 TABLET DAILY   magnesium oxide (MAG-OX) 400 MG tablet Take 400 mg by mouth daily.   meloxicam (MOBIC) 15 MG tablet TAKE 1 TABLET AS NEEDED   Multiple Vitamin (MULTIVITAMIN) tablet Take 1 tablet by mouth daily.   Olopatadine HCl (PAZEO) 0.7 % SOLN Place 1 drop into both eyes daily.   Omega-3 Fatty Acids (FISH OIL PO) Take 2 capsules by mouth daily.    OZEMPIC, 1 MG/DOSE, 2 MG/1.5ML SOPN INJECT 1 MG UNDER THE SKIN ONCE WEEKLY   POTASSIUM BICARBONATE PO Take 1 tablet by mouth. Patient takes 2 tablets daily   pravastatin (PRAVACHOL) 40 MG tablet TAKE 1 TABLET EVERY EVENING   telmisartan (MICARDIS) 20 MG tablet TAKE 1 TABLET DAILY   No facility-administered encounter medications on file as of 09/25/2020.    Allergies (verified) Iodine and Shellfish allergy   History: Past Medical History:  Diagnosis Date   Diabetes mellitus without complication (HCC)    History reviewed. No pertinent surgical history. History reviewed. No pertinent family history. Social History   Socioeconomic History   Marital status: Married    Spouse name: Not on file   Number of children: 4   Years of education: Not on file  Highest education level: Associate degree: academic program  Occupational History   Not on file  Tobacco Use   Smoking status: Never   Smokeless tobacco: Never  Vaping Use   Vaping Use: Never used  Substance and Sexual Activity   Alcohol use: No   Drug use: No   Sexual activity: Yes  Other Topics Concern   Not on file  Social History Narrative   Patient reports no social determinants of health during today's screening. 3/12   Social Determinants of Health   Financial Resource  Strain: Low Risk    Difficulty of Paying Living Expenses: Not hard at all  Food Insecurity: No Food Insecurity   Worried About Programme researcher, broadcasting/film/video in the Last Year: Never true   Ran Out of Food in the Last Year: Never true  Transportation Needs: No Transportation Needs   Lack of Transportation (Medical): No   Lack of Transportation (Non-Medical): No  Physical Activity: Insufficiently Active   Days of Exercise per Week: 3 days   Minutes of Exercise per Session: 20 min  Stress: No Stress Concern Present   Feeling of Stress : Not at all  Social Connections: Not on file    Tobacco Counseling Counseling given: Not Answered   Clinical Intake:  Pre-visit preparation completed: Yes  Pain : No/denies pain     Nutritional Status: BMI 25 -29 Overweight Nutritional Risks: Nausea/ vomitting/ diarrhea (diarrhea last week, resolved) Diabetes: Yes  How often do you need to have someone help you when you read instructions, pamphlets, or other written materials from your doctor or pharmacy?: 1 - Never What is the last grade level you completed in school?: associates degree  Diabetic? Yes Nutrition Risk Assessment:  Has the patient had any N/V/D within the last 2 months?  Yes  Does the patient have any non-healing wounds?  No  Has the patient had any unintentional weight loss or weight gain?  No   Diabetes:  Is the patient diabetic?  Yes  If diabetic, was a CBG obtained today?  No  Did the patient bring in their glucometer from home?  No  How often do you monitor your CBG's? 5-6 daily.   Financial Strains and Diabetes Management:  Are you having any financial strains with the device, your supplies or your medication? No .  Does the patient want to be seen by Chronic Care Management for management of their diabetes?  No  Would the patient like to be referred to a Nutritionist or for Diabetic Management?  No   Diabetic Exams:  Diabetic Eye Exam: Overdue for diabetic eye exam. Pt  has been advised about the importance in completing this exam. Patient advised to call and schedule an eye exam. Diabetic Foot Exam: Completed 02/22/2020   Interpreter Needed?: No  Information entered by :: NAllen LPN   Activities of Daily Living In your present state of health, do you have any difficulty performing the following activities: 09/25/2020 05/23/2020  Hearing? N N  Vision? N N  Difficulty concentrating or making decisions? N N  Walking or climbing stairs? N N  Dressing or bathing? N N  Doing errands, shopping? N N  Preparing Food and eating ? N -  Using the Toilet? N -  In the past six months, have you accidently leaked urine? N -  Do you have problems with loss of bowel control? N -  Managing your Medications? N -  Managing your Finances? N -  Housekeeping or managing your  Housekeeping? N -  Some recent data might be hidden    Patient Care Team: Arnette FeltsMoore, Janece, FNP as PCP - General (General Practice) Sallye LatGroat, Christopher, MD as Consulting Physician (Ophthalmology) Clarene DukeLittle, Karma LewAngel L, RN as Case Manager  Indicate any recent Medical Services you may have received from other than [facility] providers in the past year (date may be approximate).     Assessment:   This is a routine wellness examination for [place].  Hearing/Vision screen Vision Screening - Comments:: Regular eye exams, Dr. Dione BoozeGroat, Dr. Luciana Axeankin  Dietary issues and exercise activities discussed: Current Exercise Habits: Home exercise routine, Type of exercise: walking, Time (Minutes): 20, Frequency (Times/Week): 3, Weekly Exercise (Minutes/Week): 60   Goals Addressed             This Visit's Progress    Patient Stated       09/25/2020, wants to get off medications especially insulin       Depression Screen PHQ 2/9 Scores 09/25/2020 05/23/2020 05/17/2019 01/23/2019 11/21/2018 09/19/2018 05/05/2018  PHQ - 2 Score 0 0 0 0 0 0 0  PHQ- 9 Score - - 3 - - - 0    Fall Risk Fall Risk  09/25/2020 05/23/2020  05/17/2019 01/23/2019 11/21/2018  Falls in the past year? 0 0 0 0 0  Risk for fall due to : Medication side effect - Medication side effect - -  Follow up Falls evaluation completed;Education provided;Falls prevention discussed - Falls evaluation completed;Education provided;Falls prevention discussed - -    FALL RISK PREVENTION PERTAINING TO THE HOME:  Any stairs in or around the home? Yes  If so, are there any without handrails? No  Home free of loose throw rugs in walkways, pet beds, electrical cords, etc? Yes  Adequate lighting in your home to reduce risk of falls? Yes   ASSISTIVE DEVICES UTILIZED TO PREVENT FALLS:  Life alert? No  Use of a cane, walker or w/c? No  Grab bars in the bathroom? No  Shower chair or bench in shower? No  Elevated toilet seat or a handicapped toilet? No   TIMED UP AND GO:  Was the test performed? No .      Cognitive Function:     6CIT Screen 09/25/2020 05/17/2019 05/05/2018  What Year? 0 points 0 points 0 points  What month? 0 points 0 points 0 points  What time? 0 points 0 points 0 points  Count back from 20 0 points 0 points 0 points  Months in reverse 0 points 0 points 0 points  Repeat phrase 4 points 2 points 0 points  Total Score 4 2 0    Immunizations Immunization History  Administered Date(s) Administered   Fluad Quad(high Dose 65+) 11/23/2019   Influenza, High Dose Seasonal PF 11/28/2018   Influenza-Unspecified 11/18/2017   PFIZER(Purple Top)SARS-COV-2 Vaccination 04/07/2019, 05/01/2019, 01/17/2020   PNEUMOCOCCAL CONJUGATE-20 08/28/2020    TDAP status: Up to date  Flu Vaccine status: Up to date  Pneumococcal vaccine status: Up to date  Covid-19 vaccine status: Completed vaccines  Qualifies for Shingles Vaccine? Yes   Zostavax completed No   Shingrix Completed?: No.    Education has been provided regarding the importance of this vaccine. Patient has been advised to call insurance company to determine out of pocket expense  if they have not yet received this vaccine. Advised may also receive vaccine at local pharmacy or Health Dept. Verbalized acceptance and understanding.  Screening Tests Health Maintenance  Topic Date Due   Zoster Vaccines- Shingrix (1  of 2) Never done   PNA vac Low Risk Adult (1 of 2 - PCV13) 02/09/2017   OPHTHALMOLOGY EXAM  06/10/2018   COVID-19 Vaccine (4 - Booster for Pfizer series) 04/18/2020   INFLUENZA VACCINE  10/07/2020   FOOT EXAM  02/21/2021   HEMOGLOBIN A1C  02/26/2021   MAMMOGRAM  06/24/2021   TETANUS/TDAP  10/27/2022   COLONOSCOPY (Pts 45-85yrs Insurance coverage will need to be confirmed)  09/18/2028   DEXA SCAN  Completed   Hepatitis C Screening  Completed   HPV VACCINES  Aged Out    Health Maintenance  Health Maintenance Due  Topic Date Due   Zoster Vaccines- Shingrix (1 of 2) Never done   PNA vac Low Risk Adult (1 of 2 - PCV13) 02/09/2017   OPHTHALMOLOGY EXAM  06/10/2018   COVID-19 Vaccine (4 - Booster for Pfizer series) 04/18/2020    Colorectal cancer screening: patient to reschedule  Mammogram status: Completed 06/24/2020. Repeat every year  Bone Density status: Completed 10/26/2012.   Lung Cancer Screening: (Low Dose CT Chest recommended if Age 32-80 years, 30 pack-year currently smoking OR have quit w/in 15years.) does not qualify.   Lung Cancer Screening Referral: no  Additional Screening:  Hepatitis C Screening: does qualify; Completed 03/28/2012  Vision Screening: Recommended annual ophthalmology exams for early detection of glaucoma and other disorders of the eye. Is the patient up to date with their annual eye exam?  No  Who is the provider or what is the name of the office in which the patient attends annual eye exams? Dr. Dione Booze ,  If pt is not established with a provider, would they like to be referred to a provider to establish care? No .   Dental Screening: Recommended annual dental exams for proper oral hygiene  Community Resource  Referral / Chronic Care Management: CRR required this visit?  No   CCM required this visit?  No      Plan:     I have personally reviewed and noted the following in the patient's chart:   Medical and social history Use of alcohol, tobacco or illicit drugs  Current medications and supplements including opioid prescriptions.  Functional ability and status Nutritional status Physical activity Advanced directives List of other physicians Hospitalizations, surgeries, and ER visits in previous 12 months Vitals Screenings to include cognitive, depression, and falls Referrals and appointments  In addition, I have reviewed and discussed with patient certain preventive protocols, quality metrics, and best practice recommendations. A written personalized care plan for preventive services as well as general preventive health recommendations were provided to patient.     Barb Merino, LPN   1/61/0960   Nurse Notes:

## 2020-09-25 NOTE — Patient Instructions (Signed)
Jacqueline Bell , Thank you for taking time to come for your Medicare Wellness Visit. I appreciate your ongoing commitment to your health goals. Please review the following plan we discussed and let me know if I can assist you in the future.   Screening recommendations/referrals: Colonoscopy: patient to schedule Mammogram: completed 06/24/2020 Bone Density: completed 10/26/2012 Recommended yearly ophthalmology/optometry visit for glaucoma screening and checkup Recommended yearly dental visit for hygiene and checkup  Vaccinations: Influenza vaccine: completed 11/23/2019, due 10/07/2020 Pneumococcal vaccine: completed 08/28/2020 Tdap vaccine: completed 10/26/2012, due 10/27/2022 Shingles vaccine: discussed   Covid-19: 01/17/2020, 05/01/2019, 04/07/2019  Advanced directives: Please bring a copy of your POA (Power of Attorney) and/or Living Will to your next appointment.   Conditions/risks identified: none  Next appointment: Follow up in one year for your annual wellness visit    Preventive Care 65 Years and Older, Female Preventive care refers to lifestyle choices and visits with your health care provider that can promote health and wellness. What does preventive care include? A yearly physical exam. This is also called an annual well check. Dental exams once or twice a year. Routine eye exams. Ask your health care provider how often you should have your eyes checked. Personal lifestyle choices, including: Daily care of your teeth and gums. Regular physical activity. Eating a healthy diet. Avoiding tobacco and drug use. Limiting alcohol use. Practicing safe sex. Taking low-dose aspirin every day. Taking vitamin and mineral supplements as recommended by your health care provider. What happens during an annual well check? The services and screenings done by your health care provider during your annual well check will depend on your age, overall health, lifestyle risk factors, and family history of  disease. Counseling  Your health care provider may ask you questions about your: Alcohol use. Tobacco use. Drug use. Emotional well-being. Home and relationship well-being. Sexual activity. Eating habits. History of falls. Memory and ability to understand (cognition). Work and work Astronomer. Reproductive health. Screening  You may have the following tests or measurements: Height, weight, and BMI. Blood pressure. Lipid and cholesterol levels. These may be checked every 5 years, or more frequently if you are over 103 years old. Skin check. Lung cancer screening. You may have this screening every year starting at age 32 if you have a 30-pack-year history of smoking and currently smoke or have quit within the past 15 years. Fecal occult blood test (FOBT) of the stool. You may have this test every year starting at age 60. Flexible sigmoidoscopy or colonoscopy. You may have a sigmoidoscopy every 5 years or a colonoscopy every 10 years starting at age 98. Hepatitis C blood test. Hepatitis B blood test. Sexually transmitted disease (STD) testing. Diabetes screening. This is done by checking your blood sugar (glucose) after you have not eaten for a while (fasting). You may have this done every 1-3 years. Bone density scan. This is done to screen for osteoporosis. You may have this done starting at age 44. Mammogram. This may be done every 1-2 years. Talk to your health care provider about how often you should have regular mammograms. Talk with your health care provider about your test results, treatment options, and if necessary, the need for more tests. Vaccines  Your health care provider may recommend certain vaccines, such as: Influenza vaccine. This is recommended every year. Tetanus, diphtheria, and acellular pertussis (Tdap, Td) vaccine. You may need a Td booster every 10 years. Zoster vaccine. You may need this after age 21. Pneumococcal 13-valent conjugate (PCV13)  vaccine. One  dose is recommended after age 34. Pneumococcal polysaccharide (PPSV23) vaccine. One dose is recommended after age 43. Talk to your health care provider about which screenings and vaccines you need and how often you need them. This information is not intended to replace advice given to you by your health care provider. Make sure you discuss any questions you have with your health care provider. Document Released: 03/22/2015 Document Revised: 11/13/2015 Document Reviewed: 12/25/2014 Elsevier Interactive Patient Education  2017 [place] Prevention in the Home Falls can cause injuries. They can happen to people of all ages. There are many things you can do to make your home safe and to help prevent falls. What can I do on the outside of my home? Regularly fix the edges of walkways and driveways and fix any cracks. Remove anything that might make you trip as you walk through a door, such as a raised step or threshold. Trim any bushes or trees on the path to your home. Use bright outdoor lighting. Clear any walking paths of anything that might make someone trip, such as rocks or tools. Regularly check to see if handrails are loose or broken. Make sure that both sides of any steps have handrails. Any raised decks and porches should have guardrails on the edges. Have any leaves, snow, or ice cleared regularly. Use sand or salt on walking paths during winter. Clean up any spills in your garage right away. This includes oil or grease spills. What can I do in the bathroom? Use night lights. Install grab bars by the toilet and in the tub and shower. Do not use towel bars as grab bars. Use non-skid mats or decals in the tub or shower. If you need to sit down in the shower, use a plastic, non-slip stool. Keep the floor dry. Clean up any water that spills on the floor as soon as it happens. Remove soap buildup in the tub or shower regularly. Attach bath mats securely with double-sided  non-slip rug tape. Do not have throw rugs and other things on the floor that can make you trip. What can I do in the bedroom? Use night lights. Make sure that you have a light by your bed that is easy to reach. Do not use any sheets or blankets that are too big for your bed. They should not hang down onto the floor. Have a firm chair that has side arms. You can use this for support while you get dressed. Do not have throw rugs and other things on the floor that can make you trip. What can I do in the kitchen? Clean up any spills right away. Avoid walking on wet floors. Keep items that you use a lot in easy-to-reach places. If you need to reach something above you, use a strong step stool that has a grab bar. Keep electrical cords out of the way. Do not use floor polish or wax that makes floors slippery. If you must use wax, use non-skid floor wax. Do not have throw rugs and other things on the floor that can make you trip. What can I do with my stairs? Do not leave any items on the stairs. Make sure that there are handrails on both sides of the stairs and use them. Fix handrails that are broken or loose. Make sure that handrails are as long as the stairways. Check any carpeting to make sure that it is firmly attached to the stairs. Fix any carpet that is loose  or worn. Avoid having throw rugs at the top or bottom of the stairs. If you do have throw rugs, attach them to the floor with carpet tape. Make sure that you have a light switch at the top of the stairs and the bottom of the stairs. If you do not have them, ask someone to add them for you. What else can I do to help prevent falls? Wear shoes that: Do not have high heels. Have rubber bottoms. Are comfortable and fit you well. Are closed at the toe. Do not wear sandals. If you use a stepladder: Make sure that it is fully opened. Do not climb a closed stepladder. Make sure that both sides of the stepladder are locked into place. Ask  someone to hold it for you, if possible. Clearly mark and make sure that you can see: Any grab bars or handrails. First and last steps. Where the edge of each step is. Use tools that help you move around (mobility aids) if they are needed. These include: Canes. Walkers. Scooters. Crutches. Turn on the lights when you go into a dark area. Replace any light bulbs as soon as they burn out. Set up your furniture so you have a clear path. Avoid moving your furniture around. If any of your floors are uneven, fix them. If there are any pets around you, be aware of where they are. Review your medicines with your doctor. Some medicines can make you feel dizzy. This can increase your chance of falling. Ask your doctor what other things that you can do to help prevent falls. This information is not intended to replace advice given to you by your health care provider. Make sure you discuss any questions you have with your health care provider. Document Released: 12/20/2008 Document Revised: 08/01/2015 Document Reviewed: 03/30/2014 Elsevier Interactive Patient Education  2017 [employer].

## 2020-09-29 MED ORDER — PREVNAR 20 0.5 ML IM SUSY: 0.5000 mL | PREFILLED_SYRINGE | INTRAMUSCULAR | 0 refills | Status: AC

## 2020-10-30 ENCOUNTER — Telehealth: Payer: Self-pay

## 2020-10-30 NOTE — Chronic Care Management (AMB) (Signed)
Chronic Care Management Pharmacy Assistant   Name: Jacqueline Bell  MRN: [id number] DOB: 1950-07-16   Reason for Encounter: Disease State/ Diabetes  Recent office visits:  08-27-2020 Arnette Felts, FNP. Prevnar and shingrix given. Glucose= 140. A1C= 7.6. Cholesterol= 225, LDL= 151  09-05-2020 Little, Karma Lew, RN (CCM)  09-25-2020 Barb Merino, LPN. Medicare wellness  Recent consult visits:  07-23-2020 [name], MD (Orthopedics). Imaging on right lower extremity.  Hospital visits:  None in previous 6 months  Medications: Outpatient Encounter Medications as of 10/30/2020  Medication Sig Note   cholecalciferol (VITAMIN D) 1000 units tablet Take 1,000 Units by mouth daily.    dapagliflozin propanediol (FARXIGA) 5 MG TABS tablet Take 1 tablet (5 mg total) by mouth daily before breakfast.    EPINEPHrine 0.3 mg/0.3 mL IJ SOAJ injection     fluticasone (FLONASE) 50 MCG/ACT nasal spray USE 1 SPRAY IN EACH NOSTRIL DAILY    insulin aspart (NOVOLOG FLEXPEN) 100 UNIT/ML FlexPen Per SS: < 70 = 0; 70-90 = 4 units; 91-130 = 6 units; 131-150 = 7 units; 151-200 = 8 units; 201-250 = 9 units; 251-300 = 10 units; 301-350 = 11 units; 351-400 = 12 units; 401-450 = 13 units; > 450 = 14 units.    insulin degludec (TRESIBA FLEXTOUCH) 100 UNIT/ML FlexTouch Pen Inject 20 Units into the skin daily.    loratadine (CLARITIN) 10 MG tablet TAKE 1 TABLET DAILY    magnesium oxide (MAG-OX) 400 MG tablet Take 400 mg by mouth daily.    meloxicam (MOBIC) 15 MG tablet TAKE 1 TABLET AS NEEDED    Multiple Vitamin (MULTIVITAMIN) tablet Take 1 tablet by mouth daily.    Olopatadine HCl (PAZEO) 0.7 % SOLN Place 1 drop into both eyes daily. 05/19/2018: Patient is taking as needed.    Omega-3 Fatty Acids (FISH OIL PO) Take 2 capsules by mouth daily.     OZEMPIC, 1 MG/DOSE, 2 MG/1.5ML SOPN INJECT 1 MG UNDER THE SKIN ONCE WEEKLY    POTASSIUM BICARBONATE PO Take 1 tablet by mouth. Patient takes 2 tablets daily     pravastatin (PRAVACHOL) 40 MG tablet TAKE 1 TABLET EVERY EVENING    telmisartan (MICARDIS) 20 MG tablet TAKE 1 TABLET DAILY    No facility-administered encounter medications on file as of 10/30/2020.   Recent Relevant Labs: Lab Results  Component Value Date/Time   HGBA1C 7.6 (H) 08/27/2020 12:56 PM   HGBA1C 8.1 (H) 05/23/2020 01:01 PM   MICROALBUR 10 05/17/2019 12:47 PM   MICROALBUR 30 05/05/2018 01:12 PM    Kidney Function Lab Results  Component Value Date/Time   CREATININE 0.81 08/27/2020 12:56 PM   CREATININE 0.82 05/23/2020 01:01 PM   GFRNONAA 84 02/22/2020 12:11 PM   GFRAA 96 02/22/2020 12:11 PM    Current antihyperglycemic regimen:  Farxiga 5 mg daily Novolog 100 unit sliding scale Tresiba inject 20 units daily Ozempic 1 mg weekly  What recent interventions/DTPs have been made to improve glycemic control:  None  Have there been any recent hospitalizations or ED visits since last visit with CPP? No  Patient denies hypoglycemic symptoms  Patient denies hyperglycemic symptoms  How often are you checking your blood sugar? once daily and 3-4 times daily  What are your blood sugars ranging?  Fasting: 126, 120 Before meals: 155 After meals: 178,120, 115 Bedtime: 90  During the week, how often does your blood glucose drop below 70? Never Are you checking your feet daily/regularly?   Adherence  Review: Is the patient currently on a STATIN medication? Yes Is the patient currently on ACE/ARB medication? Yes Does the patient have >5 day gap between last estimated fill dates? No  Care Gaps: Yearly Ophthalmology overdue Covid booster overdue  Medicare wellness 10-22-2021  Star Rating Drugs: Farxiga 5 mg- Last filled 07-22-2020 [facility] (Patient reported 09-27-2020) Pravastatin 40 mg- Last filled 09-23-2020 [facility] Telmisartan 20 mg- Last filled 09-23-2020 [facility]  Ozempic 1 mg- Last filled 06-21-2020 [facility]  (patient is ordering) Gaspar Garbe CMA a message to provide patient with sample since she will be going out of town in a few days.  Huey Romans [facility] Clinical Pharmacist Assistant [phone]

## 2020-11-22 ENCOUNTER — Other Ambulatory Visit: Payer: Self-pay | Admitting: Nurse Practitioner

## 2020-11-22 DIAGNOSIS — E119 Type 2 diabetes mellitus without complications: Secondary | ICD-10-CM

## 2020-12-03 ENCOUNTER — Telehealth: Payer: Self-pay

## 2020-12-03 NOTE — Chronic Care Management (AMB) (Signed)
    Called Jacqueline Bell, No answer, left message of appointment on 12-03-2020 at 9:45 via telephone visit with Cherylin Mylar, Pharm D. Notified to have all medications, supplements, blood pressure and/or blood sugar logs available during appointment and to return call if need to reschedule.   Care Gaps: Yearly Ophthalmology overdue Covid booster overdue  Medicare wellness 10-22-2021  Star Rating Drug: [name] 5 mg- Last [facility]-22-2020 [facility] Pravastatin 40 mg- Last filled 09-23-2020 [facility] Telmisartan 20 mg- Last filled 09-23-2020 [facility]  Ozempic 1 mg- Last filled   Any gaps in medications fill history? yes  Huey Romans [facility] Clinical Pharmacist Assistant [phone]

## 2020-12-04 ENCOUNTER — Encounter: Payer: Self-pay | Admitting: Nurse Practitioner

## 2020-12-04 ENCOUNTER — Other Ambulatory Visit: Payer: Self-pay | Admitting: Nurse Practitioner

## 2020-12-04 ENCOUNTER — Ambulatory Visit (INDEPENDENT_AMBULATORY_CARE_PROVIDER_SITE_OTHER): Payer: Medicare Other

## 2020-12-04 DIAGNOSIS — E782 Mixed hyperlipidemia: Secondary | ICD-10-CM

## 2020-12-04 DIAGNOSIS — Z794 Long term (current) use of insulin: Secondary | ICD-10-CM

## 2020-12-04 DIAGNOSIS — E119 Type 2 diabetes mellitus without complications: Secondary | ICD-10-CM

## 2020-12-04 HISTORY — DX: Mixed hyperlipidemia: E78.2

## 2020-12-04 MED ORDER — PRAVASTATIN SODIUM 80 MG PO TABS: 80.0000 mg | ORAL_TABLET | Freq: Every evening | ORAL | 1 refills | Status: DC

## 2020-12-04 NOTE — Progress Notes (Signed)
Chronic Care Management Pharmacy Note  12/05/2020 Name:  Jacqueline Bell MRN:  [id number] DOB:  26-Oct-1950  Summary: Patient reports that she is doing well. She had low BS readings for three days straight.   Recommendations/Changes made from today's visit: Recommend patient receive COVID -19 booster and influenza vaccine.   Plan: Patient to receive influenza vaccine tomorrow. She will follow my advice and wait at least two  weeks to receive the COVID-19 booster at [facility].    Subjective: Jacqueline Bell is an 70 y.o. year old female who is a primary patient of [name], [place].  The CCM team was consulted for assistance with disease management and care coordination needs.    Engaged with patient by telephone for follow up visit in response to provider referral for pharmacy case management and/or care coordination services. 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  Consent to Services:  The patient was given information about Chronic Care Management services, agreed to services, and gave verbal consent prior to initiation of services.  Please see initial visit note for detailed documentation.   Patient Care Team: [name], FNP as PCP - General (General Practice) Warden Fillers, MD as Consulting Physician (Ophthalmology) Lynne Logan, RN as Case Manager  Recent office visits: 08/27/2020 PCP OV   Recent consult visits: 06/12/2020 Sports Medicine [facility] visits: None in previous 6 months   Objective:  Lab Results  Component Value Date   CREATININE 0.81 08/27/2020   BUN 11 08/27/2020   GFRNONAA 84 02/22/2020   GFRAA 96 02/22/2020   NA 138 08/27/2020   K 4.2 08/27/2020   CALCIUM 8.9 08/27/2020   CO2 24 08/27/2020   GLUCOSE 140 (H) 08/27/2020    Lab Results  Component Value Date/Time   HGBA1C 7.6 (H) 08/27/2020 12:56 PM   HGBA1C 8.1 (H) 05/23/2020 01:01 PM   MICROALBUR 10 05/17/2019 12:47 PM   MICROALBUR 30 05/05/2018  01:12 PM    Last diabetic Eye exam:  Lab Results  Component Value Date/Time   HMDIABEYEEXA Retinopathy (A) 06/09/2017 12:00 AM    Last diabetic Foot exam: No results found for: HMDIABFOOTEX   Lab Results  Component Value Date   CHOL 225 (H) 08/27/2020   HDL 54 08/27/2020   LDLCALC 151 (H) 08/27/2020   TRIG 110 08/27/2020   CHOLHDL 4.2 08/27/2020    Hepatic Function Latest Ref Rng & Units 08/27/2020 05/23/2020 02/22/2020  Total Protein 6.0 - 8.5 g/dL 7.6 7.7 7.2  Albumin 3.8 - 4.8 g/dL 4.6 4.7 4.2  AST 0 - 40 IU/L _0 ALT 0 - 32 IU/L _1 Alk Phosphatase 44 - 121 IU/L 59 48 47  Total Bilirubin 0.0 - 1.2 mg/dL 0.3 0.6 0.2    Lab Results  Component Value Date/Time   TSH 0.482 05/05/2018 02:14 PM    CBC Latest Ref Rng & Units 08/27/2020 04/21/2008  WBC 3.4 - 10.8 x10E3/uL 4.0 4.2  Hemoglobin 11.1 - 15.9 g/dL 12.9 12.4  Hematocrit 34.0 - 46.6 % 38.2 36.6  Platelets 150 - 450 x10E3/uL 275 236    Lab Results  Component Value Date/Time   VD25OH 46.6 11/23/2019 02:26 PM   VD25OH 86.3 05/17/2019 02:39 PM    Clinical ASCVD: No  The 10-year ASCVD risk score (Arnett DK, et al., 2019) is: 38.2%   Values used to calculate the score:     Age: 70 years     Sex: Female  Is Non-Hispanic African American: Yes     Diabetic: Yes     Tobacco smoker: No     Systolic Blood Pressure: 384 mmHg     Is BP treated: Yes     HDL Cholesterol: 54 mg/dL     Total Cholesterol: 225 mg/dL    Depression screen [facility] 2/9 09/25/2020 05/23/2020 05/17/2019  Decreased Interest 0 0 0  Down, Depressed, Hopeless 0 0 0  PHQ - 2 Score 0 0 0  Altered sleeping - - 3  Tired, decreased energy - - 0  Change in appetite - - 0  Feeling bad or failure about yourself  - - 0  Trouble concentrating - - 0  Moving slowly or fidgety/restless - - 0  Suicidal thoughts - - 0  PHQ-9 Score - - 3  Difficult doing work/chores - - Not difficult at all      Social History   Tobacco Use  Smoking Status  Never  Smokeless Tobacco Never   BP Readings from Last 3 Encounters:  08/27/20 (!) 142/78  06/12/20 (!) 142/68  05/23/20 138/80   Pulse Readings from Last 3 Encounters:  08/27/20 78  05/23/20 81  02/22/20 76   Wt Readings from Last 3 Encounters:  09/25/20 183 lb (83 kg)  08/27/20 191 lb 3.2 oz (86.7 kg)  06/12/20 187 lb (84.8 kg)   BMI Readings from Last 3 Encounters:  09/25/20 27.83 kg/m  08/27/20 29.95 kg/m  06/12/20 28.86 kg/m    Assessment/Interventions: Review of patient past medical history, allergies, medications, health status, including review of consultants reports, laboratory and other test data, was performed as part of comprehensive evaluation and provision of chronic care management services.   SDOH:  (Social Determinants of Health) assessments and interventions performed: Yes  SDOH Screenings   Alcohol Screen: Not on file  Depression (PHQ2-9): Low Risk    PHQ-2 Score: 0  Financial Resource Strain: Low Risk    Difficulty of Paying Living Expenses: Not hard at all  Food Insecurity: No Food Insecurity   Worried About Charity fundraiser in the Last Year: Never true   Ran Out of Food in the Last Year: Never true  Housing: Not on file  Physical Activity: Insufficiently Active   Days of Exercise per Week: 3 days   Minutes of Exercise per Session: 20 min  Social Connections: Not on file  Stress: No Stress Concern Present   Feeling of Stress : Not at all  Tobacco Use: Low Risk    Smoking Tobacco Use: Never   Smokeless Tobacco Use: Never  Transportation Needs: No Transportation Needs   Lack of Transportation (Medical): No   Lack of Transportation (Non-Medical): No    CCM Care Plan  Allergies  Allergen Reactions   Iodine Hives   Shellfish Allergy Hives    Medications Reviewed Today     Reviewed by Mayford Knife, RPH (Pharmacist) on 12/04/20 at [place] List Status: <None>   Medication Order Taking? Sig Documenting Provider Last Dose Status  Informant  cholecalciferol (VITAMIN D) 1000 units tablet [id number] No Take 1,000 Units by mouth daily. [provider] Taking Active   dapagliflozin propanediol (FARXIGA) 5 MG TABS tablet [id number] No Take 1 tablet (5 mg total) by mouth daily before breakfast. [name], FNP Taking Active   EPINEPHrine 0.3 mg/0.3 mL IJ SOAJ injection [id number] No  [provider] Taking Active   fluticasone (FLONASE) 50 MCG/ACT nasal spray [id number] No USE 1 SPRAY IN [facility]  NOSTRIL DAILY Bobbitt, [place] Muta, MD Taking Active   insulin aspart (NOVOLOG FLEXPEN) 100 UNIT/ML FlexPen [id number] No Per SS: < 70 = 0; 70-90 = 4 units; 91-130 = 6 units; 131-150 = 7 units; 151-200 = 8 units; 201-250 = 9 units; 251-300 = 10 units; 301-350 = 11 units; 351-400 = 12 units; 401-450 = 13 units; > 450 = 14 units. [name], FNP Taking Active   loratadine (CLARITIN) 10 MG tablet [id number] No TAKE 1 TABLET DAILY [name], FNP Taking Active   magnesium oxide (MAG-OX) 400 MG tablet [id number] No Take 400 mg by mouth daily. [provider] Taking Active Self  meloxicam (MOBIC) 15 MG tablet [id number] No TAKE 1 TABLET AS NEEDED [name], FNP Taking Active   Multiple Vitamin (MULTIVITAMIN) tablet [id number] No Take 1 tablet by mouth daily. [provider] Taking Active   Olopatadine HCl (PAZEO) 0.7 % SOLN [id number] No Place 1 drop into both eyes daily. Bobbitt, [place] Muta, MD Taking Active            Med Note (LITTLE, Simeon Craft May 19, 2018 11:45 AM) Patient is taking as needed.   Omega-3 Fatty Acids (FISH OIL PO) [id number] No Take 2 capsules by mouth daily.  [provider] Taking Active   OZEMPIC, 1 MG/DOSE, 2 MG/1.5ML SOPN [id number] No INJECT 1 MG UNDER THE SKIN ONCE WEEKLY [name], FNP Taking Active   POTASSIUM BICARBONATE PO [id number] No Take 1 tablet by mouth. Patient takes 2 tablets daily [provider] Taking Active   pravastatin (PRAVACHOL) 40 MG tablet  [id number] No TAKE 1 TABLET EVERY Carlena Hurl, Doreene Burke, FNP Taking Active   telmisartan (MICARDIS) 20 MG tablet [id number] No TAKE 1 TABLET DAILY [name], FNP Taking Active   TRESIBA FLEXTOUCH 100 UNIT/ML FlexTouch Pen [id number]  INJECT 20 UNITS UNDER THE SKIN DAILY [name], FNP  Active             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   Allergic reaction 04/04/2018   Cramp and spasm 02/24/2018   History of food allergy 02/24/2018   Type 2 diabetes mellitus without complication, with long-term current use of insulin ([place]) 02/24/2018   Insomnia 02/24/2018    Immunization History  Administered Date(s) Administered   Fluad Quad(high Dose 65+) 11/23/2019   Influenza, High Dose Seasonal PF 11/28/2018   Influenza-Unspecified 11/18/2017   PFIZER(Purple Top)SARS-COV-2 Vaccination 04/07/2019, 05/01/2019, 01/17/2020   PNEUMOCOCCAL CONJUGATE-20 08/28/2020    Conditions to be addressed/monitored:  Hyperlipidemia and Diabetes  Care Plan : [place]  Updates made by Mayford Knife, [place] since 12/05/2020 12:00 AM     Problem: DM, HYPERLIPIDEMIA      Goal: Patient Stated   This Visit's Progress: On track  Note:   Current Barriers:  Does not maintain contact with provider office Does not contact provider office for questions/concerns  Pharmacist Clinical Goal(s):  Patient will achieve adherence to monitoring guidelines and medication adherence to achieve therapeutic efficacy through collaboration with PharmD and provider.   Interventions: 1:1 collaboration with [name], FNP regarding development and update of comprehensive plan of care as evidenced by provider attestation and co-signature Inter-disciplinary care team collaboration (see longitudinal  plan of care) Comprehensive medication review performed; medication list updated in electronic  medical record   Diabetes (A1c goal <7%) -Uncontrolled -Current medications: Farxiga 5 mg taking 1 tablet by mouth daily  Ozempic $Remove'1mg'zaCcrFb$  once weekly on Wednesday Novolog 100 - sliding scale Tresiba 20 units at night -Current home glucose readings - checking BS 4-5 times per day, Freestyle Libre fasting glucose: 90 day graph average  97-120- 148-157 Patient reports having some low readings: 12AM -11/29/2020 - 50 BS, 11/30/2020 - 50 at 6AM, 12/01/2020 - 57  Patient reports that her routine was different do to a church function  -Denies hypoglycemic/hyperglycemic symptoms -Current meal patterns:  brunch: french toast with fruit  dinner: she takes a break between 6-7 pm, she might have a salad with a chicken sausage and piece of bread, with ginger water - with no sweetener snacks: muscadine grapes, water - cucumber, lemon, blueberry, chia seeds, strawberries drinks: plenty of water -Current exercise: she does walking daily, and she has an app that she does exercise with as well, she also sometimes does exercise with her hula hoop -Educated on Prevention and management of hypoglycemic episodes; -Recommend patient having a snack prior to bed  -Counseled to check feet daily and get yearly eye exams -Recommended to continue current medication Collaborated with PCP team to possibly decrease Tresiba due to low BS readings    Hyperlipidemia: (LDL goal < 70) -Uncontrolled -Current treatment: Pravastatin 40 mg tablet taking it every evening. -Medications previously tried: rosuvastatin   -Current dietary patterns: limit the amount of fried  -Educated on Cholesterol goals;  Benefits of statin for ASCVD risk reduction; Importance of limiting foods high in cholesterol; -Recommended to continue current medication Collaborated with patient and PCP team and recommended that patient statin medication should be  increased to Pravastatin 80 mg tablet once per day at bedtime. Patient to come in 6 weeks to have labs completed.   Patient is okay with coming in to have labs completed, the best time for her is between 11:30-1:30 pm.   Health Maintenance -Vaccine gaps: Shingrix Vaccine, COVID-19 Booster vaccine, Influenza vaccine  -Patient is going to schedule her COVID-19 booster three weeks after she gets her flu shot from the city, in three weeks will get booster.  -Shingles vaccine is pending.  -Educated on the importance of vaccination    Patient Goals/Self-Care Activities Patient will:  - take medications as prescribed  Follow Up Plan: The patient has been provided with contact information for the care management team and has been advised to call with any health related questions or concerns.       Medication Assistance: None required.  Patient affirms current coverage meets needs.  Compliance/Adherence/Medication fill history: Care Gaps: Shingrix Vaccine COVID-19 Vaccine booster Ophthalmology Exam  Influenza Vaccine   Star-Rating Drugs: Farxiga 5 mg tablet Ozempic 1 mg dose Pravastatin 40 mg tablet Telmisartan 20 mg tablet   Patient's preferred pharmacy is:  [facility] - [address], [address] [place] [address] Phone: [phone] Fax: [phone]  [facility] [phone] - [place], [place] - [address] [place] [address] Phone: [phone] Fax: [phone]  [facility] [place], [place] [address] [place] [address] Phone: [phone] Fax: [phone]  Uses pill box? Yes Pt endorses 90% compliance  We discussed: Benefits of medication synchronization, packaging and delivery as well as enhanced pharmacist oversight with  [employer]. Patient decided to: Continue current medication  management strategy  Care Plan and Follow Up Patient Decision:  Patient agrees to Care Plan and Follow-up.  Plan: The patient has been provided with contact information for the care management team and has been advised to call with any health related questions or concerns.   [name], PharmD Clinical Pharmacist [facility] [phone]

## 2020-12-05 NOTE — Patient Instructions (Signed)
Visit Information It was great speaking with you today!  Please let me know if you have any questions about our visit.   Goals Addressed             This Visit's Progress    COMPLETED: Manage My Medicine       Timeframe:  Long-Range Goal Priority:  High Start Date:                             Expected End Date:                       Follow Up Date 02/27/2021    - call for medicine refill 2 or 3 days before it runs out - call if I am sick and can't take my medicine - keep a list of all the medicines I take; vitamins and herbals too - learn to read medicine labels - use an alarm clock or phone to remind me to take my medicine    Why is this important?   These steps will help you keep on track with your medicines.   Notes:  Please call if you have any questions.         Patient Care Plan: CCM Pharmacy Care Plan     Problem Identified: DM, HYPERLIPIDEMIA      Goal: Patient Stated   This Visit's Progress: On track  Note:   Current Barriers:  Does not maintain contact with provider office Does not contact provider office for questions/concerns  Pharmacist Clinical Goal(s):  Patient will achieve adherence to monitoring guidelines and medication adherence to achieve therapeutic efficacy through collaboration with PharmD and provider.   Interventions: 1:1 collaboration with Arnette Felts, FNP regarding development and update of comprehensive plan of care as evidenced by provider attestation and co-signature Inter-disciplinary care team collaboration (see longitudinal plan of care) Comprehensive medication review performed; medication list updated in electronic medical record   Diabetes (A1c goal <7%) -Uncontrolled -Current medications: Farxiga 5 mg taking 1 tablet by mouth daily  Ozempic 1mg  once weekly on Wednesday Novolog 100 - sliding scale Tresiba 20 units at night -Current home glucose readings - checking BS 4-5 times per day, Freestyle Libre fasting  glucose: 90 day graph average  97-120- 148-157 Patient reports having some low readings: 12AM -11/29/2020 - 50 BS, 11/30/2020 - 50 at 6AM, 12/01/2020 - 57  Patient reports that her routine was different do to a church function  -Denies hypoglycemic/hyperglycemic symptoms -Current meal patterns:  brunch: french toast with fruit  dinner: she takes a break between 6-7 pm, she might have a salad with a chicken sausage and piece of bread, with ginger water - with no sweetener snacks: muscadine grapes, water - cucumber, lemon, blueberry, chia seeds, strawberries drinks: plenty of water -Current exercise: she does walking daily, and she has an app that she does exercise with as well, she also sometimes does exercise with her hula hoop -Educated on Prevention and management of hypoglycemic episodes; -Recommend patient having a snack prior to bed  -Counseled to check feet daily and get yearly eye exams -Recommended to continue current medication Collaborated with PCP team to possibly decrease Tresiba due to low BS readings    Hyperlipidemia: (LDL goal < 70) -Uncontrolled -Current treatment: Pravastatin 40 mg tablet taking it every evening. -Medications previously tried: rosuvastatin   -Current dietary patterns: limit the amount of fried  -Educated on Cholesterol goals;  Benefits  of statin for ASCVD risk reduction; Importance of limiting foods high in cholesterol; -Recommended to continue current medication Collaborated with patient and PCP team and recommended that patient statin medication should be increased to Pravastatin 80 mg tablet once per day at bedtime. Patient to come in 6 weeks to have labs completed.   Patient is okay with coming in to have labs completed, the best time for her is between 11:30-1:30 pm.   Health Maintenance -Vaccine gaps: Shingrix Vaccine, COVID-19 Booster vaccine, Influenza vaccine  -Patient is going to schedule her COVID-19 booster three weeks after she gets her flu  shot from the city, in three weeks will get booster.  -Shingles vaccine is pending.  -Educated on the importance of vaccination    Patient Goals/Self-Care Activities Patient will:  - take medications as prescribed  Follow Up Plan: The patient has been provided with contact information for the care management team and has been advised to call with any health related questions or concerns.        Patient agreed to services and verbal consent obtained.   The patient verbalized understanding of instructions, educational materials, and care plan provided today and agreed to receive a mailed copy of patient instructions, educational materials, and care plan.   Cherylin Mylar, PharmD Clinical Pharmacist [facility] [phone]

## 2020-12-06 ENCOUNTER — Telehealth: Payer: Medicare Other

## 2020-12-06 ENCOUNTER — Ambulatory Visit: Payer: Self-pay

## 2020-12-06 DIAGNOSIS — E119 Type 2 diabetes mellitus without complications: Secondary | ICD-10-CM

## 2020-12-06 DIAGNOSIS — I10 Essential (primary) hypertension: Secondary | ICD-10-CM

## 2020-12-06 DIAGNOSIS — E782 Mixed hyperlipidemia: Secondary | ICD-10-CM

## 2020-12-06 DIAGNOSIS — G4700 Insomnia, unspecified: Secondary | ICD-10-CM

## 2020-12-06 DIAGNOSIS — Z794 Long term (current) use of insulin: Secondary | ICD-10-CM | POA: Diagnosis not present

## 2020-12-09 NOTE — Patient Instructions (Signed)
Visit Information  PATIENT GOALS:  Goals Addressed      Elevated Cholesterol - complications prevented or minimized       Timeframe:  Long-Range Goal Priority:  High Start Date:  09/05/20                          Expected End Date:  09/05/21  Next Scheduled follow up date: 02/05/21             Self Care Activities:  Self administers medications as prescribed Attends all scheduled provider appointments Calls pharmacy for medication refills Calls provider office for new concerns or questions Patient Goals: - Adhere to dietary and exercise recommendations              Hypertension Monitored   On track    Timeframe:  Long-Range Goal Priority:  High Start Date:  12/06/20                           Expected End Date: 12/06/21  Follow up date: 02/05/21   Patient Goals: - check blood pressure 3 times per week - learn about high blood pressure                        Monitor and Manage My Blood Sugar-Diabetes Type 2   On track    Timeframe:  Long-Range Goal Priority:  High Start Date:  09/05/20                           Expected End Date:  09/05/21                     Follow Up Date: 02/05/21    - check blood sugar at prescribed times - check blood sugar if I feel it is too high or too low - enter blood sugar readings and medication or insulin into daily log - take the blood sugar log to all doctor visits - take the blood sugar meter to all doctor visits    Why is this important?   Checking your blood sugar at home helps to keep it from getting very high or very low.  Writing the results in a diary or log helps the doctor know how to care for you.  Your blood sugar log should have the time, date and the results.  Also, write down the amount of insulin or other medicine that you take.  Other information, like what you ate, exercise done and how you were feeling, will also be helpful.     Notes:      Obtain Eye Exam-Diabetes Type 2       Timeframe:  Long-Range Goal Priority:   Medium Start Date:  09/05/20                           Expected End Date:  09/05/21                      Follow Up Date 02/05/21    - keep appointment with eye doctor - schedule appointment with eye doctor    Why is this important?   Eye check-ups are important when you have diabetes.  Vision loss can be prevented.    Notes:      Perform Foot Care-Diabetes Type 2  Timeframe:  Long-Range Goal Priority:  Medium Start Date:  09/05/20                           Expected End Date:  09/05/21                     Follow Up Date: 02/05/21    - check feet daily for cuts, sores or redness - do heel pump exercise 2 to 3 times each day - keep feet up while sitting - trim toenails straight across - wash and dry feet carefully every day - wear comfortable, cotton socks - wear comfortable, well-fitting shoes    Why is this important?   Good foot care is very important when you have diabetes.  There are many things you can do to keep your feet healthy and catch a problem early.    Notes:      The patient verbalized understanding of instructions, educational materials, and care plan provided today and declined offer to receive copy of patient instructions, educational materials, and care plan.   Telephone follow up appointment with care management team member scheduled for: 02/05/21  Delsa Sale, RN, BSN, CCM Care Management Coordinator [facility] Care Management/[facility]  Direct Phone: [phone]

## 2020-12-09 NOTE — Chronic Care Management (AMB) (Signed)
Chronic Care Management   CCM RN Visit Note  02/05/2021 Name: Jacqueline Bell MRN: [id number] DOB: June 17, 1950  Subjective: Jacqueline Bell is a 70 y.o. year old female who is a primary care patient of [name], [place]. The care management team was consulted for assistance with disease management and care coordination needs.    Engaged with patient by telephone for follow up visit in response to provider referral for case management and/or care coordination services.   Consent to Services:  The patient was given information about Chronic Care Management services, agreed to services, and gave verbal consent prior to initiation of services.  Please see initial visit note for detailed documentation.   Patient agreed to services and verbal consent obtained.   Assessment: Review of patient past medical history, allergies, medications, health status, including review of consultants reports, laboratory and other test data, was performed as part of comprehensive evaluation and provision of chronic care management services.   SDOH (Social Determinants of Health) assessments and interventions performed:  Yes, no acute needs   CCM Care Plan  Allergies  Allergen Reactions   Iodine Hives   Shellfish Allergy Hives    Outpatient Encounter Medications as of 12/06/2020  Medication Sig Note   dapagliflozin propanediol (FARXIGA) 5 MG TABS tablet Take 1 tablet (5 mg total) by mouth daily before breakfast.    insulin aspart (NOVOLOG FLEXPEN) 100 UNIT/ML FlexPen Per SS: < 70 = 0; 70-90 = 4 units; 91-130 = 6 units; 131-150 = 7 units; 151-200 = 8 units; 201-250 = 9 units; 251-300 = 10 units; 301-350 = 11 units; 351-400 = 12 units; 401-450 = 13 units; > 450 = 14 units.    OZEMPIC, 1 MG/DOSE, 2 MG/1.5ML SOPN INJECT 1 MG UNDER THE SKIN ONCE WEEKLY    TRESIBA FLEXTOUCH 100 UNIT/ML FlexTouch Pen INJECT 20 UNITS UNDER THE SKIN DAILY (Patient taking differently: Patient is taking 12-20 units under the skin  daily)    cholecalciferol (VITAMIN D) 1000 units tablet Take 1,000 Units by mouth daily.    EPINEPHrine 0.3 mg/0.3 mL IJ SOAJ injection     fluticasone (FLONASE) 50 MCG/ACT nasal spray USE 1 SPRAY IN EACH NOSTRIL DAILY    loratadine (CLARITIN) 10 MG tablet TAKE 1 TABLET DAILY    magnesium oxide (MAG-OX) 400 MG tablet Take 400 mg by mouth daily.    meloxicam (MOBIC) 15 MG tablet TAKE 1 TABLET AS NEEDED    Multiple Vitamin (MULTIVITAMIN) tablet Take 1 tablet by mouth daily.    Olopatadine HCl (PAZEO) 0.7 % SOLN Place 1 drop into both eyes daily. 05/19/2018: Patient is taking as needed.    Omega-3 Fatty Acids (FISH OIL PO) Take 2 capsules by mouth daily.     POTASSIUM BICARBONATE PO Take 1 tablet by mouth. Patient takes 2 tablets daily    pravastatin (PRAVACHOL) 80 MG tablet Take 1 tablet (80 mg total) by mouth every evening.    telmisartan (MICARDIS) 20 MG tablet TAKE 1 TABLET DAILY    No facility-administered encounter medications on file as of 12/06/2020.    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   Allergic reaction 04/04/2018   Cramp and spasm 02/24/2018   History of food allergy 02/24/2018   Type 2 diabetes mellitus without complication, with long-term current use of insulin (  [place]) 02/24/2018   Insomnia 02/24/2018    Conditions to be addressed/monitored: Type 2 Diabetes, Hypertension, Insomnia  Care Plan : Diabetes Type 2 (Adult)  Updates made by Lynne Logan, RN since 02/05/2021 12:00 AM     Problem: Glycemic Management (Diabetes, Type 2)   Priority: High     Long-Range Goal: Glycemic Management Optimized   Start Date: 09/05/2020  Expected End Date: 09/05/2021  Recent Progress: On track  Priority: High  Note:   Objective:  Lab Results  Component Value Date   HGBA1C 7.6  (H) 08/27/2020   Lab Results  Component Value Date   CREATININE 0.81 08/27/2020   CREATININE 0.82 05/23/2020   CREATININE 0.74 02/22/2020   Lab Results  Component Value Date   EGFR 79 08/27/2020  Current Barriers:  Knowledge Deficits related to basic Diabetes pathophysiology and self care/management Knowledge Deficits related to medications used for management of diabetes Case Manager Clinical Goal(s):  patient will demonstrate improved adherence to prescribed treatment plan for diabetes self care/management as evidenced by: daily monitoring and recording of CBG  adherence to ADA/ carb modified diet exercise 5 days/week adherence to prescribed medication regimen contacting provider for new or worsened symptoms or questions Interventions:  12/06/20 completed successful outbound call with patient  Collaboration with [name], [place] regarding development and update of comprehensive plan of care as evidenced by provider attestation and co-signature Inter-disciplinary care team collaboration (see longitudinal plan of care) Provided education to patient about basic DM disease process Review of patient status, including review of consultant's reports, relevant laboratory and other test results, and medications completed. Reviewed medications with patient and discussed importance of medication adherence Educated patient on dietary and exercise recommendations; daily glycemic control FBS 80-130, <180 after meals;15'15' rule Advised patient, providing education and rationale, to check cbg daily before meals and at bedtime and record, calling the CCM team and or PCP for findings outside established parameters Mailed printed educational materials related to Low Carb Smoothies; Mediterranean diet  Discussed plans with patient for ongoing care management follow up and provided patient with direct contact information for care management team Self-Care Activities Self administers oral medications as  prescribed Self administers injectable DM medication (Ozempic) as prescribed Attends all scheduled provider appointments Checks blood sugars as prescribed and utilize hyper and hypoglycemia protocol as needed Adheres to prescribed ADA/carb modified Patient Goals: - check blood sugar at prescribed times - check blood sugar if I feel it is too high or too low - enter blood sugar readings and medication or insulin into daily log - take the blood sugar log to all doctor visits - take the blood sugar meter to all doctor visits - manage portion size - keep appointment with eye doctor - schedule appointment with eye doctor - check feet daily for cuts, sores or redness - do heel pump exercise 2 to 3 times each day - keep feet up while sitting - trim toenails straight across - wash and dry feet carefully every day - wear comfortable, cotton socks - wear comfortable, well-fitting shoes  Follow Up Plan: Telephone follow up appointment with care management team member scheduled for: 02/05/21    Care Plan : Hypertension (Adult)  Updates made by Lynne Logan, RN since 02/05/2021 12:00 AM     Problem: Hypertension (Hypertension)   Priority: High     Long-Range Goal: Hypertension Monitored   Start Date: 12/06/2020  Expected End Date: 12/06/2021  This Visit's Progress: On track  Priority: High  Note:  Objective:  Last practice recorded BP readings:  BP Readings from Last 3 Encounters:  08/27/20 (!) 142/78  06/12/20 (!) 142/68  05/23/20 138/80   Most recent eGFR/CrCl:  Lab Results  Component Value Date   EGFR 79 08/27/2020    No components found for: CRCL Current Barriers:  Knowledge Deficits related to basic understanding of hypertension pathophysiology and self care management Knowledge Deficits related to understanding of medications prescribed for management of hypertension Case Manager Clinical Goal(s):  patient will demonstrate improved adherence to prescribed treatment  plan for hypertension as evidenced by taking all medications as prescribed, monitoring and recording blood pressure as directed, adhering to low sodium/DASH diet Interventions:  12/06/20 completed successful outbound call with patient  Collaboration with [name], FNP regarding development and update of comprehensive plan of care as evidenced by provider attestation and co-signature Inter-disciplinary care team collaboration (see longitudinal plan of care) Evaluation of current treatment plan related to hypertension self management and patient's adherence to plan as established by provider. Provided education to patient re: stroke prevention, s/s of heart attack and stroke, DASH diet, complications of uncontrolled blood pressure Reviewed medications with patient and discussed importance of compliance Advised patient, providing education and rationale, to monitor blood pressure daily and record, calling PCP for findings outside established parameters.  Educated patient on how to accurately check her BP at home Mailed printed educational materials related to What is High Blood Pressure?; Why Should I Lower Sodium?; How to Accurately Check BP at Home Discussed plans with patient for ongoing care management follow up and provided patient with direct contact information for care management team Self-Care Activities: Self administers medications as prescribed Attends all scheduled provider appointments Calls provider office for new concerns, questions, or BP outside discussed parameters Checks BP and records as discussed Follows a low sodium diet/DASH diet Patient Goals: - check blood pressure 3 times per week - learn about high blood pressure  Follow Up Plan: Telephone follow up appointment with care management team member scheduled for: 02/05/21      Plan:Telephone follow up appointment with care management team member scheduled for:  02/05/21  Barb Merino, RN, BSN, CCM Care Management  Coordinator [place] Management/[facility]  Direct Phone: [phone]

## 2020-12-10 ENCOUNTER — Encounter: Payer: Self-pay | Admitting: Nurse Practitioner

## 2020-12-10 ENCOUNTER — Other Ambulatory Visit: Payer: Self-pay

## 2020-12-10 ENCOUNTER — Ambulatory Visit (INDEPENDENT_AMBULATORY_CARE_PROVIDER_SITE_OTHER): Payer: Medicare Other | Admitting: Nurse Practitioner

## 2020-12-10 VITALS — BP 122/68 | HR 80 | Temp 98.6°F | Ht 68.0 in | Wt 185.2 lb

## 2020-12-10 DIAGNOSIS — I10 Essential (primary) hypertension: Secondary | ICD-10-CM | POA: Diagnosis not present

## 2020-12-10 DIAGNOSIS — E1169 Type 2 diabetes mellitus with other specified complication: Secondary | ICD-10-CM | POA: Diagnosis not present

## 2020-12-10 DIAGNOSIS — Z794 Long term (current) use of insulin: Secondary | ICD-10-CM | POA: Diagnosis not present

## 2020-12-10 DIAGNOSIS — E559 Vitamin D deficiency, unspecified: Secondary | ICD-10-CM | POA: Diagnosis not present

## 2020-12-10 DIAGNOSIS — E119 Type 2 diabetes mellitus without complications: Secondary | ICD-10-CM

## 2020-12-10 DIAGNOSIS — E782 Mixed hyperlipidemia: Secondary | ICD-10-CM | POA: Diagnosis not present

## 2020-12-10 DIAGNOSIS — R051 Acute cough: Secondary | ICD-10-CM | POA: Diagnosis not present

## 2020-12-10 MED ORDER — BENZONATATE 100 MG PO CAPS: 100.0000 mg | ORAL_CAPSULE | Freq: Four times a day (QID) | ORAL | 2 refills | Status: DC | PRN

## 2020-12-10 MED ORDER — BENZONATATE 100 MG PO CAPS: 100.0000 mg | ORAL_CAPSULE | Freq: Four times a day (QID) | ORAL | 1 refills | Status: DC | PRN

## 2020-12-10 NOTE — Progress Notes (Signed)
I,Katawbba Wiggins,acting as a Education administrator for [employer], FNP.,have documented all relevant documentation on the behalf of [name], FNP,as directed by  [name], FNP while in the presence of [name], [place].   This visit occurred during the SARS-CoV-2 public health emergency.  Safety protocols were in place, including screening questions prior to the visit, additional usage of staff PPE, and extensive cleaning of exam room while observing appropriate contact time as indicated for disinfecting solutions.  Subjective:     Patient ID: Jacqueline Bell , female    DOB: November 04, 1950 , 70 y.o.   MRN: [id number]   Chief Complaint  Patient presents with   Diabetes   Hypertension   vitamin d     HPI  The patient is here for a follow-up on her diabetes, blood pressure, and vitamin d.  The patient would also wants a prescription refill of Benzonatate for her cough that comes from time to time.   Diabetes She presents for her follow-up diabetic visit. She has type 2 diabetes mellitus. Her disease course has been stable. Pertinent negatives for hypoglycemia include no confusion, dizziness or headaches. There are no diabetic associated symptoms. Pertinent negatives for diabetes include no chest pain, no fatigue, no polydipsia, no polyphagia, no polyuria and no weakness. There are no hypoglycemic complications. Symptoms are stable. There are no diabetic complications. Risk factors for coronary artery disease include sedentary lifestyle and obesity. Current diabetic treatment includes oral agent (dual therapy). She is compliant with treatment all of the time. Her weight is stable. She is following a generally healthy diet. When asked about meal planning, she reported none. She has not had a previous visit with a dietitian. She rarely participates in exercise. There is no change in her home blood glucose trend. (Last month she had 3 lows - 49, 50 and 60's. Drank some juice and peanut butter crackers. She  does remember going to a conference and not eating as much. She did not have any symptoms. ) An ACE inhibitor/angiotensin II receptor blocker is being taken. She does not see a podiatrist.Eye exam is not current.  Hypertension This is a chronic problem. The current episode started more than 1 year ago. The problem is unchanged. The problem is uncontrolled. Pertinent negatives include no anxiety, chest pain, headaches or palpitations. Risk factors for coronary artery disease include obesity, sedentary lifestyle and diabetes mellitus. Past treatments include ACE inhibitors. The current treatment provides mild improvement. There are no compliance problems.  There is no history of angina or kidney disease. There is no history of chronic renal disease.    Past Medical History:  Diagnosis Date   Diabetes mellitus without complication ([place])    Mixed hyperlipidemia 12/04/2020     History reviewed. No pertinent family history.   Current Outpatient Medications:    cholecalciferol (VITAMIN D) 1000 units tablet, Take 1,000 Units by mouth daily., Disp: , Rfl:    dapagliflozin propanediol (FARXIGA) 5 MG TABS tablet, Take 1 tablet (5 mg total) by mouth daily before breakfast., Disp: 90 tablet, Rfl: 1   EPINEPHrine 0.3 mg/0.3 mL IJ SOAJ injection, , Disp: , Rfl:    fluticasone (FLONASE) 50 MCG/ACT nasal spray, USE 1 SPRAY IN EACH NOSTRIL DAILY, Disp: 16 g, Rfl: 0   insulin aspart (NOVOLOG FLEXPEN) 100 UNIT/ML FlexPen, Per SS: < 70 = 0; 70-90 = 4 units; 91-130 = 6 units; 131-150 = 7 units; 151-200 = 8 units; 201-250 = 9 units; 251-300 = 10 units; 301-350 = 11  units; 351-400 = 12 units; 401-450 = 13 units; > 450 = 14 units., Disp: 15 mL, Rfl: 11   loratadine (CLARITIN) 10 MG tablet, TAKE 1 TABLET DAILY, Disp: 90 tablet, Rfl: 3   magnesium oxide (MAG-OX) 400 MG tablet, Take 400 mg by mouth daily., Disp: , Rfl:    meloxicam (MOBIC) 15 MG tablet, TAKE 1 TABLET AS NEEDED, Disp: 90 tablet, Rfl: 3   Multiple Vitamin  (MULTIVITAMIN) tablet, Take 1 tablet by mouth daily., Disp: , Rfl:    Olopatadine HCl (PAZEO) 0.7 % SOLN, Place 1 drop into both eyes daily., Disp: 1 Bottle, Rfl: 5   Omega-3 Fatty Acids (FISH OIL PO), Take 2 capsules by mouth daily. , Disp: , Rfl:    OZEMPIC, 1 MG/DOSE, 2 MG/1.5ML SOPN, INJECT 1 MG UNDER THE SKIN ONCE WEEKLY, Disp: 9 mL, Rfl: 3   POTASSIUM BICARBONATE PO, Take 1 tablet by mouth. Patient takes 2 tablets daily, Disp: , Rfl:    pravastatin (PRAVACHOL) 80 MG tablet, Take 1 tablet (80 mg total) by mouth every evening., Disp: 90 tablet, Rfl: 1   telmisartan (MICARDIS) 20 MG tablet, TAKE 1 TABLET DAILY, Disp: 90 tablet, Rfl: 3   TRESIBA FLEXTOUCH 100 UNIT/ML FlexTouch Pen, INJECT 20 UNITS UNDER THE SKIN DAILY (Patient taking differently: Patient is taking 12-20 units under the skin daily), Disp: 15 mL, Rfl: 3   benzonatate (TESSALON PERLES) 100 MG capsule, Take 1 capsule (100 mg total) by mouth every 6 (six) hours as needed., Disp: 30 capsule, Rfl: 2   Allergies  Allergen Reactions   Iodine Hives   Shellfish Allergy Hives   Tramadol Rash     Review of Systems  Constitutional: Negative.  Negative for fatigue.  Respiratory: Negative.    Cardiovascular: Negative.  Negative for chest pain, palpitations and leg swelling.  Gastrointestinal: Negative.   Endocrine: Negative for polydipsia, polyphagia and polyuria.  Neurological:  Negative for dizziness, weakness and headaches.  Psychiatric/Behavioral: Negative.  Negative for confusion.   All other systems reviewed and are negative.   Today's Vitals   12/10/20 1206  BP: 122/68  Pulse: 80  Temp: 98.6 F (37 C)  Weight: 185 lb 3.2 oz (84 kg)  Height: 5' 8" (1.727 m)  PainSc: 0-No pain   Body mass index is 28.16 kg/m.  Wt Readings from Last 3 Encounters:  12/10/20 185 lb 3.2 oz (84 kg)  09/25/20 183 lb (83 kg)  08/27/20 191 lb 3.2 oz (86.7 kg)    BP Readings from Last 3 Encounters:  12/10/20 122/68  08/27/20 (!) 142/78   06/12/20 (!) 142/68    Objective:  Physical Exam Vitals reviewed.  Constitutional:      General: She is not in acute distress.    Appearance: Normal appearance. She is obese.  Cardiovascular:     Rate and Rhythm: Normal rate and regular rhythm.     Pulses: Normal pulses.     Heart sounds: Normal heart sounds. No murmur heard. Pulmonary:     Effort: Pulmonary effort is normal. No respiratory distress.     Breath sounds: Normal breath sounds. No wheezing.  Musculoskeletal:        General: No swelling or tenderness. Normal range of motion.     Cervical back: Normal range of motion and neck supple.     Right lower leg: No edema.     Left lower leg: No edema.     Comments: She is limping slightly when walking  Skin:  General: Skin is warm and dry.     Capillary Refill: Capillary refill takes less than 2 seconds.  Neurological:     General: No focal deficit present.     Mental Status: She is alert and oriented to person, place, and time.     Cranial Nerves: No cranial nerve deficit.  Psychiatric:        Mood and Affect: Mood normal.        Behavior: Behavior normal.        Thought Content: Thought content normal.        Judgment: Judgment normal.        Assessment And Plan:     1. Type 2 diabetes mellitus without complication, with long-term current use of insulin (HCC) Comments: Chronic, blood sugar has been mostly well controlled with some lows likely related to not eating and taking her medications - Hemoglobin A1c  2. Essential hypertension Comments: Blood pressure is well controlled Continue current medications  3. Vitamin D deficiency Will check vitamin D level and supplement as needed.    Also encouraged to spend 15 minutes in the sun daily.  - VITAMIN D 25 Hydroxy (Vit-D Deficiency, Fractures)  4. Mixed hyperlipidemia Comments: Chronic, stable Continue current medications with statin also for cardiac protection with Type 2 diabetes.  - CMP14+EGFR - Lipid  panel  5. Acute cough Comments: Provided limited supply of tessalon perles - benzonatate (TESSALON PERLES) 100 MG capsule; Take 1 capsule (100 mg total) by mouth every 6 (six) hours as needed.  Dispense: 30 capsule; Refill: 2    Patient was given opportunity to ask questions. Patient verbalized understanding of the plan and was able to repeat key elements of the plan. All questions were answered to their satisfaction.  [name], FNP   I, [name], FNP, have reviewed all documentation for this visit. The documentation on 12/10/20 for the exam, diagnosis, procedures, and orders are all accurate and complete.   IF YOU HAVE BEEN REFERRED TO A SPECIALIST, IT MAY TAKE 1-2 WEEKS TO SCHEDULE/PROCESS THE REFERRAL. IF YOU HAVE NOT HEARD FROM US/SPECIALIST IN TWO WEEKS, PLEASE GIVE [place] A CALL AT [phone].   THE PATIENT IS ENCOURAGED TO PRACTICE SOCIAL DISTANCING DUE TO THE COVID-19 PANDEMIC.

## 2020-12-11 LAB — HEMOGLOBIN A1C
Est. average glucose Bld gHb Est-mCnc: 177 mg/dL
Hgb A1c MFr Bld: 7.8 % — ABNORMAL HIGH (ref 4.8–5.6)

## 2020-12-11 LAB — CMP14+EGFR
ALT: 15 IU/L (ref 0–32)
AST: 28 IU/L (ref 0–40)
Albumin/Globulin Ratio: 1.4 (ref 1.2–2.2)
Albumin: 4.6 g/dL (ref 3.8–4.8)
Alkaline Phosphatase: 54 IU/L (ref 44–121)
BUN/Creatinine Ratio: 14 (ref 12–28)
BUN: 11 mg/dL (ref 8–27)
Bilirubin Total: 0.5 mg/dL (ref 0.0–1.2)
CO2: 24 mmol/L (ref 20–29)
Calcium: 9.2 mg/dL (ref 8.7–10.3)
Chloride: 99 mmol/L (ref 96–106)
Creatinine, Ser: 0.81 mg/dL (ref 0.57–1.00)
Globulin, Total: 3.2 g/dL (ref 1.5–4.5)
Glucose: 105 mg/dL — ABNORMAL HIGH (ref 70–99)
Potassium: 4.8 mmol/L (ref 3.5–5.2)
Sodium: 139 mmol/L (ref 134–144)
Total Protein: 7.8 g/dL (ref 6.0–8.5)
eGFR: 79 mL/min/{1.73_m2} (ref >59)

## 2020-12-11 LAB — VITAMIN D 25 HYDROXY (VIT D DEFICIENCY, FRACTURES): Vit D, 25-Hydroxy: 32.2 ng/mL (ref 30.0–100.0)

## 2020-12-11 LAB — LIPID PANEL
Chol/HDL Ratio: 3.2 ratio (ref 0.0–4.4)
Cholesterol, Total: 169 mg/dL (ref 100–199)
HDL: 53 mg/dL (ref >39)
LDL Chol Calc (NIH): 99 mg/dL (ref 0–99)
Triglycerides: 95 mg/dL (ref 0–149)
VLDL Cholesterol Cal: 17 mg/dL (ref 5–40)

## 2020-12-12 ENCOUNTER — Other Ambulatory Visit: Payer: Self-pay | Admitting: Nurse Practitioner

## 2020-12-12 DIAGNOSIS — E119 Type 2 diabetes mellitus without complications: Secondary | ICD-10-CM

## 2021-01-13 ENCOUNTER — Other Ambulatory Visit: Payer: Self-pay

## 2021-01-13 MED ORDER — MELOXICAM 15 MG PO TABS
15.0000 mg | ORAL_TABLET | ORAL | 0 refills | Status: DC | PRN
Start: 2021-01-13 — End: 2021-10-02

## 2021-01-14 DIAGNOSIS — M25571 Pain in right ankle and joints of right foot: Secondary | ICD-10-CM | POA: Diagnosis not present

## 2021-02-05 ENCOUNTER — Ambulatory Visit (INDEPENDENT_AMBULATORY_CARE_PROVIDER_SITE_OTHER): Payer: Medicare Other

## 2021-02-05 ENCOUNTER — Telehealth: Payer: Medicare Other

## 2021-02-05 DIAGNOSIS — G4700 Insomnia, unspecified: Secondary | ICD-10-CM

## 2021-02-05 DIAGNOSIS — E559 Vitamin D deficiency, unspecified: Secondary | ICD-10-CM

## 2021-02-05 DIAGNOSIS — E782 Mixed hyperlipidemia: Secondary | ICD-10-CM | POA: Diagnosis not present

## 2021-02-05 DIAGNOSIS — I10 Essential (primary) hypertension: Secondary | ICD-10-CM | POA: Diagnosis not present

## 2021-02-05 NOTE — Patient Instructions (Addendum)
Visit Information   Thank you for taking time to visit with me today. Please don't hesitate to contact me if I can be of assistance to you before our next scheduled telephone appointment.  Following are the goals we discussed today:  (Copy and paste patient goals from clinical care plan here)  Our next appointment is by telephone on 03/17/21 at 11:50 AM   Please call the care guide team at [phone] if you need to cancel or reschedule your appointment.   Following is a copy of your full care plan:  Care Plan : [place] of Care  Updates made by Lynne Logan, RN since 02/05/2021 12:00 AM     Problem: No plan established for management of chronic disease states (Type 2 Diabetes, Hypertension, Insomnia, Mixed hyperlipidemia, Vitamin D deficiency)   Priority: High     Long-Range Goal: Development of plan of care for chronic disease management (Type 2 Diabetes, Hypertension, Insomnia, Mixed hyperlipidemia, Vitamin D deficiency)   Start Date: 02/05/2021  Expected End Date: 02/05/2022  This Visit's Progress: On track  Priority: High  Note:   Current Barriers:  Knowledge Deficits related to plan of care for management of Type 2 Diabetes, Hypertension, Insomnia, Mixed hyperlipidemia, Vitamin D deficiency  Chronic Disease Management support and education needs related to Type 2 Diabetes, Hypertension, Insomnia, Mixed hyperlipidemia, Vitamin D deficiency   RNCM Clinical Goal(s):  Patient will verbalize basic understanding of  Type 2 Diabetes, Hypertension, Insomnia, Mixed hyperlipidemia, Vitamin D deficiency disease process and self health management plan as evidenced by patient will report having no exacerbations related to chronic disease states listed above  take all medications exactly as prescribed and will call provider for medication related questions as evidenced by patient will report no missed doses of her prescribed medications  demonstrate Improved health management  independence as evidenced by patient will maintain the ability to remain independent with self care  continue to work with RN Care Manager to address care management and care coordination needs related to  Type 2 Diabetes, Hypertension, Insomnia, Mixed hyperlipidemia, Vitamin D deficiency as evidenced by adherence to CM Team Scheduled appointments demonstrate ongoing self health care management ability   as evidenced by    through collaboration with RN Care manager, provider, and care team.   Interventions: 1:1 collaboration with primary care provider regarding development and update of comprehensive plan of care as evidenced by provider attestation and co-signature Inter-disciplinary care team collaboration (see longitudinal plan of care) Evaluation of current treatment plan related to  self management and patient's adherence to plan as established by provider   Diabetes Interventions:  (Status:  Goal on track:  Yes.) Long Term Goal Assessed patient's understanding of A1c goal: <7% Provided education to patient about basic DM disease process Reviewed medications with patient and discussed importance of medication adherence Counseled on importance of regular laboratory monitoring as prescribed Provided patient with written educational materials related to hypo and hyperglycemia and importance of correct treatment Advised patient, providing education and rationale, to check cbg daily before meals and at bedtime and record, calling PCP and or RN CM for findings outside established parameters Review of patient status, including review of consultants reports, relevant laboratory and other test results, and medications completed Assessed social determinant of health barriers Discussed plans with patient for ongoing care management follow up and provided patient with direct contact information for care management team Lab Results  Component Value Date   HGBA1C 7.8 (H) 12/10/2020  Hyperlipidemia  Interventions:  (Status:  Goal on track:  Yes.) Long Term Goal Medication review performed; medication list updated in electronic medical record.  Provider established cholesterol goals reviewed Counseled on importance of regular laboratory monitoring as prescribed Provided HLD educational materials Reviewed importance of limiting foods high in cholesterol Reviewed exercise goals and target of 150 minutes per week Discussed plans with patient for ongoing care management follow up and provided patient with direct contact information for care management team  Hypertension Interventions:  (Status:  Goal on track:  Yes.) Long Term Goal Last practice recorded BP readings:  BP Readings from Last 3 Encounters:  12/10/20 122/68  08/27/20 (!) 142/78  06/12/20 (!) 142/68  Most recent eGFR/CrCl:  Lab Results  Component Value Date   EGFR 79 12/10/2020    No components found for: CRCL  Reviewed medications with patient and discussed importance of compliance Counseled on the importance of exercise goals with target of 150 minutes per week Advised patient, providing education and rationale, to monitor blood pressure daily and record, calling PCP for findings outside established parameters Provided education on prescribed diet low Sodium  Discussed plans with patient for ongoing care management follow up and provided patient with direct contact information for care management team  Patient Goals/Self-Care Activities: Take all medications as prescribed Attend all scheduled provider appointments Call pharmacy for medication refills 3-7 days in advance of running out of medications Attend church or other social activities Perform all self care activities independently  Perform IADL's (shopping, preparing meals, housekeeping, managing finances) independently Call provider office for new concerns or questions  fill half of plate with vegetables manage portion size call doctor for signs and symptoms  of high blood pressure take medications for blood pressure exactly as prescribed adhere to prescribed diet: low saturated fat  develop an exercise routine  Follow Up Plan:  Telephone follow up appointment with care management team member scheduled for:  03/17/21      Consent to CCM Services: Ms. Munnerlyn was given information about Chronic Care Management services including:  CCM service includes personalized support from designated clinical staff supervised by her physician, including individualized plan of care and coordination with other care providers 24/7 contact phone numbers for assistance for urgent and routine care needs. Service will only be billed when office clinical staff spend 20 minutes or more in a month to coordinate care. Only one practitioner may furnish and bill the service in a calendar month. The patient may stop CCM services at any time (effective at the end of the month) by phone call to the office staff. The patient will be responsible for cost sharing (co-pay) of up to 20% of the service fee (after annual deductible is met).  Patient agreed to services and verbal consent obtained.   Patient verbalizes understanding of instructions provided today and agrees to view in [place].   Telephone follow up appointment with care management team member scheduled for: 03/17/21 '@11' :50 AM

## 2021-02-05 NOTE — Chronic Care Management (AMB) (Signed)
Chronic Care Management   CCM RN Visit Note  02/05/2021 Name: Jacqueline Bell MRN: [id number] DOB: 1950-10-30  Subjective: Jacqueline Bell is a 70 y.o. year old female who is a primary care patient of [name], [place]. The care management team was consulted for assistance with disease management and care coordination needs.    Engaged with patient by telephone for follow up visit in response to provider referral for case management and/or care coordination services.   Consent to Services:  The patient was given information about Chronic Care Management services, agreed to services, and gave verbal consent prior to initiation of services.  Please see initial visit note for detailed documentation.   Patient agreed to services and verbal consent obtained.   Assessment: Review of patient past medical history, allergies, medications, health status, including review of consultants reports, laboratory and other test data, was performed as part of comprehensive evaluation and provision of chronic care management services.   SDOH (Social Determinants of Health) assessments and interventions performed:  yes, no acute challenges   CCM Care Plan  Allergies  Allergen Reactions   Iodine Hives   Shellfish Allergy Hives   Tramadol Rash    Outpatient Encounter Medications as of 02/05/2021  Medication Sig Note   benzonatate (TESSALON PERLES) 100 MG capsule Take 1 capsule (100 mg total) by mouth every 6 (six) hours as needed.    cholecalciferol (VITAMIN D) 1000 units tablet Take 1,000 Units by mouth daily.    dapagliflozin propanediol (FARXIGA) 5 MG TABS tablet Take 1 tablet (5 mg total) by mouth daily before breakfast.    EPINEPHrine 0.3 mg/0.3 mL IJ SOAJ injection     fluticasone (FLONASE) 50 MCG/ACT nasal spray USE 1 SPRAY IN EACH NOSTRIL DAILY    insulin aspart (NOVOLOG FLEXPEN) 100 UNIT/ML FlexPen Per SS: < 70 = 0; 70-90 = 4 units; 91-130 = 6 units; 131-150 = 7 units; 151-200 = 8 units;  201-250 = 9 units; 251-300 = 10 units; 301-350 = 11 units; 351-400 = 12 units; 401-450 = 13 units; > 450 = 14 units.    loratadine (CLARITIN) 10 MG tablet TAKE 1 TABLET DAILY    magnesium oxide (MAG-OX) 400 MG tablet Take 400 mg by mouth daily.    meloxicam (MOBIC) 15 MG tablet Take 1 tablet (15 mg total) by mouth as needed.    Multiple Vitamin (MULTIVITAMIN) tablet Take 1 tablet by mouth daily.    Olopatadine HCl (PAZEO) 0.7 % SOLN Place 1 drop into both eyes daily. 05/19/2018: Patient is taking as needed.    Omega-3 Fatty Acids (FISH OIL PO) Take 2 capsules by mouth daily.     OZEMPIC, 1 MG/DOSE, 4 MG/3ML SOPN INJECT 1 MG UNDER THE SKIN ONCE WEEKLY    POTASSIUM BICARBONATE PO Take 1 tablet by mouth. Patient takes 2 tablets daily    pravastatin (PRAVACHOL) 80 MG tablet Take 1 tablet (80 mg total) by mouth every evening.    telmisartan (MICARDIS) 20 MG tablet TAKE 1 TABLET DAILY    TRESIBA FLEXTOUCH 100 UNIT/ML FlexTouch Pen INJECT 20 UNITS UNDER THE SKIN DAILY (Patient taking differently: Patient is taking 12-20 units under the skin daily)    No facility-administered encounter medications on file as of 02/05/2021.    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  Allergic reaction 04/04/2018   Cramp and spasm 02/24/2018   History of food allergy 02/24/2018   Type 2 diabetes mellitus without complication, with long-term current use of insulin (HCC) 02/24/2018   Insomnia 02/24/2018    Conditions to be addressed/monitored: Type 2 Diabetes, Hypertension, Insomnia, Mixed hyperlipidemia, Vitamin D deficiency  Care Plan : RN Care Manager Plan of Care  Updates made by Giovannina Mun L, RN since 02/05/2021 12:00 AM     Problem: No plan established for management of chronic disease states (Type 2  Diabetes, Hypertension, Insomnia, Mixed hyperlipidemia, Vitamin D deficiency)   Priority: High     Long-Range Goal: Development of plan of care for chronic disease management (Type 2 Diabetes, Hypertension, Insomnia, Mixed hyperlipidemia, Vitamin D deficiency)   Start Date: 02/05/2021  Expected End Date: 02/05/2022  This Visit's Progress: On track  Priority: High  Note:   Current Barriers:  Knowledge Deficits related to plan of care for management of Type 2 Diabetes, Hypertension, Insomnia, Mixed hyperlipidemia, Vitamin D deficiency  Chronic Disease Management support and education needs related to Type 2 Diabetes, Hypertension, Insomnia, Mixed hyperlipidemia, Vitamin D deficiency   RNCM Clinical Goal(s):  Patient will verbalize basic understanding of  Type 2 Diabetes, Hypertension, Insomnia, Mixed hyperlipidemia, Vitamin D deficiency disease process and self health management plan as evidenced by patient will report having no exacerbations related to chronic disease states listed above  take all medications exactly as prescribed and will call provider for medication related questions as evidenced by patient will report no missed doses of her prescribed medications  demonstrate Improved health management independence as evidenced by patient will maintain the ability to remain independent with self care  continue to work with RN Care Manager to address care management and care coordination needs related to  Type 2 Diabetes, Hypertension, Insomnia, Mixed hyperlipidemia, Vitamin D deficiency as evidenced by adherence to CM Team Scheduled appointments demonstrate ongoing self health care management ability   as evidenced by    through collaboration with RN Care manager, provider, and care team.   Interventions: 1:1 collaboration with primary care provider regarding development and update of comprehensive plan of care as evidenced by provider attestation and co-signature Inter-disciplinary care  team collaboration (see longitudinal plan of care) Evaluation of current treatment plan related to  self management and patient's adherence to plan as established by provider   Diabetes Interventions:  (Status:  Goal on track:  Yes.) Long Term Goal Assessed patient's understanding of A1c goal: <7% Provided education to patient about basic DM disease process Reviewed medications with patient and discussed importance of medication adherence Counseled on importance of regular laboratory monitoring as prescribed Provided patient with written educational materials related to hypo and hyperglycemia and importance of correct treatment Advised patient, providing education and rationale, to check cbg daily before meals and at bedtime and record, calling PCP and or RN CM for findings outside established parameters Review of patient status, including review of consultants reports, relevant laboratory and other test results, and medications completed Assessed social determinant of health barriers Discussed plans with patient for ongoing care management follow up and provided patient with direct contact information for care management team Lab Results  Component Value Date   HGBA1C 7.8 (H) 12/10/2020   Hyperlipidemia Interventions:  (Status:  Goal on track:  Yes.) Long Term Goal Medication review performed; medication list updated in electronic medical record.  Provider established cholesterol goals reviewed Counseled on importance of regular laboratory monitoring as prescribed Provided HLD educational   materials Reviewed importance of limiting foods high in cholesterol Reviewed exercise goals and target of 150 minutes per week Discussed plans with patient for ongoing care management follow up and provided patient with direct contact information for care management team  Hypertension Interventions:  (Status:  Goal on track:  Yes.) Long Term Goal Last practice recorded BP readings:  BP Readings from  Last 3 Encounters:  12/10/20 122/68  08/27/20 (!) 142/78  06/12/20 (!) 142/68  Most recent eGFR/CrCl:  Lab Results  Component Value Date   EGFR 79 12/10/2020    No components found for: CRCL  Reviewed medications with patient and discussed importance of compliance Counseled on the importance of exercise goals with target of 150 minutes per week Advised patient, providing education and rationale, to monitor blood pressure daily and record, calling PCP for findings outside established parameters Provided education on prescribed diet low Sodium  Discussed plans with patient for ongoing care management follow up and provided patient with direct contact information for care management team  Patient Goals/Self-Care Activities: Take all medications as prescribed Attend all scheduled provider appointments Call pharmacy for medication refills 3-7 days in advance of running out of medications Attend church or other social activities Perform all self care activities independently  Perform IADL's (shopping, preparing meals, housekeeping, managing finances) independently Call provider office for new concerns or questions  fill half of plate with vegetables manage portion size call doctor for signs and symptoms of high blood pressure take medications for blood pressure exactly as prescribed adhere to prescribed diet: low saturated fat  develop an exercise routine  Follow Up Plan:  Telephone follow up appointment with care management team member scheduled for:  03/17/21      Plan:Telephone follow up appointment with care management team member scheduled for:  03/17/21  Barb Merino, RN, BSN, CCM Care Management Coordinator [place] [facility]  Direct Phone: [phone]

## 2021-02-18 ENCOUNTER — Telehealth: Payer: Self-pay

## 2021-02-18 NOTE — Chronic Care Management (AMB) (Signed)
Chronic Care Management Pharmacy Assistant   Name: Jacqueline Bell  MRN: [id number] DOB: 1950-09-11  Reason for Encounter: Disease State/ Diabetes  Recent office visits:  12-06-2020 Riley Churches, RN (CCM)  12-10-2020 Arnette Felts, FNP. Glucose= 105. A1C= 7.8. START tessalon 100 mg every 6 hours as needed.  02-05-2021 Little, Karma Lew, RN (CCM)  Recent consult visits:  None  Hospital visits:  None in previous 6 months  Medications: Outpatient Encounter Medications as of 02/18/2021  Medication Sig Note   benzonatate (TESSALON PERLES) 100 MG capsule Take 1 capsule (100 mg total) by mouth every 6 (six) hours as needed.    cholecalciferol (VITAMIN D) 1000 units tablet Take 1,000 Units by mouth daily.    dapagliflozin propanediol (FARXIGA) 5 MG TABS tablet Take 1 tablet (5 mg total) by mouth daily before breakfast.    EPINEPHrine 0.3 mg/0.3 mL IJ SOAJ injection     fluticasone (FLONASE) 50 MCG/ACT nasal spray USE 1 SPRAY IN EACH NOSTRIL DAILY    insulin aspart (NOVOLOG FLEXPEN) 100 UNIT/ML FlexPen Per SS: < 70 = 0; 70-90 = 4 units; 91-130 = 6 units; 131-150 = 7 units; 151-200 = 8 units; 201-250 = 9 units; 251-300 = 10 units; 301-350 = 11 units; 351-400 = 12 units; 401-450 = 13 units; > 450 = 14 units.    loratadine (CLARITIN) 10 MG tablet TAKE 1 TABLET DAILY    magnesium oxide (MAG-OX) 400 MG tablet Take 400 mg by mouth daily.    meloxicam (MOBIC) 15 MG tablet Take 1 tablet (15 mg total) by mouth as needed.    Multiple Vitamin (MULTIVITAMIN) tablet Take 1 tablet by mouth daily.    Olopatadine HCl (PAZEO) 0.7 % SOLN Place 1 drop into both eyes daily. 05/19/2018: Patient is taking as needed.    Omega-3 Fatty Acids (FISH OIL PO) Take 2 capsules by mouth daily.     OZEMPIC, 1 MG/DOSE, 4 MG/3ML SOPN INJECT 1 MG UNDER THE SKIN ONCE WEEKLY    POTASSIUM BICARBONATE PO Take 1 tablet by mouth. Patient takes 2 tablets daily    pravastatin (PRAVACHOL) 80 MG tablet Take 1 tablet (80 mg  total) by mouth every evening.    telmisartan (MICARDIS) 20 MG tablet TAKE 1 TABLET DAILY    TRESIBA FLEXTOUCH 100 UNIT/ML FlexTouch Pen INJECT 20 UNITS UNDER THE SKIN DAILY (Patient taking differently: Patient is taking 12-20 units under the skin daily)    No facility-administered encounter medications on file as of 02/18/2021.  Recent Relevant Labs: Lab Results  Component Value Date/Time   HGBA1C 7.8 (H) 12/10/2020 12:46 PM   HGBA1C 7.6 (H) 08/27/2020 12:56 PM   MICROALBUR 10 05/17/2019 12:47 PM   MICROALBUR 30 05/05/2018 01:12 PM    Kidney Function Lab Results  Component Value Date/Time   CREATININE 0.81 12/10/2020 12:46 PM   CREATININE 0.81 08/27/2020 12:56 PM   GFRNONAA 84 02/22/2020 12:11 PM   GFRAA 96 02/22/2020 12:11 PM     02-18-2021: 1st attempt left VM 02-19-2021: 2nd attempt left VM 02-21-2021: 3rd attempt left VM  Care Gaps: Shingrix overdue Covid booster overdue Yearly ophthalmology overdue AWV 10-22-2021  Star Rating Drugs: Farxiga 5 mg- Last filled 12-12-2020 [facility] Pravastatin 40 mg- Last filled 12-12-2020 [facility] Telmisartan 20 mg- Last filled 09-23-2020 [facility] ([facility] and was informed that medication was last filled 09-23-2020) Ozempic 1 mg- Last filled 12-13-2020 [facility]   Huey Romans CMA  Clinical Pharmacist Assistant [phone]

## 2021-02-26 ENCOUNTER — Telehealth: Payer: Self-pay

## 2021-02-26 NOTE — Progress Notes (Signed)
° ° °  Chronic Care Management Pharmacy Assistant   Name: KIMA MALENFANT  MRN: [id number] DOB: 02-17-1951    Reason for Encounter: Called patient left message regarding appointment 02/27/21.   Medications: Outpatient Encounter Medications as of 02/26/2021  Medication Sig Note   benzonatate (TESSALON PERLES) 100 MG capsule Take 1 capsule (100 mg total) by mouth every 6 (six) hours as needed.    cholecalciferol (VITAMIN D) 1000 units tablet Take 1,000 Units by mouth daily.    dapagliflozin propanediol (FARXIGA) 5 MG TABS tablet Take 1 tablet (5 mg total) by mouth daily before breakfast.    EPINEPHrine 0.3 mg/0.3 mL IJ SOAJ injection     fluticasone (FLONASE) 50 MCG/ACT nasal spray USE 1 SPRAY IN EACH NOSTRIL DAILY    insulin aspart (NOVOLOG FLEXPEN) 100 UNIT/ML FlexPen Per SS: < 70 = 0; 70-90 = 4 units; 91-130 = 6 units; 131-150 = 7 units; 151-200 = 8 units; 201-250 = 9 units; 251-300 = 10 units; 301-350 = 11 units; 351-400 = 12 units; 401-450 = 13 units; > 450 = 14 units.    loratadine (CLARITIN) 10 MG tablet TAKE 1 TABLET DAILY    magnesium oxide (MAG-OX) 400 MG tablet Take 400 mg by mouth daily.    meloxicam (MOBIC) 15 MG tablet Take 1 tablet (15 mg total) by mouth as needed.    Multiple Vitamin (MULTIVITAMIN) tablet Take 1 tablet by mouth daily.    Olopatadine HCl (PAZEO) 0.7 % SOLN Place 1 drop into both eyes daily. 05/19/2018: Patient is taking as needed.    Omega-3 Fatty Acids (FISH OIL PO) Take 2 capsules by mouth daily.     OZEMPIC, 1 MG/DOSE, 4 MG/3ML SOPN INJECT 1 MG UNDER THE SKIN ONCE WEEKLY    POTASSIUM BICARBONATE PO Take 1 tablet by mouth. Patient takes 2 tablets daily    pravastatin (PRAVACHOL) 80 MG tablet Take 1 tablet (80 mg total) by mouth every evening.    telmisartan (MICARDIS) 20 MG tablet TAKE 1 TABLET DAILY    TRESIBA FLEXTOUCH 100 UNIT/ML FlexTouch Pen INJECT 20 UNITS UNDER THE SKIN DAILY (Patient taking differently: Patient is taking 12-20 units under the skin  daily)    No facility-administered encounter medications on file as of 02/26/2021.   Doreen Beam Clinical Pharmacist Assistant [phone]

## 2021-02-27 ENCOUNTER — Ambulatory Visit (INDEPENDENT_AMBULATORY_CARE_PROVIDER_SITE_OTHER): Payer: Medicare Other

## 2021-02-27 DIAGNOSIS — E782 Mixed hyperlipidemia: Secondary | ICD-10-CM

## 2021-02-27 DIAGNOSIS — E119 Type 2 diabetes mellitus without complications: Secondary | ICD-10-CM

## 2021-02-27 NOTE — Patient Instructions (Signed)
Visit Information It was great speaking with you today!  Please let me know if you have any questions about our visit.   Goals Addressed             This Visit's Progress    Manage My Medicine       Timeframe:  Long-Range Goal Priority:  High Start Date:                             Expected End Date:                       Follow Up Date 04/18/2021   - call for medicine refill 2 or 3 days before it runs out - call if I am sick and can't take my medicine - keep a list of all the medicines I take; vitamins and herbals too - learn to read medicine labels - use an alarm clock or phone to remind me to take my medicine    Why is this important?   These steps will help you keep on track with your medicines.   Notes:  Please call if you have any questions.        Patient Care Plan: CCM Pharmacy Care Plan     Problem Identified: DM, HYPERLIPIDEMIA      Goal: Disease Management   This Visit's Progress: On track  Recent Progress: On track  Priority: High  Note:    Current Barriers:  Unable to independently monitor therapeutic efficacy Unable to achieve control of cholesterol and diabetes    Pharmacist Clinical Goal(s):  Patient will achieve adherence to monitoring guidelines and medication adherence to achieve therapeutic efficacy achieve improvement in cholesterol and diabetes as evidenced by A1c decrease, and LDL <70 through collaboration with PharmD and provider.   Interventions: 1:1 collaboration with Arnette Felts, FNP regarding development and update of comprehensive plan of care as evidenced by provider attestation and co-signature Inter-disciplinary care team collaboration (see longitudinal plan of care) Comprehensive medication review performed; medication list updated in electronic medical record  Diabetes (A1c goal <7%) -Uncontrolled -Current medications: Ozempic 1 mg once a week on Monday  Farxiga 5 mg tablet daily Novolog Sliding scale Tresiba 12-20  units under the skin daily -Current home glucose readings fasting glucose: 124-127 post prandial glucose: 140 over the last 70 days -Denies hypoglycemic/hyperglycemic symptoms -Current meal patterns:  breakfast: very seldom has breakfast - she sometimes has more of a brunch sometimes a cup of oatmeal with raisins, or sliced fruit lunch: fresh green salad  dinner: She sometimes has ribs, with rice sometimes a calzone.  drinks: she has increased the amount of water she is drinking to at night, because she does not like to go back and forth to the restroom at work. She drinks at least 2 bottles of 33 ounces alkaline plus or smart water, and an additional 3-4 of the 8, 12 or 16.9 ounces of water.  -Current exercise: patient reports that she has not been exercising. Recommended that patient start using her smart watch to record steps.  -She is going to try walking at least 5000 steps per day  -Educated on Complications of diabetes including kidney damage, retinal damage, and cardiovascular disease; Exercise goal of 150 minutes per week; Carbohydrate counting and/or plate method -Counseled to check feet daily and get yearly eye exams -Discussed with patient that I would like to increase her Ozempic injection or Farxiga tablet.  Ms. Simic reports that Ozempic would cost more than [name] so she would be okay with increasing this medication. -Collaborated with PCP team recommend patients [name] be increased to 10 mg tablet daily pending A1c results from January visit.   Hyperlipidemia: (LDL goal < 70) -Not ideally controlled -Current treatment: Pravastatin 80 mg tablet once per day  -Current dietary patterns: she does not eat a lot of fried and fatty foods.  -Current exercise habits: we have set a goal for at least 5000 steps per day to be recorded on her pedometer -Educated on Cholesterol goals;  Benefits of statin for ASCVD risk reduction; -Recommended to continue current medication   Patient  Goals/Self-Care Activities Patient will:  - take medications as prescribed as evidenced by patient report and record review  Follow Up Plan: The patient has been provided with contact information for the care management team and has been advised to call with any health related questions or concerns.        Patient agreed to services and verbal consent obtained.   The patient verbalized understanding of instructions, educational materials, and care plan provided today and agreed to receive a mailed copy of patient instructions, educational materials, and care plan.   Cherylin Mylar, PharmD Clinical Pharmacist [facility] [phone]

## 2021-02-27 NOTE — Progress Notes (Signed)
Chronic Care Management Pharmacy Note  02/27/2021 Name:  Jacqueline Bell MRN:  [id number] DOB:  07/05/1950  Summary: Patient reports that she is doing well. She is taking her Pravastatin 80 mg tablet daily.   Recommendations/Changes made from today's visit: Recommend patient have labs completed to detemine if her cholesterol is continuing to come down. Recommend patients appointment be changed from 03/13/2021 because she will be out of town.   Plan: Patient to have lipid panel completed and A1c done during next office visit on 03/16/2021. She is going to start to use her smart watches to record how many steps she takes per day.    Subjective: Jacqueline Bell is an 70 y.o. year old female who is a primary patient of [name], [place].  The CCM team was consulted for assistance with disease management and care coordination needs.    Engaged with patient by telephone for follow up visit in response to provider referral for pharmacy case management and/or care coordination services. Patient reports that she is still celebrating her birthday. She does not get home from work until 9-9:30 pm at night. Patient reports that her husband is in [place] currently.  She drives from [place] to work and to her mom she is doing well. She is blessed and can't complain.   Consent to Services:  The patient was given information about Chronic Care Management services, agreed to services, and gave verbal consent prior to initiation of services.  Please see initial visit note for detailed documentation.   Patient Care Team: [name], FNP as PCP - General (General Practice) Warden Fillers, MD as Consulting Physician (Ophthalmology) Rex Kras Claudette Stapler, RN as Case Manager  Recent office visits: 12/10/2020 PCP OV   Recent consult visits: None noted in the past 6 months   Hospital visits: None in previous 6 months   Objective:  Lab Results  Component Value Date   CREATININE 0.81 12/10/2020    BUN 11 12/10/2020   GFRNONAA 84 02/22/2020   GFRAA 96 02/22/2020   NA 139 12/10/2020   K 4.8 12/10/2020   CALCIUM 9.2 12/10/2020   CO2 24 12/10/2020   GLUCOSE 105 (H) 12/10/2020    Lab Results  Component Value Date/Time   HGBA1C 7.8 (H) 12/10/2020 12:46 PM   HGBA1C 7.6 (H) 08/27/2020 12:56 PM   MICROALBUR 10 05/17/2019 12:47 PM   MICROALBUR 30 05/05/2018 01:12 PM    Last diabetic Eye exam:  Lab Results  Component Value Date/Time   HMDIABEYEEXA Retinopathy (A) 06/09/2017 12:00 AM    Last diabetic Foot exam: No results found for: HMDIABFOOTEX   Lab Results  Component Value Date   CHOL 169 12/10/2020   HDL 53 12/10/2020   LDLCALC 99 12/10/2020   TRIG 95 12/10/2020   CHOLHDL 3.2 12/10/2020    Hepatic Function Latest Ref Rng & Units 12/10/2020 08/27/2020 05/23/2020  Total Protein 6.0 - 8.5 g/dL 7.8 7.6 7.7  Albumin 3.8 - 4.8 g/dL 4.6 4.6 4.7  AST 0 - 40 IU/L _0 ALT 0 - 32 IU/L _1 Alk Phosphatase 44 - 121 IU/L 54 59 48  Total Bilirubin 0.0 - 1.2 mg/dL 0.5 0.3 0.6    Lab Results  Component Value Date/Time   TSH 0.482 05/05/2018 02:14 PM    CBC Latest Ref Rng & Units 08/27/2020 04/21/2008  WBC 3.4 - 10.8 x10E3/uL 4.0 4.2  Hemoglobin 11.1 - 15.9 g/dL 12.9 12.4  Hematocrit 34.0 - 46.6 %  38.2 36.6  Platelets 150 - 450 x10E3/uL 275 236    Lab Results  Component Value Date/Time   VD25OH 32.2 12/10/2020 12:46 PM   VD25OH 46.6 11/23/2019 02:26 PM    Clinical ASCVD: No  The 10-year ASCVD risk score (Arnett DK, et al., 2019) is: 36%   Values used to calculate the score:     Age: 70 years     Sex: Female     Is Non-Hispanic African American: Yes     Diabetic: Yes     Tobacco smoker: Yes     Systolic Blood Pressure: 161 mmHg     Is BP treated: Yes     HDL Cholesterol: 53 mg/dL     Total Cholesterol: 169 mg/dL    Depression screen [facility] 2/9 09/25/2020 05/23/2020 05/17/2019  Decreased Interest 0 0 0  Down, Depressed, Hopeless 0 0 0  PHQ - 2 Score 0 0 0   Altered sleeping - - 3  Tired, decreased energy - - 0  Change in appetite - - 0  Feeling bad or failure about yourself  - - 0  Trouble concentrating - - 0  Moving slowly or fidgety/restless - - 0  Suicidal thoughts - - 0  PHQ-9 Score - - 3  Difficult doing work/chores - - Not difficult at all    Social History   Tobacco Use  Smoking Status Never  Smokeless Tobacco Never   BP Readings from Last 3 Encounters:  12/10/20 122/68  08/27/20 (!) 142/78  06/12/20 (!) 142/68   Pulse Readings from Last 3 Encounters:  12/10/20 80  08/27/20 78  05/23/20 81   Wt Readings from Last 3 Encounters:  12/10/20 185 lb 3.2 oz (84 kg)  09/25/20 183 lb (83 kg)  08/27/20 191 lb 3.2 oz (86.7 kg)   BMI Readings from Last 3 Encounters:  12/10/20 28.16 kg/m  09/25/20 27.83 kg/m  08/27/20 29.95 kg/m    Assessment/Interventions: Review of patient past medical history, allergies, medications, health status, including review of consultants reports, laboratory and other test data, was performed as part of comprehensive evaluation and provision of chronic care management services.   SDOH:  (Social Determinants of Health) assessments and interventions performed: No  SDOH Screenings   Alcohol Screen: Not on file  Depression (PHQ2-9): Low Risk    PHQ-2 Score: 0  Financial Resource Strain: Low Risk    Difficulty of Paying Living Expenses: Not hard at all  Food Insecurity: No Food Insecurity   Worried About Charity fundraiser in the Last Year: Never true   Ran Out of Food in the Last Year: Never true  Housing: Not on file  Physical Activity: Insufficiently Active   Days of Exercise per Week: 3 days   Minutes of Exercise per Session: 20 min  Social Connections: Not on file  Stress: No Stress Concern Present   Feeling of Stress : Not at all  Tobacco Use: Low Risk    Smoking Tobacco Use: Never   Smokeless Tobacco Use: Never   Passive Exposure: Not on file  Transportation Needs: No  Transportation Needs   Lack of Transportation (Medical): No   Lack of Transportation (Non-Medical): No    CCM Care Plan  Allergies  Allergen Reactions   Iodine Hives   Shellfish Allergy Hives   Tramadol Rash    Medications Reviewed Today     Reviewed by Lynne Logan, RN (Registered Nurse) on 02/05/21 at [place] List Status: <None>  Medication Order Taking? Sig Documenting Provider Last Dose Status Informant  benzonatate (TESSALON PERLES) 100 MG capsule [id number]  Take 1 capsule (100 mg total) by mouth every 6 (six) hours as needed. [name], FNP  Active   cholecalciferol (VITAMIN D) 1000 units tablet [id number] No Take 1,000 Units by mouth daily. [provider] Taking Active   dapagliflozin propanediol (FARXIGA) 5 MG TABS tablet [id number] No Take 1 tablet (5 mg total) by mouth daily before breakfast. [name], FNP Taking Active   EPINEPHrine 0.3 mg/0.3 mL IJ SOAJ injection [id number] No  [provider] Taking Active   fluticasone (FLONASE) 50 MCG/ACT nasal spray [id number] No USE 1 SPRAY IN EACH NOSTRIL DAILY Bobbitt, [place] Muta, MD Taking Active   insulin aspart (NOVOLOG FLEXPEN) 100 UNIT/ML FlexPen [id number] No Per SS: < 70 = 0; 70-90 = 4 units; 91-130 = 6 units; 131-150 = 7 units; 151-200 = 8 units; 201-250 = 9 units; 251-300 = 10 units; 301-350 = 11 units; 351-400 = 12 units; 401-450 = 13 units; > 450 = 14 units. [name], FNP Taking Active   loratadine (CLARITIN) 10 MG tablet [id number] No TAKE 1 TABLET DAILY [name], FNP Taking Active   magnesium oxide (MAG-OX) 400 MG tablet [id number] No Take 400 mg by mouth daily. [provider] Taking Active Self  meloxicam (MOBIC) 15 MG tablet [id number]  Take 1 tablet (15 mg total) by mouth as needed. [name], FNP  Active   Multiple Vitamin (MULTIVITAMIN) tablet [id number] No Take 1 tablet by mouth daily. [provider] Taking Active   Olopatadine HCl (PAZEO) 0.7 % SOLN  [id number] No Place 1 drop into both eyes daily. Bobbitt, [place] Muta, MD Taking Active            Med Note (LITTLE, Simeon Craft May 19, 2018 11:45 AM) Patient is taking as needed.   Omega-3 Fatty Acids (FISH OIL PO) [id number] No Take 2 capsules by mouth daily.  [provider] Taking Active   OZEMPIC, 1 MG/DOSE, 4 MG/3ML SOPN [id number]  INJECT 1 MG UNDER THE SKIN ONCE WEEKLY [name], FNP  Active   POTASSIUM BICARBONATE PO [id number] No Take 1 tablet by mouth. Patient takes 2 tablets daily [provider] Taking Active   pravastatin (PRAVACHOL) 80 MG tablet [id number] No Take 1 tablet (80 mg total) by mouth every evening. 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   Allergic reaction 04/04/2018   Cramp and spasm 02/24/2018   History of food allergy 02/24/2018   Type 2 diabetes mellitus without complication, with long-term current use of insulin ([place]) 02/24/2018   Insomnia 02/24/2018    Immunization History  Administered Date(s) Administered   Fluad Quad(high Dose 65+) 11/23/2019   IPV 08/28/1996   Influenza, High Dose Seasonal PF 11/28/2018   Influenza-Unspecified 11/18/2017   PFIZER(Purple Top)SARS-COV-2 Vaccination 04/07/2019, 05/01/2019, 01/17/2020  PNEUMOCOCCAL CONJUGATE-20 08/28/2020    Conditions to be addressed/monitored:  Hyperlipidemia and Diabetes  Care Plan : [place]  Updates  made by Mayford Knife, RPH since 02/27/2021 12:00 AM     Problem: DM, HYPERLIPIDEMIA      Goal: Disease Management   This Visit's Progress: On track  Recent Progress: On track  Priority: High  Note:    Current Barriers:  Unable to independently monitor therapeutic efficacy Unable to achieve control of cholesterol and diabetes    Pharmacist Clinical Goal(s):  Patient will achieve adherence to monitoring guidelines and medication adherence to achieve therapeutic efficacy achieve improvement in cholesterol and diabetes as evidenced by A1c decrease, and LDL <70 through collaboration with PharmD and provider.   Interventions: 1:1 collaboration with [name], FNP regarding development and update of comprehensive plan of care as evidenced by provider attestation and co-signature Inter-disciplinary care team collaboration (see longitudinal plan of care) Comprehensive medication review performed; medication list updated in electronic medical record  Diabetes (A1c goal <7%) -Uncontrolled -Current medications: Ozempic 1 mg once a week on Monday  Farxiga 5 mg tablet daily Novolog Sliding scale Tresiba 12-20 units under the skin daily -Current home glucose readings fasting glucose: 124-127 post prandial glucose: 140 over the last 70 days -Denies hypoglycemic/hyperglycemic symptoms -Current meal patterns:  breakfast: very seldom has breakfast - she sometimes has more of a brunch sometimes a cup of oatmeal with raisins, or sliced fruit lunch: fresh green salad  dinner: She sometimes has ribs, with rice sometimes a calzone.  drinks: she has increased the amount of water she is drinking to at night, because she does not like to go back and forth to the restroom at work. She drinks at least 2 bottles of 33 ounces alkaline plus or smart water, and an additional 3-4 of the 8, 12 or 16.9 ounces of water.  -Current exercise: patient reports that she has not been exercising. Recommended  that patient start using her smart watch to record steps.  -She is going to try walking at least 5000 steps per day  -Educated on Complications of diabetes including kidney damage, retinal damage, and cardiovascular disease; Exercise goal of 150 minutes per week; Carbohydrate counting and/or plate method -Counseled to check feet daily and get yearly eye exams -Discussed with patient that I would like to increase her Ozempic injection or Farxiga tablet. Ms. Ford reports that Keyser would cost more than Wilder Glade so she would be okay with increasing this medication. -Collaborated with PCP team recommend patients Wilder Glade be increased to 10 mg tablet daily pending A1c results from January visit.   Hyperlipidemia: (LDL goal < 70) -Not ideally controlled -Current treatment: Pravastatin 80 mg tablet once per day  -Current dietary patterns: she does not eat a lot of fried and fatty foods.  -Current exercise habits: we have set a goal for at least 5000 steps per day to be recorded on her pedometer -Educated on Cholesterol goals;  Benefits of statin for ASCVD risk reduction; -Recommended to continue current medication   Patient Goals/Self-Care Activities Patient will:  - take medications as prescribed as evidenced by patient report and record review  Follow Up Plan: The patient has been provided with contact information for the care management team and has been advised to call with any health related questions or concerns.       Medication Assistance: None required.  Patient affirms current coverage meets needs.  Compliance/Adherence/Medication fill history: Care Gaps: Shingrix Vaccine  Ophthalmology  Exam  COVID-19 Vaccine - 01/24/2021 received  Foot Exam - patient had a foot exam when she saw Janece   Star-Rating Drugs: Pravastatin 80 mg tablet  Ozempic 1 mg   Patient's preferred pharmacy is:  [facility] - [place], [place] [address] [place] [address] Phone: [phone] Fax: [phone]  [facility] [phone] - [place], [place] - [address] [place] [address] Phone: [phone] Fax: [phone]  [facility] [place], [place] [address] [place] [address] Phone: [phone] Fax: [phone]  Uses pill box? Yes Pt endorses 95% compliance  We discussed: Current pharmacy is preferred with insurance plan and patient is satisfied with pharmacy services Patient decided to: Continue current medication management strategy  Care Plan and Follow Up Patient Decision:  Patient agrees to Care Plan and Follow-up.  Plan: The patient has been provided with contact information for the care management team and has been advised to call with any health related questions or concerns.   [name], CPP, PharmD Clinical Pharmacist Practitioner [facility] [phone]

## 2021-03-08 DIAGNOSIS — E1169 Type 2 diabetes mellitus with other specified complication: Secondary | ICD-10-CM | POA: Diagnosis not present

## 2021-03-08 DIAGNOSIS — E785 Hyperlipidemia, unspecified: Secondary | ICD-10-CM

## 2021-03-08 DIAGNOSIS — Z794 Long term (current) use of insulin: Secondary | ICD-10-CM | POA: Diagnosis not present

## 2021-03-08 DIAGNOSIS — Z7984 Long term (current) use of oral hypoglycemic drugs: Secondary | ICD-10-CM

## 2021-03-13 ENCOUNTER — Ambulatory Visit: Payer: Medicare Other | Admitting: Nurse Practitioner

## 2021-03-17 ENCOUNTER — Telehealth: Payer: Medicare Other

## 2021-03-17 ENCOUNTER — Ambulatory Visit (INDEPENDENT_AMBULATORY_CARE_PROVIDER_SITE_OTHER): Payer: Medicare Other

## 2021-03-17 DIAGNOSIS — E782 Mixed hyperlipidemia: Secondary | ICD-10-CM

## 2021-03-17 DIAGNOSIS — I10 Essential (primary) hypertension: Secondary | ICD-10-CM

## 2021-03-17 DIAGNOSIS — E119 Type 2 diabetes mellitus without complications: Secondary | ICD-10-CM

## 2021-03-17 DIAGNOSIS — E559 Vitamin D deficiency, unspecified: Secondary | ICD-10-CM

## 2021-03-17 NOTE — Patient Instructions (Signed)
Visit Information  Thank you for taking time to visit with me today. Please don't hesitate to contact me if I can be of assistance to you before our next scheduled telephone appointment.  Following are the goals we discussed today:  (Copy and paste patient goals from clinical care plan here)  Our next appointment is by telephone on 05/15/21 at 10:30 AM  Please call the care guide team at [phone] if you need to cancel or reschedule your appointment.   If you are experiencing a Mental Health or [place] or need someone to talk to, please call 1-800-273-TALK (toll free, 24 hour hotline)   Patient verbalizes understanding of instructions provided today and agrees to view in [place].   Barb Merino, RN, BSN, CCM Care Management Coordinator [facility]  Direct Phone: [phone]

## 2021-03-17 NOTE — Chronic Care Management (AMB) (Signed)
°Chronic Care Management  ° °CCM RN Visit Note ° °03/17/2021 °Name: Jacqueline Bell MRN: [id number] DOB: 09/20/1950 ° °Subjective: °Jacqueline Bell is a 70 y.o. year old female who is a primary care patient of Moore, Janece, FNP. The care management team was consulted for assistance with disease management and care coordination needs.   ° °Engaged with patient by telephone for follow up visit in response to provider referral for case management and/or care coordination services.  ° °Consent to Services:  °The patient was given information about Chronic Care Management services, agreed to services, and gave verbal consent prior to initiation of services.  Please see initial visit note for detailed documentation.  ° °Patient agreed to services and verbal consent obtained.  ° °Assessment: Review of patient past medical history, allergies, medications, health status, including review of consultants reports, laboratory and other test data, was performed as part of comprehensive evaluation and provision of chronic care management services.  ° °SDOH (Social Determinants of Health) assessments and interventions performed:  Yes, no acute challenges ° °CCM Care Plan ° °Allergies  °Allergen Reactions  ° Iodine Hives  ° Shellfish Allergy Hives  ° Tramadol Rash  ° ° °Outpatient Encounter Medications as of 03/17/2021  °Medication Sig Note  ° benzonatate (TESSALON PERLES) 100 MG capsule Take 1 capsule (100 mg total) by mouth every 6 (six) hours as needed.   ° cholecalciferol (VITAMIN D) 1000 units tablet Take 1,000 Units by mouth daily.   ° dapagliflozin propanediol (FARXIGA) 5 MG TABS tablet Take 1 tablet (5 mg total) by mouth daily before breakfast.   ° EPINEPHrine 0.3 mg/0.3 mL IJ SOAJ injection    ° fluticasone (FLONASE) 50 MCG/ACT nasal spray USE 1 SPRAY IN EACH NOSTRIL DAILY   ° insulin aspart (NOVOLOG FLEXPEN) 100 UNIT/ML FlexPen Per SS: < 70 = 0; 70-90 = 4 units; 91-130 = 6 units; 131-150 = 7 units; 151-200 = 8 units; 201-250  = 9 units; 251-300 = 10 units; 301-350 = 11 units; 351-400 = 12 units; 401-450 = 13 units; > 450 = 14 units.   ° loratadine (CLARITIN) 10 MG tablet TAKE 1 TABLET DAILY   ° magnesium oxide (MAG-OX) 400 MG tablet Take 400 mg by mouth daily.   ° meloxicam (MOBIC) 15 MG tablet Take 1 tablet (15 mg total) by mouth as needed.   ° Multiple Vitamin (MULTIVITAMIN) tablet Take 1 tablet by mouth daily.   ° Olopatadine HCl (PAZEO) 0.7 % SOLN Place 1 drop into both eyes daily. 05/19/2018: Patient is taking as needed.   ° Omega-3 Fatty Acids (FISH OIL PO) Take 2 capsules by mouth daily.    ° OZEMPIC, 1 MG/DOSE, 4 MG/3ML SOPN INJECT 1 MG UNDER THE SKIN ONCE WEEKLY   ° POTASSIUM BICARBONATE PO Take 1 tablet by mouth. Patient takes 2 tablets daily   ° pravastatin (PRAVACHOL) 80 MG tablet Take 1 tablet (80 mg total) by mouth every evening.   ° telmisartan (MICARDIS) 20 MG tablet TAKE 1 TABLET DAILY   ° TRESIBA FLEXTOUCH 100 UNIT/ML FlexTouch Pen INJECT 20 UNITS UNDER THE SKIN DAILY (Patient taking differently: Patient is taking 12-20 units under the skin daily)   ° °No facility-administered encounter medications on file as of 03/17/2021.  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  °   Allergic reaction 04/04/2018  ° Cramp and spasm 02/24/2018  ° History of food allergy 02/24/2018  ° Type 2 diabetes mellitus without complication, with long-term current use of insulin (HCC) 02/24/2018  ° Insomnia 02/24/2018  ° ° °Conditions to be addressed/monitored: Type 2 Diabetes, Hypertension, Insomnia, Mixed hyperlipidemia, Vitamin D deficiency ° °Care Plan : RN Care Manager Plan of Care  °Updates made by Little, Angel L, RN since 03/17/2021 12:00 AM  °  ° °Problem: No plan established for management of chronic disease states (Type 2 Diabetes,  Hypertension, Insomnia, Mixed hyperlipidemia, Vitamin D deficiency)   °Priority: High  °  ° °Long-Range Goal: Development of plan of care for chronic disease management (Type 2 Diabetes, Hypertension, Insomnia, Mixed hyperlipidemia, Vitamin D deficiency)   °Start Date: 02/05/2021  °Expected End Date: 02/05/2022  °Recent Progress: On track  °Priority: High  °Note:   °Current Barriers:  °Knowledge Deficits related to plan of care for management of Type 2 Diabetes, Hypertension, Insomnia, Mixed hyperlipidemia, Vitamin D deficiency  °Chronic Disease Management support and education needs related to Type 2 Diabetes, Hypertension, Insomnia, Mixed hyperlipidemia, Vitamin D deficiency  ° °RNCM Clinical Goal(s):  °Patient will verbalize basic understanding of  Type 2 Diabetes, Hypertension, Insomnia, Mixed hyperlipidemia, Vitamin D deficiency disease process and self health management plan as evidenced by patient will report having no exacerbations related to chronic disease states listed above  °take all medications exactly as prescribed and will call provider for medication related questions as evidenced by patient will report no missed doses of her prescribed medications  °demonstrate Improved health management independence as evidenced by patient will maintain the ability to remain independent with self care  °continue to work with RN Care Manager to address care management and care coordination needs related to  Type 2 Diabetes, Hypertension, Insomnia, Mixed hyperlipidemia, Vitamin D deficiency as evidenced by adherence to CM Team Scheduled appointments °demonstrate ongoing self health care management ability   as evidenced by    through collaboration with RN Care manager, provider, and care team.  ° °Interventions: °1:1 collaboration with primary care provider regarding development and update of comprehensive plan of care as evidenced by provider attestation and co-signature °Inter-disciplinary care team collaboration  (see longitudinal plan of care) °Evaluation of current treatment plan related to  self management and patient's adherence to plan as established by provider ° °Diabetes Interventions:  (Status:  Goal on track:  Yes.) Long Term Goal °Assessed patient's understanding of A1c goal: <7% °Reviewed medications with patient and discussed importance of medication adherence °Counseled on importance of regular laboratory monitoring as prescribed °Provided patient with written educational materials related to hypo and hyperglycemia and importance of correct treatment °Advised patient, providing education and rationale, to check cbg daily before meals and at bedtime and record, calling PCP and or RN CM for findings outside established parameters °Review of patient status, including review of consultants reports, relevant laboratory and other test results, and medications completed °Reviewed scheduled/upcoming provider appointments including: upcoming PCP follow up scheduled for 03/18/21 @3:20 PM °Discussed plans with patient for ongoing care management follow up and provided patient with direct contact information for care management team °Lab Results  °Component Value Date  ° HGBA1C 7.8 (H) 12/10/2020  ° °Hyperlipidemia Interventions:  (Status:  Condition stable.  Not addressed this visit.) Long Term Goal °Medication review performed; medication list updated in electronic medical record.  °Provider established cholesterol goals reviewed °Counseled on importance of regular laboratory monitoring as prescribed °Provided HLD educational materials °  Reviewed importance of limiting foods high in cholesterol Reviewed exercise goals and target of 150 minutes per week Discussed plans with patient for ongoing care management follow up and provided patient with direct contact information for care management team  Hypertension Interventions:  (Status:  Goal on track:  Yes.) Long Term Goal Last practice recorded BP readings:  BP  Readings from Last 3 Encounters:  12/10/20 122/68  08/27/20 (!) 142/78  06/12/20 (!) 142/68  Most recent eGFR/CrCl:  Lab Results  Component Value Date   EGFR 79 12/10/2020    No components found for: CRCL  Reviewed medications with patient and discussed importance of compliance Counseled on the importance of exercise goals with target of 150 minutes per week Advised patient, providing education and rationale, to monitor blood pressure daily and record, calling PCP for findings outside established parameters Mailed printed educational materials related to How to Accurately Monitor BP at Home Reviewed scheduled/upcoming provider appointments including: upcoming PCP follow up scheduled on 03/18/21 _0 :20 PM Discussed plans with patient for ongoing care management follow up and provided patient with direct contact information for care management team  Patient Goals/Self-Care Activities: Take all medications as prescribed Attend all scheduled provider appointments Call pharmacy for medication refills 3-7 days in advance of running out of medications Attend church or other social activities Perform all self care activities independently  Perform IADL's (shopping, preparing meals, housekeeping, managing finances) independently Call provider office for new concerns or questions  fill half of plate with vegetables manage portion size check blood pressure 3 times per week keep a blood pressure log take blood pressure log to all doctor appointments call doctor for signs and symptoms of high blood pressure take medications for blood pressure exactly as prescribed report new symptoms to your doctor adhere to prescribed diet: low saturated fat, low Cholesterol   develop an exercise routine  Follow Up Plan:  Telephone follow up appointment with care management team member scheduled for:  05/15/21      Plan:Telephone follow up appointment with care management team member scheduled for:   05/15/21  Barb Merino, RN, BSN, CCM Care Management Coordinator [facility]  Direct Phone: [phone]

## 2021-03-18 ENCOUNTER — Other Ambulatory Visit: Payer: Self-pay

## 2021-03-18 ENCOUNTER — Ambulatory Visit (INDEPENDENT_AMBULATORY_CARE_PROVIDER_SITE_OTHER): Payer: Medicare Other | Admitting: Nurse Practitioner

## 2021-03-18 ENCOUNTER — Encounter: Payer: Self-pay | Admitting: Nurse Practitioner

## 2021-03-18 VITALS — BP 130/70 | HR 78 | Temp 98.1°F | Ht 64.0 in | Wt 182.6 lb

## 2021-03-18 DIAGNOSIS — I10 Essential (primary) hypertension: Secondary | ICD-10-CM

## 2021-03-18 DIAGNOSIS — E559 Vitamin D deficiency, unspecified: Secondary | ICD-10-CM | POA: Diagnosis not present

## 2021-03-18 DIAGNOSIS — E782 Mixed hyperlipidemia: Secondary | ICD-10-CM

## 2021-03-18 DIAGNOSIS — E1169 Type 2 diabetes mellitus with other specified complication: Secondary | ICD-10-CM | POA: Diagnosis not present

## 2021-03-18 DIAGNOSIS — Z794 Long term (current) use of insulin: Secondary | ICD-10-CM

## 2021-03-18 DIAGNOSIS — E119 Type 2 diabetes mellitus without complications: Secondary | ICD-10-CM | POA: Diagnosis not present

## 2021-03-18 DIAGNOSIS — Z6831 Body mass index (BMI) 31.0-31.9, adult: Secondary | ICD-10-CM

## 2021-03-18 DIAGNOSIS — E6609 Other obesity due to excess calories: Secondary | ICD-10-CM

## 2021-03-18 NOTE — Patient Instructions (Signed)

## 2021-03-18 NOTE — Progress Notes (Signed)
I,[name],acting as a Education administrator for [employer], NP.,have documented all relevant documentation on the behalf of [employer], NP,as directed by  Bary Castilla, NP while in the presence of Bary Castilla, NP.  This visit occurred during the SARS-CoV-2 public health emergency.  Safety protocols were in place, including screening questions prior to the visit, additional usage of staff PPE, and extensive cleaning of exam room while observing appropriate contact time as indicated for disinfecting solutions.  Subjective:     Patient ID: Jacqueline Bell , female    DOB: 08-02-1950 , 71 y.o.   MRN: [id number]   Chief Complaint  Patient presents with   Diabetes    HPI  The patient is here for a follow-up on her diabetes, blood pressure, and vitamin d.   She has been checking her BS at home. It has been 130s.  Diet: She is working on her diet.  Exercise: she is walking  She will schedule for a vision test.   Diabetes She presents for her follow-up diabetic visit. She has type 2 diabetes mellitus. Her disease course has been stable. Pertinent negatives for hypoglycemia include no confusion, dizziness or headaches. There are no diabetic associated symptoms. Pertinent negatives for diabetes include no chest pain, no fatigue, no polydipsia, no polyphagia, no polyuria and no weakness. There are no hypoglycemic complications. Symptoms are stable. There are no diabetic complications. Risk factors for coronary artery disease include sedentary lifestyle and obesity. Current diabetic treatment includes oral agent (dual therapy). She is compliant with treatment all of the time. Her weight is stable. She is following a generally healthy diet. When asked about meal planning, she reported none. She has not had a previous visit with a dietitian. She rarely participates in exercise. There is no change in her home blood glucose trend. (Last month she had 3 lows - 49, 50 and 60's. Drank some juice  and peanut butter crackers. She does remember going to a conference and not eating as much. She did not have any symptoms. ) An ACE inhibitor/angiotensin II receptor blocker is being taken. She does not see a podiatrist.Eye exam is not current.  Hypertension This is a chronic problem. The current episode started more than 1 year ago. The problem is unchanged. The problem is uncontrolled. Pertinent negatives include no anxiety, chest pain, headaches, palpitations or shortness of breath. Risk factors for coronary artery disease include obesity, sedentary lifestyle and diabetes mellitus. Past treatments include ACE inhibitors. The current treatment provides mild improvement. There are no compliance problems.  There is no history of angina or kidney disease. There is no history of chronic renal disease.    Past Medical History:  Diagnosis Date   Diabetes mellitus without complication ([place])    Mixed hyperlipidemia 12/04/2020     No family history on file.   Current Outpatient Medications:    benzonatate (TESSALON PERLES) 100 MG capsule, Take 1 capsule (100 mg total) by mouth every 6 (six) hours as needed., Disp: 30 capsule, Rfl: 2   cholecalciferol (VITAMIN D) 1000 units tablet, Take 1,000 Units by mouth daily., Disp: , Rfl:    dapagliflozin propanediol (FARXIGA) 5 MG TABS tablet, Take 1 tablet (5 mg total) by mouth daily before breakfast., Disp: 90 tablet, Rfl: 1   EPINEPHrine 0.3 mg/0.3 mL IJ SOAJ injection, , Disp: , Rfl:    fluticasone (FLONASE) 50 MCG/ACT nasal spray, USE 1 SPRAY IN EACH NOSTRIL DAILY, Disp: 16 g, Rfl: 0   insulin aspart (NOVOLOG FLEXPEN) 100  UNIT/ML FlexPen, Per SS: < 70 = 0; 70-90 = 4 units; 91-130 = 6 units; 131-150 = 7 units; 151-200 = 8 units; 201-250 = 9 units; 251-300 = 10 units; 301-350 = 11 units; 351-400 = 12 units; 401-450 = 13 units; > 450 = 14 units., Disp: 15 mL, Rfl: 11   loratadine (CLARITIN) 10 MG tablet, TAKE 1 TABLET DAILY, Disp: 90 tablet, Rfl: 3   magnesium  oxide (MAG-OX) 400 MG tablet, Take 400 mg by mouth daily., Disp: , Rfl:    meloxicam (MOBIC) 15 MG tablet, Take 1 tablet (15 mg total) by mouth as needed., Disp: 15 tablet, Rfl: 0   Multiple Vitamin (MULTIVITAMIN) tablet, Take 1 tablet by mouth daily., Disp: , Rfl:    Olopatadine HCl (PAZEO) 0.7 % SOLN, Place 1 drop into both eyes daily., Disp: 1 Bottle, Rfl: 5   Omega-3 Fatty Acids (FISH OIL PO), Take 2 capsules by mouth daily. , Disp: , Rfl:    OZEMPIC, 1 MG/DOSE, 4 MG/3ML SOPN, INJECT 1 MG UNDER THE SKIN ONCE WEEKLY, Disp: 9 mL, Rfl: 3   POTASSIUM BICARBONATE PO, Take 1 tablet by mouth. Patient takes 2 tablets daily, Disp: , Rfl:    pravastatin (PRAVACHOL) 80 MG tablet, Take 1 tablet (80 mg total) by mouth every evening., Disp: 90 tablet, Rfl: 1   telmisartan (MICARDIS) 20 MG tablet, TAKE 1 TABLET DAILY, Disp: 90 tablet, Rfl: 3   TRESIBA FLEXTOUCH 100 UNIT/ML FlexTouch Pen, INJECT 20 UNITS UNDER THE SKIN DAILY (Patient taking differently: Patient is taking 12-20 units under the skin daily), Disp: 15 mL, Rfl: 3   Allergies  Allergen Reactions   Iodine Hives   Shellfish Allergy Hives   Tramadol Rash    Review of Systems  Constitutional: Negative.  Negative for fatigue.  Respiratory: Negative.  Negative for cough, shortness of breath and wheezing.   Cardiovascular: Negative.  Negative for chest pain and palpitations.  Gastrointestinal: Negative.   Endocrine: Negative for polydipsia, polyphagia and polyuria.  Musculoskeletal:  Negative for arthralgias and myalgias.  Neurological: Negative.  Negative for dizziness, weakness and headaches.  Psychiatric/Behavioral:  Negative for confusion.     Today's Vitals   03/18/21 1459  BP: 130/70  Pulse: 78  Temp: 98.1 F (36.7 C)  TempSrc: Oral  Weight: 182 lb 9.6 oz (82.8 kg)  Height: '5\' 4"'  (1.626 m)   Body mass index is 31.34 kg/m.  Wt Readings from Last 3 Encounters:  03/18/21 182 lb 9.6 oz (82.8 kg)  12/10/20 185 lb 3.2 oz (84 kg)   09/25/20 183 lb (83 kg)    Objective:  Physical Exam Constitutional:      Appearance: Normal appearance.  HENT:     Head: Normocephalic and atraumatic.  Cardiovascular:     Rate and Rhythm: Normal rate and regular rhythm.     Pulses: Normal pulses.     Heart sounds: Normal heart sounds. No murmur heard. Pulmonary:     Effort: No respiratory distress.     Breath sounds: Normal breath sounds. No wheezing.  Skin:    General: Skin is warm and dry.     Capillary Refill: Capillary refill takes less than 2 seconds.  Neurological:     Mental Status: She is alert and oriented to person, place, and time.        Assessment And Plan:     1. Type 2 diabetes mellitus without complication, with long-term current use of insulin (HCC) -Chronic, continue meds  --Discussed with patient  the importance of glycemic control and long term complications from uncontrolled diabetes. Discussed with the patient the importance of compliance with home glucose monitoring, diet which includes decrease amount of sugary drinks and foods. Importance of exercise was also discussed with the patient. Importance of eye exams, self foot care and compliance to office visits was also discussed with the patient.  - Hemoglobin A1c  2. Essential hypertension -BP today was 130/70 -Chronic, continue meds  -Limit the intake of processed foods and salt intake. You should increase your intake of green vegetables and fruits. Limit the use of alcohol. Limit fast foods and fried foods. Avoid high fatty saturated and trans fat foods. Keep yourself hydrated with drinking water. Avoid red meats. Eat lean meats instead. Exercise for atleast 30-45 min for atleast 4-5 times a week.  - BMP8+EGFR - CBC no Diff  3. Mixed hyperlipidemia -Chronic,  --Educated patient about a diet that is low in fat and high fatty foods including dairy products. Increase in take of fish and fiber. Decrease intake of red meats and fast foods. Exercise for  atleast 4-5 times a week or atleast 30-45 min. Drink a lot of water.   - Lipid panel  4. Vitamin D deficiency -Will check and supplement if needed. Advised patient to spend atleast 15 min. Daily in sunlight.  - Vitamin D (25 hydroxy)  5. Class 1 obesity due to excess calories with serious comorbidity and body mass index (BMI) of 31.0 to 31.9 in adult Advised patient on a healthy diet including avoiding fast food and red meats. Increase the intake of lean meats including grilled chicken and [place].  Drink a lot of water. Decrease intake of fatty foods. Exercise for 30-45 min. 4-5 a week to decrease the risk of cardiac event.   The patient was encouraged to call or send a message through [place] for any questions or concerns.   Follow up: if symptoms persist or do not get better.   Side effects and appropriate use of all the medication(s) were discussed with the patient today. Patient advised to use the medication(s) as directed by their healthcare provider. The patient was encouraged to read, review, and understand all associated package inserts and contact our office with any questions or concerns. The patient accepts the risks of the treatment plan and had an opportunity to ask questions.   Patient was given opportunity to ask questions. Patient verbalized understanding of the plan and was able to repeat key elements of the plan. All questions were answered to their satisfaction.  Raman Ananiah Maciolek, DNP   I, Raman Jubilee Vivero have reviewed all documentation for this visit. The documentation on 03/18/20 for the exam, diagnosis, procedures, and orders are all accurate and complete.   IF YOU HAVE BEEN REFERRED TO A SPECIALIST, IT MAY TAKE 1-2 WEEKS TO SCHEDULE/PROCESS THE REFERRAL. IF YOU HAVE NOT HEARD FROM US/SPECIALIST IN TWO WEEKS, PLEASE GIVE [place] A CALL AT [phone].   THE PATIENT IS ENCOURAGED TO PRACTICE SOCIAL DISTANCING DUE TO THE COVID-19 PANDEMIC.

## 2021-03-19 LAB — LIPID PANEL
Chol/HDL Ratio: 2.9 ratio (ref 0.0–4.4)
Cholesterol, Total: 162 mg/dL (ref 100–199)
HDL: 55 mg/dL (ref >39)
LDL Chol Calc (NIH): 90 mg/dL (ref 0–99)
Triglycerides: 90 mg/dL (ref 0–149)
VLDL Cholesterol Cal: 17 mg/dL (ref 5–40)

## 2021-03-19 LAB — BMP8+EGFR
BUN/Creatinine Ratio: 16 (ref 12–28)
BUN: 13 mg/dL (ref 8–27)
CO2: 27 mmol/L (ref 20–29)
Calcium: 9.3 mg/dL (ref 8.7–10.3)
Chloride: 103 mmol/L (ref 96–106)
Creatinine, Ser: 0.79 mg/dL (ref 0.57–1.00)
Glucose: 135 mg/dL — ABNORMAL HIGH (ref 70–99)
Potassium: 4.3 mmol/L (ref 3.5–5.2)
Sodium: 141 mmol/L (ref 134–144)
eGFR: 81 mL/min/{1.73_m2} (ref >59)

## 2021-03-19 LAB — CBC
Hematocrit: 40.6 % (ref 34.0–46.6)
Hemoglobin: 13.6 g/dL (ref 11.1–15.9)
MCH: 28.5 pg (ref 26.6–33.0)
MCHC: 33.5 g/dL (ref 31.5–35.7)
MCV: 85 fL (ref 79–97)
Platelets: 277 10*3/uL (ref 150–450)
RBC: 4.77 x10E6/uL (ref 3.77–5.28)
RDW: 13.3 % (ref 11.7–15.4)
WBC: 4 10*3/uL (ref 3.4–10.8)

## 2021-03-19 LAB — HEMOGLOBIN A1C
Est. average glucose Bld gHb Est-mCnc: 177 mg/dL
Hgb A1c MFr Bld: 7.8 % — ABNORMAL HIGH (ref 4.8–5.6)

## 2021-03-19 LAB — VITAMIN D 25 HYDROXY (VIT D DEFICIENCY, FRACTURES): Vit D, 25-Hydroxy: 35.3 ng/mL (ref 30.0–100.0)

## 2021-04-02 ENCOUNTER — Other Ambulatory Visit: Payer: Self-pay | Admitting: Nurse Practitioner

## 2021-04-02 ENCOUNTER — Other Ambulatory Visit: Payer: Self-pay

## 2021-04-02 DIAGNOSIS — E119 Type 2 diabetes mellitus without complications: Secondary | ICD-10-CM

## 2021-04-02 MED ORDER — FLUTICASONE PROPIONATE 50 MCG/ACT NA SUSP: 1.0000 | Freq: Every day | NASAL | 0 refills | Status: DC

## 2021-04-07 ENCOUNTER — Other Ambulatory Visit: Payer: Self-pay

## 2021-04-07 DIAGNOSIS — E119 Type 2 diabetes mellitus without complications: Secondary | ICD-10-CM

## 2021-04-07 MED ORDER — OZEMPIC (1 MG/DOSE) 4 MG/3ML ~~LOC~~ SOPN: PEN_INJECTOR | SUBCUTANEOUS | 1 refills | Status: DC

## 2021-04-08 DIAGNOSIS — Z794 Long term (current) use of insulin: Secondary | ICD-10-CM

## 2021-04-08 DIAGNOSIS — E119 Type 2 diabetes mellitus without complications: Secondary | ICD-10-CM

## 2021-04-08 DIAGNOSIS — I10 Essential (primary) hypertension: Secondary | ICD-10-CM | POA: Diagnosis not present

## 2021-04-08 DIAGNOSIS — E782 Mixed hyperlipidemia: Secondary | ICD-10-CM | POA: Diagnosis not present

## 2021-04-14 ENCOUNTER — Telehealth: Payer: Self-pay

## 2021-04-14 NOTE — Progress Notes (Signed)
° ° °  Called [name], No answer, left message of appointment on 04-18-2021 at 11:15 via telephone visit with Cherylin Mylar, Pharm D. Notified to have all medications, supplements, blood pressure and/or blood sugar logs available during appointment and to return call if need to reschedule.  Care Gaps: Covid booster overdue Yearly ophthalmology overdue AWV 10-22-2021  Star Rating Drug: [name] 5 mg- Last filled 04-02-2021 [facility] Pravastatin 40 mg- Last filled 12-12-2020 [facility] Telmisartan 20 mg- Last filled 09-23-2020 [facility] ([facility] and was informed that medication was last filled 09-23-2020) Ozempic 1 mg- Last filled 04-07-2021 [facility]  Any gaps in medications fill history? Yes  Huey Romans [facility] Clinical Pharmacist Assistant [phone]

## 2021-04-18 ENCOUNTER — Ambulatory Visit (INDEPENDENT_AMBULATORY_CARE_PROVIDER_SITE_OTHER): Payer: Medicare Other

## 2021-04-18 DIAGNOSIS — E119 Type 2 diabetes mellitus without complications: Secondary | ICD-10-CM

## 2021-04-18 DIAGNOSIS — E782 Mixed hyperlipidemia: Secondary | ICD-10-CM

## 2021-04-18 DIAGNOSIS — Z794 Long term (current) use of insulin: Secondary | ICD-10-CM

## 2021-04-18 NOTE — Progress Notes (Signed)
Chronic Care Management Pharmacy Note  04/30/2021 Name:  Jacqueline Bell MRN:  [id number] DOB:  March 16, 1950   Summary: Patient reports that she has been taking her medication everyday    Recommendations/Changes made from today's visit: Recommend patient decrease the amount of rice she is eating. Recommend patient be started on Atorvastatin 10 mg tablet daily, and changed from Ozempic to Rybelsus due to medication shortages.    Plan: Jacqueline Bell reports that she is going to increase the amount vegetables she is eating.  Patient Ozempic changed to Rybelsus and sent to mail order pharmacy. Atorvastatin sent to to mail order pharmacy, all medications that were changed discontinued in the chart.    Subjective: Jacqueline Bell is an 71 y.o. year old female who is a primary patient of [name], [place].  The CCM team was consulted for assistance with disease management and care coordination needs.    Engaged with patient by telephone for follow up visit in response to provider referral for pharmacy case management and/or care coordination services.   Consent to Services:  The patient was given information about Chronic Care Management services, agreed to services, and gave verbal consent prior to initiation of services.  Please see initial visit note for detailed documentation.   Patient Care Team: [name], FNP as PCP - General (General Practice) Warden Fillers, MD as Consulting Physician (Ophthalmology) Lynne Logan, RN as Case Manager  Recent office visits: 12/10/2020 PCP Office Visit   Recent consult visits: 01/14/2021 [facility] visits: None in previous 6 months   Objective:  Lab Results  Component Value Date   CREATININE 0.79 03/18/2021   BUN 13 03/18/2021   EGFR 81 03/18/2021   GFRNONAA 84 02/22/2020   GFRAA 96 02/22/2020   NA 141 03/18/2021   K 4.3 03/18/2021   CALCIUM 9.3 03/18/2021   CO2 27 03/18/2021   GLUCOSE 135 (H) 03/18/2021     Lab Results  Component Value Date/Time   HGBA1C 7.8 (H) 03/18/2021 03:54 PM   HGBA1C 7.8 (H) 12/10/2020 12:46 PM   MICROALBUR 10 05/17/2019 12:47 PM   MICROALBUR 30 05/05/2018 01:12 PM    Last diabetic Eye exam:  Lab Results  Component Value Date/Time   HMDIABEYEEXA Retinopathy (A) 06/09/2017 12:00 AM    Last diabetic Foot exam: No results found for: HMDIABFOOTEX   Lab Results  Component Value Date   CHOL 162 03/18/2021   HDL 55 03/18/2021   LDLCALC 90 03/18/2021   TRIG 90 03/18/2021   CHOLHDL 2.9 03/18/2021    Hepatic Function Latest Ref Rng & Units 12/10/2020 08/27/2020 05/23/2020  Total Protein 6.0 - 8.5 g/dL 7.8 7.6 7.7  Albumin 3.8 - 4.8 g/dL 4.6 4.6 4.7  AST 0 - 40 IU/L _0 ALT 0 - 32 IU/L _1 Alk Phosphatase 44 - 121 IU/L 54 59 48  Total Bilirubin 0.0 - 1.2 mg/dL 0.5 0.3 0.6    Lab Results  Component Value Date/Time   TSH 0.482 05/05/2018 02:14 PM    CBC Latest Ref Rng & Units 03/18/2021 08/27/2020 04/21/2008  WBC 3.4 - 10.8 x10E3/uL 4.0 4.0 4.2  Hemoglobin 11.1 - 15.9 g/dL 13.6 12.9 12.4  Hematocrit 34.0 - 46.6 % 40.6 38.2 36.6  Platelets 150 - 450 x10E3/uL 277 275 236    Lab Results  Component Value Date/Time   VD25OH 35.3 03/18/2021 03:54 PM   VD25OH 32.2 12/10/2020 12:46 PM    Clinical  ASCVD: No  The 10-year ASCVD risk score (Arnett DK, et al., 2019) is: 21.7%   Values used to calculate the score:     Age: 71 years     Sex: Female     Is Non-Hispanic African American: Yes     Diabetic: Yes     Tobacco smoker: No     Systolic Blood Pressure: 893 mmHg     Is BP treated: Yes     HDL Cholesterol: 55 mg/dL     Total Cholesterol: 162 mg/dL    Depression screen [facility] 2/9 09/25/2020 05/23/2020 05/17/2019  Decreased Interest 0 0 0  Down, Depressed, Hopeless 0 0 0  PHQ - 2 Score 0 0 0  Altered sleeping - - 3  Tired, decreased energy - - 0  Change in appetite - - 0  Feeling bad or failure about yourself  - - 0  Trouble concentrating - - 0   Moving slowly or fidgety/restless - - 0  Suicidal thoughts - - 0  PHQ-9 Score - - 3  Difficult doing work/chores - - Not difficult at all      Social History   Tobacco Use  Smoking Status Never  Smokeless Tobacco Never   BP Readings from Last 3 Encounters:  03/18/21 130/70  12/10/20 122/68  08/27/20 (!) 142/78   Pulse Readings from Last 3 Encounters:  03/18/21 78  12/10/20 80  08/27/20 78   Wt Readings from Last 3 Encounters:  03/18/21 182 lb 9.6 oz (82.8 kg)  12/10/20 185 lb 3.2 oz (84 kg)  09/25/20 183 lb (83 kg)   BMI Readings from Last 3 Encounters:  03/18/21 31.34 kg/m  12/10/20 28.16 kg/m  09/25/20 27.83 kg/m    Assessment/Interventions: Review of patient past medical history, allergies, medications, health status, including review of consultants reports, laboratory and other test data, was performed as part of comprehensive evaluation and provision of chronic care management services.   SDOH:  (Social Determinants of Health) assessments and interventions performed: Yes  SDOH Screenings   Alcohol Screen: Not on file  Depression (PHQ2-9): Low Risk    PHQ-2 Score: 0  Financial Resource Strain: Low Risk    Difficulty of Paying Living Expenses: Not hard at all  Food Insecurity: No Food Insecurity   Worried About Charity fundraiser in the Last Year: Never true   Ran Out of Food in the Last Year: Never true  Housing: Not on file  Physical Activity: Insufficiently Active   Days of Exercise per Week: 3 days   Minutes of Exercise per Session: 20 min  Social Connections: Not on file  Stress: No Stress Concern Present   Feeling of Stress : Not at all  Tobacco Use: Low Risk    Smoking Tobacco Use: Never   Smokeless Tobacco Use: Never   Passive Exposure: Not on file  Transportation Needs: No Transportation Needs   Lack of Transportation (Medical): No   Lack of Transportation (Non-Medical): No    CCM Care Plan  Allergies  Allergen Reactions   Iodine  Hives   Shellfish Allergy Hives   Tramadol Rash    Medications Reviewed Today     Reviewed by Mayford Knife, RPH (Pharmacist) on 04/30/21 at 1603  Med List Status: <None>   Medication Order Taking? Sig Documenting Provider Last Dose Status Informant  benzonatate (TESSALON PERLES) 100 MG capsule [id number]  Take 1 capsule (100 mg total) by mouth every 6 (six) hours as needed. [name], FNP  Active   cholecalciferol (VITAMIN D) 1000 units tablet [id number] Yes Take 1,000 Units by mouth daily. [provider] Taking Active   EPINEPHrine 0.3 mg/0.3 mL IJ SOAJ injection [id number] Yes  [provider] Taking Active   FARXIGA 5 MG TABS tablet [id number] Yes TAKE 1 TABLET DAILY BEFORE Evelena Peat, FNP Taking Active   fluticasone (FLONASE) 50 MCG/ACT nasal spray [id number] Yes Place 1 spray into both nostrils daily. [name], FNP Taking Active   insulin aspart (NOVOLOG FLEXPEN) 100 UNIT/ML FlexPen [id number] Yes Per SS: < 70 = 0; 70-90 = 4 units; 91-130 = 6 units; 131-150 = 7 units; 151-200 = 8 units; 201-250 = 9 units; 251-300 = 10 units; 301-350 = 11 units; 351-400 = 12 units; 401-450 = 13 units; > 450 = 14 units. [name], FNP Taking Active   loratadine (CLARITIN) 10 MG tablet [id number] Yes TAKE 1 TABLET DAILY [name], FNP Taking Active   magnesium oxide (MAG-OX) 400 MG tablet [id number] Yes Take 400 mg by mouth daily. [provider] Taking Active Self  meloxicam (MOBIC) 15 MG tablet [id number] Yes Take 1 tablet (15 mg total) by mouth as needed. [name], FNP Taking Active   Multiple Vitamin (MULTIVITAMIN) tablet [id number] Yes Take 1 tablet by mouth daily. [provider] Taking Active   Olopatadine HCl (PAZEO) 0.7 % SOLN [id number] Yes Place 1 drop into both eyes daily. Bobbitt, [place] Muta, MD Taking Active            Med Note (LITTLE, Simeon Craft May 19, 2018 11:45 AM) Patient is taking as needed.   Omega-3 Fatty Acids (FISH  OIL PO) [id number] Yes Take 2 capsules by mouth daily.  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   Allergic reaction 04/04/2018   Cramp and spasm 02/24/2018   History of food allergy 02/24/2018   Type 2 diabetes mellitus without complication, with long-term current use of insulin ([place]) 02/24/2018   Insomnia 02/24/2018    Immunization History  Administered Date(s) Administered   Fluad Quad(high Dose 65+) 11/23/2019   IPV 08/28/1996   Influenza, High Dose Seasonal PF 11/28/2018   Influenza-Unspecified 11/18/2017   PFIZER(Purple Top)SARS-COV-2 Vaccination 04/07/2019, 05/01/2019, 01/17/2020   PNEUMOCOCCAL CONJUGATE-20 08/28/2020    Conditions to be addressed/monitored:  Hyperlipidemia and Diabetes  Care Plan : [place]  Updates made by Mayford Knife, RPH since 04/30/2021 12:00 AM     Problem: DM, HYPERLIPIDEMIA      Goal: Disease Management   Recent Progress: On track  Priority: High  Note:   Current Barriers:  Unable to maintain control of cholesterol and diabetes   Pharmacist Clinical Goal(s):  Patient will achieve adherence to monitoring guidelines and medication adherence to achieve therapeutic efficacy through collaboration with PharmD and provider.   Interventions: 1:1 collaboration with [name], FNP regarding development and update of  comprehensive plan of care as evidenced by  provider attestation and co-signature Inter-disciplinary care team collaboration (see longitudinal plan of care) Comprehensive medication review performed; medication list updated in electronic medical record  Hyperlipidemia: (LDL goal < 70) -Uncontrolled -Current treatment: Pravastatin 80 mg tablet once per day  Appropriate, Query effective -Medications previously tried: Pravastatin 40 mg tablet   -Current dietary patterns: patient reports that she is not eating a lot of fried and fatty foods  -Current exercise habits: please see diabetes  -She is taking it at night everyday  -Patient reports that she is comfortable with changing the medication once she has finished her currently bottle of Pravastatin  -Educated on Cholesterol goals;  Benefits of statin for ASCVD risk reduction; Exercise goal of 150 minutes per week; -Collaborated with PCP team to change patient to Atorvastatin 10 mg tablet once per day  -Ms. Drakeford will let the PCP know when she has the medication filled so we can place the order for 6 weeks from her start date  Diabetes (A1c goal <7%) -Uncontrolled -Current medications: Ozempic 1 mg once weekly Appropriate, Effective, Safe, Query accessible Novolog Flexpen - use as needed per sliding scale Appropriate, Query effective  Tresiba 100 Unit/ml- inject 20 units under the skin dialy Appropriate, Query effective  Farxiga 5 mg tableting 1 tablet by mouth daily Appropriate, Query effective -Current home glucose readings fasting glucose: 150-160 post prandial glucose: 180 -Denies hypoglycemic/hyperglycemic symptoms -Current meal patterns: she is eating rice everyday for breakfast, lunch or dinner or a snack  every single day, goal for patient to start eating cauliflower rice frequently dinner: bowl of rice, butter and salt pepper, eating it everyday  -Current exercise: She is walking and through the [place] [facility] -Congratulate  Ms. Ekdahl on enrolling in the [facility]ics [facility]  -Educated on A1c and blood sugar goals; Complications of diabetes including kidney damage, retinal damage, and cardiovascular disease; Exercise goal of 150 minutes per week; We discussed increasing vegetables and decreasing the amount of rice -Counseled to check feet daily and get yearly eye exams -Collaborated with PCP team to increase patients Farxiga to 10 mg tablet once per day, recommend patients medication be changed to Rybelsus due to mail order availability.  Patient to be switched to Rybelsus  14 mg to mail order.   Patient Goals/Self-Care Activities Patient will:  - take medications as prescribed as evidenced by patient report and record review collaborate with provider on medication access solutions  Follow Up Plan: The patient has been provided with contact information for the care management team and has been advised to call with any health related questions or concerns.       Medication Assistance: None required.  Patient affirms current coverage meets needs.  Compliance/Adherence/Medication fill history: Care Gaps: Opthamology Exam  COVID-19 Vaccine Booster  Star-Rating Drugs: Pravastatin 80 mg tablet daily Ozempic 1 mg/Dose Farxiga 5 mg  Telmisartan 20 mg tablet   Patient's preferred pharmacy is:  [facility] - [address], [place] [address] Phone: [phone] Fax: [phone]  [facility] [phone] - [place], [place] - [address] [place] [address] Phone: [phone] Fax: [phone]  [facility] [place], [place]  [address] Phone: [phone] Fax: [phone]  Uses pill box? Yes Pt endorses 95% compliance  We discussed: Benefits of medication synchronization, packaging  and delivery as well as enhanced pharmacist oversight with [employer]. Patient decided to: Continue current medication management strategy  Care Plan and Follow Up Patient Decision:  Patient agrees to Care Plan and Follow-up.  Plan: The patient has been provided with contact information for the care management team and has been advised to call with any health related questions or concerns.   [name], CPP, PharmD Clinical Pharmacist Practitioner [facility] [phone]

## 2021-04-30 MED ORDER — RYBELSUS 14 MG PO TABS
14.0000 mg | ORAL_TABLET | Freq: Every day | ORAL | 0 refills | Status: DC
Start: 2021-04-30 — End: 2021-09-10

## 2021-04-30 MED ORDER — ATORVASTATIN CALCIUM 10 MG PO TABS: 10.0000 mg | ORAL_TABLET | Freq: Every day | ORAL | 0 refills | Status: DC

## 2021-04-30 NOTE — Progress Notes (Signed)
Chronic Care Management Pharmacy Note  04/30/2021 Name:  Jacqueline Bell MRN:  [id number] DOB:  1950/06/03  Summary: Patient reports that she has been taking her medication everyday   Recommendations/Changes made from today's visit: Recommend patient decrease the amount of rice she is eating. Recommend patient be started on Atorvastatin 10 mg tablet daily, and changed from Ozempic to Rybelsus due to medication shortages.   Plan: Ms. Huntsberry reports that she is going to increase the amount vegetables she is eating.  Patient Ozempic changed to Rybelsus and sent to mail order pharmacy. Atorvastatin sent to to mail order pharmacy, all medications that were changed discontinued in the chart.    Subjective: Jacqueline Bell is an 71 y.o. year old female who is a primary patient of [name], [place].  The CCM team was consulted for assistance with disease management and care coordination needs.    Engaged with patient by telephone for follow up visit in response to provider referral for pharmacy case management and/or care coordination services.   Consent to Services:  The patient was given information about Chronic Care Management services, agreed to services, and gave verbal consent prior to initiation of services.  Please see initial visit note for detailed documentation.   Patient Care Team: [name], FNP as PCP - General (General Practice) Warden Fillers, MD as Consulting Physician (Ophthalmology) Lynne Logan, RN as Case Manager  Recent office visits: 03/18/2021 PCP OV 12/10/2020 PCP OV  Recent consult visits: 01/14/2021 [facility] visits: None in previous 6 months   Objective:  Lab Results  Component Value Date   CREATININE 0.79 03/18/2021   BUN 13 03/18/2021   EGFR 81 03/18/2021   GFRNONAA 84 02/22/2020   GFRAA 96 02/22/2020   NA 141 03/18/2021   K 4.3 03/18/2021   CALCIUM 9.3 03/18/2021   CO2 27 03/18/2021   GLUCOSE 135 (H) 03/18/2021    Lab  Results  Component Value Date/Time   HGBA1C 7.8 (H) 03/18/2021 03:54 PM   HGBA1C 7.8 (H) 12/10/2020 12:46 PM   MICROALBUR 10 05/17/2019 12:47 PM   MICROALBUR 30 05/05/2018 01:12 PM    Last diabetic Eye exam:  Lab Results  Component Value Date/Time   HMDIABEYEEXA Retinopathy (A) 06/09/2017 12:00 AM    Last diabetic Foot exam: No results found for: HMDIABFOOTEX   Lab Results  Component Value Date   CHOL 162 03/18/2021   HDL 55 03/18/2021   LDLCALC 90 03/18/2021   TRIG 90 03/18/2021   CHOLHDL 2.9 03/18/2021    Hepatic Function Latest Ref Rng & Units 12/10/2020 08/27/2020 05/23/2020  Total Protein 6.0 - 8.5 g/dL 7.8 7.6 7.7  Albumin 3.8 - 4.8 g/dL 4.6 4.6 4.7  AST 0 - 40 IU/L '28 18 21  ' ALT 0 - 32 IU/L '15 14 13  ' Alk Phosphatase 44 - 121 IU/L 54 59 48  Total Bilirubin 0.0 - 1.2 mg/dL 0.5 0.3 0.6    Lab Results  Component Value Date/Time   TSH 0.482 05/05/2018 02:14 PM    CBC Latest Ref Rng & Units 03/18/2021 08/27/2020 04/21/2008  WBC 3.4 - 10.8 x10E3/uL 4.0 4.0 4.2  Hemoglobin 11.1 - 15.9 g/dL 13.6 12.9 12.4  Hematocrit 34.0 - 46.6 % 40.6 38.2 36.6  Platelets 150 - 450 x10E3/uL 277 275 236    Lab Results  Component Value Date/Time   VD25OH 35.3 03/18/2021 03:54 PM   VD25OH 32.2 12/10/2020 12:46 PM    Clinical ASCVD: No  The  10-year ASCVD risk score (Arnett DK, et al., 2019) is: 21.7%   Values used to calculate the score:     Age: 71 years     Sex: Female     Is Non-Hispanic African American: Yes     Diabetic: Yes     Tobacco smoker: No     Systolic Blood Pressure: 638 mmHg     Is BP treated: Yes     HDL Cholesterol: 55 mg/dL     Total Cholesterol: 162 mg/dL    Depression screen [facility] 2/9 09/25/2020 05/23/2020 05/17/2019  Decreased Interest 0 0 0  Down, Depressed, Hopeless 0 0 0  PHQ - 2 Score 0 0 0  Altered sleeping - - 3  Tired, decreased energy - - 0  Change in appetite - - 0  Feeling bad or failure about yourself  - - 0  Trouble concentrating - - 0  Moving  slowly or fidgety/restless - - 0  Suicidal thoughts - - 0  PHQ-9 Score - - 3  Difficult doing work/chores - - Not difficult at all      Social History   Tobacco Use  Smoking Status Never  Smokeless Tobacco Never   BP Readings from Last 3 Encounters:  03/18/21 130/70  12/10/20 122/68  08/27/20 (!) 142/78   Pulse Readings from Last 3 Encounters:  03/18/21 78  12/10/20 80  08/27/20 78   Wt Readings from Last 3 Encounters:  03/18/21 182 lb 9.6 oz (82.8 kg)  12/10/20 185 lb 3.2 oz (84 kg)  09/25/20 183 lb (83 kg)   BMI Readings from Last 3 Encounters:  03/18/21 31.34 kg/m  12/10/20 28.16 kg/m  09/25/20 27.83 kg/m    Assessment/Interventions: Review of patient past medical history, allergies, medications, health status, including review of consultants reports, laboratory and other test data, was performed as part of comprehensive evaluation and provision of chronic care management services.   SDOH:  (Social Determinants of Health) assessments and interventions performed: Yes  SDOH Screenings   Alcohol Screen: Not on file  Depression (PHQ2-9): Low Risk    PHQ-2 Score: 0  Financial Resource Strain: Low Risk    Difficulty of Paying Living Expenses: Not hard at all  Food Insecurity: No Food Insecurity   Worried About Charity fundraiser in the Last Year: Never true   Ran Out of Food in the Last Year: Never true  Housing: Not on file  Physical Activity: Insufficiently Active   Days of Exercise per Week: 3 days   Minutes of Exercise per Session: 20 min  Social Connections: Not on file  Stress: No Stress Concern Present   Feeling of Stress : Not at all  Tobacco Use: Low Risk    Smoking Tobacco Use: Never   Smokeless Tobacco Use: Never   Passive Exposure: Not on file  Transportation Needs: No Transportation Needs   Lack of Transportation (Medical): No   Lack of Transportation (Non-Medical): No    CCM Care Plan  Allergies  Allergen Reactions   Iodine Hives    Shellfish Allergy Hives   Tramadol Rash    Medications Reviewed Today     Reviewed by Mayford Knife, RPH (Pharmacist) on 04/30/21 at 1603  Med List Status: <None>   Medication Order Taking? Sig Documenting Provider Last Dose Status Informant  benzonatate (TESSALON PERLES) 100 MG capsule [id number]  Take 1 capsule (100 mg total) by mouth every 6 (six) hours as needed. [name], FNP  Active   cholecalciferol (  VITAMIN D) 1000 units tablet [id number] Yes Take 1,000 Units by mouth daily. [provider] Taking Active   EPINEPHrine 0.3 mg/0.3 mL IJ SOAJ injection [id number] Yes  [provider] Taking Active   FARXIGA 5 MG TABS tablet [id number] Yes TAKE 1 TABLET DAILY BEFORE Evelena Peat, FNP Taking Active   fluticasone (FLONASE) 50 MCG/ACT nasal spray [id number] Yes Place 1 spray into both nostrils daily. [name], FNP Taking Active   insulin aspart (NOVOLOG FLEXPEN) 100 UNIT/ML FlexPen [id number] Yes Per SS: < 70 = 0; 70-90 = 4 units; 91-130 = 6 units; 131-150 = 7 units; 151-200 = 8 units; 201-250 = 9 units; 251-300 = 10 units; 301-350 = 11 units; 351-400 = 12 units; 401-450 = 13 units; > 450 = 14 units. [name], FNP Taking Active   loratadine (CLARITIN) 10 MG tablet [id number] Yes TAKE 1 TABLET DAILY [name], FNP Taking Active   magnesium oxide (MAG-OX) 400 MG tablet [id number] Yes Take 400 mg by mouth daily. [provider] Taking Active Self  meloxicam (MOBIC) 15 MG tablet [id number] Yes Take 1 tablet (15 mg total) by mouth as needed. [name], FNP Taking Active   Multiple Vitamin (MULTIVITAMIN) tablet [id number] Yes Take 1 tablet by mouth daily. [provider] Taking Active   Olopatadine HCl (PAZEO) 0.7 % SOLN [id number] Yes Place 1 drop into both eyes daily. Bobbitt, [place] Muta, MD Taking Active            Med Note (LITTLE, Simeon Craft May 19, 2018 11:45 AM) Patient is taking as needed.   Omega-3 Fatty Acids (FISH OIL PO)  [id number] Yes Take 2 capsules by mouth daily.  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   Allergic reaction 04/04/2018   Cramp and spasm 02/24/2018   History of food allergy 02/24/2018   Type 2 diabetes mellitus without complication, with long-term current use of insulin ([place]) 02/24/2018   Insomnia 02/24/2018    Immunization History  Administered Date(s) Administered   Fluad Quad(high Dose 65+) 11/23/2019   IPV 08/28/1996   Influenza, High Dose Seasonal PF 11/28/2018   Influenza-Unspecified 11/18/2017   PFIZER(Purple Top)SARS-COV-2 Vaccination 04/07/2019, 05/01/2019, 01/17/2020   PNEUMOCOCCAL CONJUGATE-20 08/28/2020    Conditions to be addressed/monitored:  Hyperlipidemia and Diabetes  Care Plan : [place]  Updates made by Mayford Knife, RPH since 04/30/2021 12:00 AM     Problem: DM, HYPERLIPIDEMIA      Goal: Disease Management   Recent Progress: On track  Priority: High  Note:   Current Barriers:  Unable to maintain control of cholesterol and diabetes   Pharmacist Clinical Goal(s):  Patient will achieve adherence to monitoring guidelines and medication adherence to achieve therapeutic efficacy through collaboration with PharmD and provider.   Interventions: 1:1 collaboration with [name], FNP regarding development and update of comprehensive plan of care  as evidenced by provider  attestation and co-signature Inter-disciplinary care team collaboration (see longitudinal plan of care) Comprehensive medication review performed; medication list updated in electronic medical record  Hyperlipidemia: (LDL goal < 70) -Uncontrolled -Current treatment: Pravastatin 80 mg tablet once per day  Appropriate, Query effective -Medications previously tried: Pravastatin 40 mg tablet   -Current dietary patterns: patient reports that she is not eating a lot of fried and fatty foods  -Current exercise habits: please see diabetes  -She is taking it at night everyday  -Patient reports that she is comfortable with changing the medication once she has finished her currently bottle of Pravastatin  -Educated on Cholesterol goals;  Benefits of statin for ASCVD risk reduction; Exercise goal of 150 minutes per week; -Collaborated with PCP team to change patient to Atorvastatin 10 mg tablet once per day  -Ms. Valentine will let the PCP know when she has the medication filled so we can place the order for 6 weeks from her start date  Diabetes (A1c goal <7%) -Uncontrolled -Current medications: Ozempic 1 mg once weekly Appropriate, Effective, Safe, Query accessible Novolog Flexpen - use as needed per sliding scale Appropriate, Query effective  Tresiba 100 Unit/ml- inject 20 units under the skin dialy Appropriate, Query effective  Farxiga 5 mg tableting 1 tablet by mouth daily Appropriate, Query effective -Current home glucose readings fasting glucose: 150-160 post prandial glucose: 180 -Denies hypoglycemic/hyperglycemic symptoms -Current meal patterns: she is eating rice everyday for breakfast, lunch or dinner or a snack  every single day, goal for patient to start eating cauliflower rice frequently dinner: bowl of rice, butter and salt pepper, eating it everyday  -Current exercise: She is walking and through the [place] [facility] -Congratulate Ms. Barbary  on en[facility]  -Educated on A1c and blood sugar goals; Complications of diabetes including kidney damage, retinal damage, and cardiovascular disease; Exercise goal of 150 minutes per week; We discussed increasing vegetables and decreasing the amount of rice -Counseled to check feet daily and get yearly eye exams -Collaborated with PCP team to increase patients Farxiga to 10 mg tablet once per day, recommend patients medication be changed to Rybelsus due to mail order availability.  Patient to be switched to Rybelsus  14 mg to mail order.   Patient Goals/Self-Care Activities Patient will:  - take medications as prescribed as evidenced by patient report and record review collaborate with provider on medication access solutions  Follow Up Plan: The patient has been provided with contact information for the care management team and has been advised to call with any health related questions or concerns.       Medication Assistance: None required.  Patient affirms current coverage meets needs.  Compliance/Adherence/Medication fill history: Care Gaps: Opthamology Exam  COVID-19 Vaccine Booster  Star-Rating Drugs: Pravastatin 80 mg tablet daily Ozempic 1 mg/Dose Farxiga 5 mg  Telmisartan 20 mg tablet   Patient's preferred pharmacy is:  [facility] - [place], [place] [address] [place] [address] Phone: [phone] Fax: [phone]  [facility] [phone] - [place], [place] - [address] [place] [address] Phone: [phone] Fax: [phone]  [facility] [place], [place] [address] [place] [address] Phone: [phone] Fax:  [phone]  Uses pill box? Yes Pt endorses 95% compliance  We discussed: Benefits of medication synchronization, packaging and  delivery as well as enhanced pharmacist oversight with [employer]. Patient decided to: Continue current medication management strategy  Care Plan and Follow Up Patient Decision:  Patient agrees to Care Plan and Follow-up.  Plan: The patient has been provided with contact information for the care management team and has been advised to call with any health related questions or concerns.   [name], CPP, PharmD Clinical Pharmacist Practitioner [facility] [phone]

## 2021-04-30 NOTE — Patient Instructions (Signed)
Visit Information It was great speaking with you today!  Please let me know if you have any questions about our visit.   Goals Addressed             This Visit's Progress    Set My Target A1C-Diabetes Type 2       Timeframe:  Long-Range Goal Priority:  High Start Date:                             Expected End Date:                       Follow Up Date 07/18/2021    - set target A1C    Why is this important?   Your target A1C is decided together by you and your doctor.  It is based on several things like your age and other health issues.    Notes:  It was great speaking with you. Please call if you have any questions.        Patient Care Plan: CCM Pharmacy Care Plan     Problem Identified: DM, HYPERLIPIDEMIA      Goal: Disease Management   Recent Progress: On track  Priority: High  Note:   Current Barriers:  Unable to maintain control of cholesterol and diabetes   Pharmacist Clinical Goal(s):  Patient will achieve adherence to monitoring guidelines and medication adherence to achieve therapeutic efficacy through collaboration with PharmD and provider.   Interventions: 1:1 collaboration with Arnette Felts, FNP regarding development and update of comprehensive plan of care as evidenced by provider attestation and co-signature Inter-disciplinary care team collaboration (see longitudinal plan of care) Comprehensive medication review performed; medication list updated in electronic medical record  Hyperlipidemia: (LDL goal < 70) -Uncontrolled -Current treatment: Pravastatin 80 mg tablet once per day  Appropriate, Query effective -Medications previously tried: Pravastatin 40 mg tablet   -Current dietary patterns: patient reports that she is not eating a lot of fried and fatty foods  -Current exercise habits: please see diabetes  -She is taking it at night everyday  -Patient reports that she is comfortable with changing the medication once she has finished her  currently bottle of Pravastatin  -Educated on Cholesterol goals;  Benefits of statin for ASCVD risk reduction; Exercise goal of 150 minutes per week; -Collaborated with PCP team to change patient to Atorvastatin 10 mg tablet once per day  -Ms. Mchaney will let the PCP know when she has the medication filled so we can place the order for 6 weeks from her start date  Diabetes (A1c goal <7%) -Uncontrolled -Current medications: Ozempic 1 mg once weekly Appropriate, Effective, Safe, Query accessible Novolog Flexpen - use as needed per sliding scale Appropriate, Query effective  Tresiba 100 Unit/ml- inject 20 units under the skin dialy Appropriate, Query effective  Farxiga 5 mg tableting 1 tablet by mouth daily Appropriate, Query effective -Current home glucose readings fasting glucose: 150-160 post prandial glucose: 180 -Denies hypoglycemic/hyperglycemic symptoms -Current meal patterns: she is eating rice everyday for breakfast, lunch or dinner or a snack  every single day, goal for patient to start eating cauliflower rice frequently dinner: bowl of rice, butter and salt pepper, eating it everyday  -Current exercise: She is walking and through the city [facility] -Congratulate Ms. Borenstein on en[facility]ics [facility]  -Educated on A1c  and blood sugar goals; Complications of diabetes including kidney damage, retinal damage, and cardiovascular disease; Exercise goal of 150 minutes per week; We discussed increasing vegetables and decreasing the amount of rice -Counseled to check feet daily and get yearly eye exams -Collaborated with PCP team to increase patients Farxiga to 10 mg tablet once per day, recommend patients medication be changed to Rybelsus due to mail order availability.  Patient to be switched to Rybelsus  14 mg to mail order.   Patient Goals/Self-Care Activities Patient will:  - take medications as prescribed as  evidenced by patient report and record review collaborate with provider on medication access solutions  Follow Up Plan: The patient has been provided with contact information for the care management team and has been advised to call with any health related questions or concerns.      Patient agreed to services and verbal consent obtained.   The patient verbalized understanding of instructions, educational materials, and care plan provided today and agreed to receive a mailed copy of patient instructions, educational materials, and care plan.   Cherylin Mylar, PharmD Clinical Pharmacist [facility] [phone]

## 2021-05-06 DIAGNOSIS — Z794 Long term (current) use of insulin: Secondary | ICD-10-CM | POA: Diagnosis not present

## 2021-05-06 DIAGNOSIS — E119 Type 2 diabetes mellitus without complications: Secondary | ICD-10-CM

## 2021-05-06 DIAGNOSIS — E782 Mixed hyperlipidemia: Secondary | ICD-10-CM

## 2021-05-15 ENCOUNTER — Telehealth: Payer: Medicare Other

## 2021-05-15 ENCOUNTER — Ambulatory Visit (INDEPENDENT_AMBULATORY_CARE_PROVIDER_SITE_OTHER): Payer: Medicare Other

## 2021-05-15 DIAGNOSIS — I10 Essential (primary) hypertension: Secondary | ICD-10-CM

## 2021-05-15 DIAGNOSIS — G4700 Insomnia, unspecified: Secondary | ICD-10-CM

## 2021-05-15 DIAGNOSIS — E782 Mixed hyperlipidemia: Secondary | ICD-10-CM

## 2021-05-15 DIAGNOSIS — E559 Vitamin D deficiency, unspecified: Secondary | ICD-10-CM

## 2021-05-15 DIAGNOSIS — E119 Type 2 diabetes mellitus without complications: Secondary | ICD-10-CM

## 2021-05-15 NOTE — Patient Instructions (Signed)
Visit Information ? ?Thank you for taking time to visit with me today. Please don't hesitate to contact me if I can be of assistance to you before our next scheduled telephone appointment. ? ?Following are the goals we discussed today:  ?(Copy and paste patient goals from clinical care plan here) ? ?Our next appointment is by telephone on 06/18/21 at 12 PM  ? ?Please call the care guide team at [phone] if you need to cancel or reschedule your appointment.  ? ?If you are experiencing a Mental Health or [place] or need someone to talk to, please call 1-800-273-TALK (toll free, 24 hour hotline)  ? ?Patient verbalizes understanding of instructions and care plan provided today and agrees to view in [place]. Active MyChart status confirmed with patient.   ? ?Barb Merino, RN, BSN, CCM ?Care Management Coordinator ?[facility]  ?Direct Phone: [phone] ? ? ?

## 2021-05-15 NOTE — Chronic Care Management (AMB) (Cosign Needed)
Chronic Care Management   CCM RN Visit Note  05/15/2021 Name: Jacqueline Bell MRN: [id number] DOB: Jan 20, 1951  Subjective: Jacqueline Bell is a 71 y.o. year old female who is a primary care patient of [name], [place]. The care management team was consulted for assistance with disease management and care coordination needs.    Engaged with patient by telephone for follow up visit in response to provider referral for case management and/or care coordination services.   Consent to Services:  The patient was given information about Chronic Care Management services, agreed to services, and gave verbal consent prior to initiation of services.  Please see initial visit note for detailed documentation.   Patient agreed to services and verbal consent obtained.   Assessment: Review of patient past medical history, allergies, medications, health status, including review of consultants reports, laboratory and other test data, was performed as part of comprehensive evaluation and provision of chronic care management services.   SDOH (Social Determinants of Health) assessments and interventions performed:  Yes, no acute needs   CCM Care Plan  Allergies  Allergen Reactions   Iodine Hives   Shellfish Allergy Hives   Tramadol Rash    Outpatient Encounter Medications as of 05/15/2021  Medication Sig Note   atorvastatin (LIPITOR) 10 MG tablet Take 1 tablet (10 mg total) by mouth daily.    benzonatate (TESSALON PERLES) 100 MG capsule Take 1 capsule (100 mg total) by mouth every 6 (six) hours as needed.    cholecalciferol (VITAMIN D) 1000 units tablet Take 1,000 Units by mouth daily.    EPINEPHrine 0.3 mg/0.3 mL IJ SOAJ injection     FARXIGA 5 MG TABS tablet TAKE 1 TABLET DAILY BEFORE BREAKFAST    fluticasone (FLONASE) 50 MCG/ACT nasal spray Place 1 spray into both nostrils daily.    insulin aspart (NOVOLOG FLEXPEN) 100 UNIT/ML FlexPen Per SS: < 70 = 0; 70-90 = 4 units; 91-130 = 6 units; 131-150 =  7 units; 151-200 = 8 units; 201-250 = 9 units; 251-300 = 10 units; 301-350 = 11 units; 351-400 = 12 units; 401-450 = 13 units; > 450 = 14 units.    loratadine (CLARITIN) 10 MG tablet TAKE 1 TABLET DAILY    magnesium oxide (MAG-OX) 400 MG tablet Take 400 mg by mouth daily.    meloxicam (MOBIC) 15 MG tablet Take 1 tablet (15 mg total) by mouth as needed.    Multiple Vitamin (MULTIVITAMIN) tablet Take 1 tablet by mouth daily.    Olopatadine HCl (PAZEO) 0.7 % SOLN Place 1 drop into both eyes daily. 05/19/2018: Patient is taking as needed.    Omega-3 Fatty Acids (FISH OIL PO) Take 2 capsules by mouth daily.     Semaglutide (RYBELSUS) 14 MG TABS Take 1 tablet (14 mg total) by mouth daily.    telmisartan (MICARDIS) 20 MG tablet TAKE 1 TABLET DAILY    TRESIBA FLEXTOUCH 100 UNIT/ML FlexTouch Pen INJECT 20 UNITS UNDER THE SKIN DAILY (Patient taking differently: Patient is taking 12-20 units under the skin daily)    No facility-administered encounter medications on file as of 05/15/2021.    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   Allergic reaction 04/04/2018   Cramp and spasm 02/24/2018   History of food allergy 02/24/2018   Type 2 diabetes mellitus  without complication, with long-term current use of insulin ([place]) 02/24/2018   Insomnia 02/24/2018    Conditions to be addressed/monitored: Type 2 Diabetes, Hypertension, Insomnia, Mixed hyperlipidemia, Vitamin D deficiency  Care Plan : RN Care Manager Plan of Care  Updates made by Lynne Logan, RN since 05/15/2021 12:00 AM     Problem: No plan established for management of chronic disease states (Type 2 Diabetes, Hypertension, Insomnia, Mixed hyperlipidemia, Vitamin D deficiency)   Priority: High     Long-Range Goal: Development of plan of care  for chronic disease management (Type 2 Diabetes, Hypertension, Insomnia, Mixed hyperlipidemia, Vitamin D deficiency)   Start Date: 02/05/2021  Expected End Date: 02/05/2022  Recent Progress: On track  Priority: High  Note:   Current Barriers:  Knowledge Deficits related to plan of care for management of Type 2 Diabetes, Hypertension, Insomnia, Mixed hyperlipidemia, Vitamin D deficiency  Chronic Disease Management support and education needs related to Type 2 Diabetes, Hypertension, Insomnia, Mixed hyperlipidemia, Vitamin D deficiency   RNCM Clinical Goal(s):  Patient will verbalize basic understanding of  Type 2 Diabetes, Hypertension, Insomnia, Mixed hyperlipidemia, Vitamin D deficiency disease process and self health management plan as evidenced by patient will report having no exacerbations related to chronic disease states listed above  take all medications exactly as prescribed and will call provider for medication related questions as evidenced by patient will report no missed doses of her prescribed medications  demonstrate Improved health management independence as evidenced by patient will maintain the ability to remain independent with self care  continue to work with RN Care Manager to address care management and care coordination needs related to  Type 2 Diabetes, Hypertension, Insomnia, Mixed hyperlipidemia, Vitamin D deficiency as evidenced by adherence to CM Team Scheduled appointments demonstrate ongoing self health care management ability   as evidenced by    through collaboration with RN Care manager, provider, and care team.   Interventions: 1:1 collaboration with primary care provider regarding development and update of comprehensive plan of care as evidenced by provider attestation and co-signature Inter-disciplinary care team collaboration (see longitudinal plan of care) Evaluation of current treatment plan related to  self management and patient's adherence to plan as  established by provider  Diabetes Interventions:  (Status:  Goal on track:  Yes.) Long Term Goal Assessed patient's understanding of A1c goal: <7% Provided education to patient about basic DM disease process Reviewed medications with patient and discussed importance of medication adherence, discussed recent change from Ozempic to Rybelsus 14 mg daily and increase to [place] to 10 mg daily Educated patient on potential SE related to [place] such as increase in urination, increase risk for UTI and or yeast infections, reviewed signs/symptoms suggestive of these conditions and instructed patient to notify PCP promptly if symptoms occur for early diagnosis/treatment Provided patient with written educational materials related to hypo and hyperglycemia and importance of correct treatment Review of patient status, including review of consultants reports, relevant laboratory and other test results, and medications completed Educated on dietary and exercise recommendations; Technical sales engineer related to Chair Exercises; The Dangers in Skipping Meal; Carb choice list; Carb Counting Reviewed scheduled/upcoming provider appointments including: next PCP follow up appointment scheduled for 06/16/21 '@11' :66 AM  Educated on the importance scheduling eye exam for yearly check up and inspecting feet daily, reporting concerns or changes to skin integrity of feet promptly Lab Results  Component Value Date   HGBA1C 7.8 (H) 03/18/2021    Hyperlipidemia Interventions:  (Status:  Goal on track:  Yes.) Long Term Goal Medication review performed; medication list updated in electronic medical record.  Provider established cholesterol goals reviewed Counseled on importance of regular laboratory monitoring as prescribed Reviewed importance of limiting foods high in cholesterol Reviewed exercise goals and target of 150 minutes per week Mailed printed educational materials related to [employer] for  Cholesterol management  Discussed plans with patient for ongoing care management follow up and provided patient with direct contact information for care management team Lipid Panel     Component Value Date/Time   CHOL 162 03/18/2021 1554   TRIG 90 03/18/2021 1554   HDL 55 03/18/2021 1554   CHOLHDL 2.9 03/18/2021 1554   [place] 90 03/18/2021 1554   LABVLDL 17 03/18/2021 1554     Hypertension Interventions:  (Status:  Goal on track:  Yes.) Long Term Goal Last practice recorded BP readings:  BP Readings from Last 3 Encounters:  03/18/21 130/70  12/10/20 122/68  08/27/20 (!) 142/78  Most recent eGFR/CrCl:  Lab Results  Component Value Date   EGFR 81 03/18/2021    No components found for: CRCL Evaluation of current treatment plan related to hypertension self management and patient's adherence to plan as established by provider Review of patient status, including review of consultant's reports, relevant laboratory and other test results, and medications completed Reviewed medications with patient and discussed importance of medication adherence Advised patient, providing education and rationale, to monitor blood pressure daily and record, calling PCP for findings outside established parameters  Vitamin D deficiency Interventions:  (Status:  New goal.)  Long Term Goal Evaluation of current treatment plan related to  Vitamin D deficiency , self-management and patient's adherence to plan as established by provider Review of patient status, including review of consultant's reports, relevant laboratory and other test results, and medications completed Reviewed medications with patient and discussed importance of medication adherence Educated patient on basic disease process related to Vitamin D deficiency Educated on ways to improve Vitamin D, by taking Vitamin D exactly as prescribed, eat a Vitamin D rich diet, try to get at least 15 minutes of natural sunlight when able Determined patient  has yet to have a Dexa Scan, educated on guidelines and recommendations for this test and why it is necessary,encouraged patient to discuss with PCP at next scheduled visit Mailed patient educational materials related to Vitamin D deficiency  Discussed plans with patient for ongoing care management follow up and provided patient with direct contact information for care management team  Component Ref Range & Units 1 mo ago (03/18/21) 5 mo ago (12/10/20) 1 yr ago (11/23/19) 1 yr ago (05/17/19) 2 yr ago (05/19/18)  Vit D, 25-Hydroxy 30.0 - 100.0 ng/mL 35.3  32.2 CM  46.6 CM  86.3 CM  21.8 Low  CM   Patient Goals/Self-Care Activities: Take all medications as prescribed Attend all scheduled provider appointments Call pharmacy for medication refills 3-7 days in advance of running out of medications Perform all self care activities independently  Perform IADL's (shopping, preparing meals, housekeeping, managing finances) independently Call provider office for new concerns or questions  schedule appointment with eye doctor check feet daily for cuts, sores or redness fill half of plate with vegetables manage portion size check blood pressure 3 times per week keep a blood pressure log take blood pressure log to all doctor appointments call doctor for signs and symptoms of high blood pressure take medications for blood pressure exactly as prescribed report new symptoms to your  doctor adhere to prescribed diet: low saturated fat, low Cholesterol   develop an exercise routine  Follow Up Plan:  Telephone follow up appointment with care management team member scheduled for:  06/18/21      Barb Merino, RN, BSN, CCM Care Management Coordinator [place] [facility]  Direct Phone: [phone]

## 2021-05-21 ENCOUNTER — Other Ambulatory Visit: Payer: Self-pay

## 2021-05-21 ENCOUNTER — Ambulatory Visit (INDEPENDENT_AMBULATORY_CARE_PROVIDER_SITE_OTHER): Payer: Medicare Other | Admitting: Nurse Practitioner

## 2021-05-21 ENCOUNTER — Encounter: Payer: Self-pay | Admitting: Nurse Practitioner

## 2021-05-21 VITALS — BP 122/84 | HR 64 | Temp 98.7°F | Ht 64.0 in | Wt 181.8 lb

## 2021-05-21 DIAGNOSIS — R21 Rash and other nonspecific skin eruption: Secondary | ICD-10-CM | POA: Diagnosis not present

## 2021-05-21 DIAGNOSIS — M89319 Hypertrophy of bone, unspecified shoulder: Secondary | ICD-10-CM

## 2021-05-21 MED ORDER — HYDROCORTISONE 1 % EX CREA: TOPICAL_CREAM | CUTANEOUS | 1 refills | Status: AC

## 2021-05-21 NOTE — Progress Notes (Signed)
?I,Victoria T Hamilton,acting as a scribe for [name], FNP.,have documented all relevant documentation on the behalf of [name], FNP,as directed by  [name], FNP while in the presence of [name], [place].  ? ?This visit occurred during the SARS-CoV-2 public health emergency.  Safety protocols were in place, including screening questions prior to the visit, additional usage of staff PPE, and extensive cleaning of exam room while observing appropriate contact time as indicated for disinfecting solutions. ? ?Subjective:  ?  ? Patient ID: Jacqueline Bell , female    DOB: 09-Mar-1951 , 71 y.o.   MRN: [id number] ? ? ?Chief Complaint  ?Patient presents with  ? Rash  ? ? ?HPI ? ?Pt presents for rash on chest. She reports this is the second week of irritation. Neosporin & alcohol she has used, helped temporarily.  ?She feels the farxiga just previously started, contributes with the rash on chest.  ?She will sch DM eye exam soon.    ? ?Rash ?This is a new problem. The current episode started 1 to 4 weeks ago (2 weeks ago). Location: mid chest between breast. The rash is characterized by burning, itchiness and redness. Pertinent negatives include no anorexia. Past treatments include antibiotic cream (oil of argon).   ? ?Past Medical History:  ?Diagnosis Date  ? Diabetes mellitus without complication ([place])   ? Mixed hyperlipidemia 12/04/2020  ?  ? ?History reviewed. No pertinent family history. ? ? ?Current Outpatient Medications:  ?  atorvastatin (LIPITOR) 10 MG tablet, Take 1 tablet (10 mg total) by mouth daily., Disp: 90 tablet, Rfl: 0 ?  benzonatate (TESSALON PERLES) 100 MG capsule, Take 1 capsule (100 mg total) by mouth every 6 (six) hours as needed., Disp: 30 capsule, Rfl: 2 ?  cholecalciferol (VITAMIN D) 1000 units tablet, Take 1,000 Units by mouth daily., Disp: , Rfl:  ?  FARXIGA 5 MG TABS tablet, TAKE 1 TABLET DAILY BEFORE BREAKFAST, Disp: 90 tablet, Rfl: 3 ?  fluticasone (FLONASE) 50 MCG/ACT nasal spray,  Place 1 spray into both nostrils daily., Disp: 16 g, Rfl: 0 ?  hydrocortisone cream 1 %, Apply to affected area 2 times daily, Disp: 30 g, Rfl: 1 ?  insulin aspart (NOVOLOG FLEXPEN) 100 UNIT/ML FlexPen, Per SS: < 70 = 0; 70-90 = 4 units; 91-130 = 6 units; 131-150 = 7 units; 151-200 = 8 units; 201-250 = 9 units; 251-300 = 10 units; 301-350 = 11 units; 351-400 = 12 units; 401-450 = 13 units; > 450 = 14 units., Disp: 15 mL, Rfl: 11 ?  loratadine (CLARITIN) 10 MG tablet, TAKE 1 TABLET DAILY, Disp: 90 tablet, Rfl: 3 ?  magnesium oxide (MAG-OX) 400 MG tablet, Take 400 mg by mouth daily., Disp: , Rfl:  ?  meloxicam (MOBIC) 15 MG tablet, Take 1 tablet (15 mg total) by mouth as needed., Disp: 15 tablet, Rfl: 0 ?  Multiple Vitamin (MULTIVITAMIN) tablet, Take 1 tablet by mouth daily., Disp: , Rfl:  ?  Olopatadine HCl (PAZEO) 0.7 % SOLN, Place 1 drop into both eyes daily., Disp: 1 Bottle, Rfl: 5 ?  Omega-3 Fatty Acids (FISH OIL PO), Take 2 capsules by mouth daily. , Disp: , Rfl:  ?  Semaglutide (RYBELSUS) 14 MG TABS, Take 1 tablet (14 mg total) by mouth daily., Disp: 90 tablet, Rfl: 0 ?  telmisartan (MICARDIS) 20 MG tablet, TAKE 1 TABLET DAILY, Disp: 90 tablet, Rfl: 3 ?  TRESIBA FLEXTOUCH 100 UNIT/ML FlexTouch Pen, INJECT 20 UNITS UNDER THE SKIN DAILY (  Patient taking differently: Patient is taking 12-20 units under the skin daily), Disp: 15 mL, Rfl: 3 ?  EPINEPHrine 0.3 mg/0.3 mL IJ SOAJ injection, , Disp: , Rfl:   ? ?Allergies  ?Allergen Reactions  ? Iodine Hives  ? Shellfish Allergy Hives  ? Tramadol Rash  ?  ? ?Review of Systems  ?Constitutional: Negative.   ?Respiratory: Negative.    ?Cardiovascular: Negative.   ?Gastrointestinal:  Negative for anorexia.  ?Skin:  Positive for rash.  ?Neurological: Negative.   ?Psychiatric/Behavioral: Negative.     ? ?Today's Vitals  ? 05/21/21 1447  ?BP: 122/84  ?Pulse: 64  ?Temp: 98.7 ?F (37.1 ?C)  ?Weight: 181 lb 12.8 oz (82.5 kg)  ?Height: 5\' 4"  (1.626 m)  ? ?Body mass index is 31.21  kg/m?.  ?Wt Readings from Last 3 Encounters:  ?05/21/21 181 lb 12.8 oz (82.5 kg)  ?03/18/21 182 lb 9.6 oz (82.8 kg)  ?12/10/20 185 lb 3.2 oz (84 kg)  ?  ?Objective:  ?Physical Exam ?Vitals reviewed.  ?Constitutional:   ?   General: She is not in acute distress. ?   Appearance: Normal appearance.  ?Cardiovascular:  ?   Pulses: Normal pulses.  ?   Heart sounds: No murmur heard. ?Pulmonary:  ?   Effort: No respiratory distress.  ?Musculoskeletal:  ?   Comments: Right clavicle with larger firm area  ?Neurological:  ?   General: No focal deficit present.  ?   Mental Status: She is alert and oriented to person, place, and time.  ?   Cranial Nerves: No cranial nerve deficit.  ?   Motor: No weakness.  ?Psychiatric:     ?   Mood and Affect: Mood normal.     ?   Behavior: Behavior normal.     ?   Thought Content: Thought content normal.     ?   Judgment: Judgment normal.  ?  ? ?   ?Assessment And Plan:  ?   ?1. Rash ?Comments: I do not feel this rash is related to medication due to only on her chest.  ?- hydrocortisone cream 1 %; Apply to affected area 2 times daily  Dispense: 30 g; Refill: 1 ? ?2. Enlarged clavicle ?Comments: Firm increased area to right clavicle, may be calcified bone but will do xray.  ?- DG Clavicle Right; Future ?  ? ? ?Patient was given opportunity to ask questions. Patient verbalized understanding of the plan and was able to repeat key elements of the plan. All questions were answered to their satisfaction.  ?[name], FNP  ? ?I, [name], FNP, have reviewed all documentation for this visit. The documentation on 05/21/21 for the exam, diagnosis, procedures, and orders are all accurate and complete.  ? ?IF YOU HAVE BEEN REFERRED TO A SPECIALIST, IT MAY TAKE 1-2 WEEKS TO SCHEDULE/PROCESS THE REFERRAL. IF YOU HAVE NOT HEARD FROM US/SPECIALIST IN TWO WEEKS, PLEASE GIVE [place] A CALL AT [phone].  ? ?THE PATIENT IS ENCOURAGED TO PRACTICE SOCIAL DISTANCING DUE TO THE COVID-19 PANDEMIC.   ?

## 2021-06-04 ENCOUNTER — Telehealth: Payer: Medicare Other

## 2021-06-06 DIAGNOSIS — E782 Mixed hyperlipidemia: Secondary | ICD-10-CM | POA: Diagnosis not present

## 2021-06-06 DIAGNOSIS — E119 Type 2 diabetes mellitus without complications: Secondary | ICD-10-CM | POA: Diagnosis not present

## 2021-06-06 DIAGNOSIS — I10 Essential (primary) hypertension: Secondary | ICD-10-CM | POA: Diagnosis not present

## 2021-06-06 DIAGNOSIS — Z794 Long term (current) use of insulin: Secondary | ICD-10-CM

## 2021-06-16 ENCOUNTER — Ambulatory Visit (INDEPENDENT_AMBULATORY_CARE_PROVIDER_SITE_OTHER): Payer: Medicare Other | Admitting: Nurse Practitioner

## 2021-06-16 ENCOUNTER — Encounter: Payer: Self-pay | Admitting: Nurse Practitioner

## 2021-06-16 VITALS — BP 130/70 | HR 71 | Temp 98.7°F | Ht 64.0 in | Wt 179.0 lb

## 2021-06-16 DIAGNOSIS — Z6830 Body mass index (BMI) 30.0-30.9, adult: Secondary | ICD-10-CM

## 2021-06-16 DIAGNOSIS — G4700 Insomnia, unspecified: Secondary | ICD-10-CM | POA: Diagnosis not present

## 2021-06-16 DIAGNOSIS — Z794 Long term (current) use of insulin: Secondary | ICD-10-CM | POA: Diagnosis not present

## 2021-06-16 DIAGNOSIS — E1169 Type 2 diabetes mellitus with other specified complication: Secondary | ICD-10-CM | POA: Diagnosis not present

## 2021-06-16 DIAGNOSIS — M79675 Pain in left toe(s): Secondary | ICD-10-CM | POA: Diagnosis not present

## 2021-06-16 DIAGNOSIS — E119 Type 2 diabetes mellitus without complications: Secondary | ICD-10-CM | POA: Diagnosis not present

## 2021-06-16 DIAGNOSIS — I10 Essential (primary) hypertension: Secondary | ICD-10-CM | POA: Diagnosis not present

## 2021-06-16 DIAGNOSIS — E782 Mixed hyperlipidemia: Secondary | ICD-10-CM

## 2021-06-16 DIAGNOSIS — E6609 Other obesity due to excess calories: Secondary | ICD-10-CM | POA: Diagnosis not present

## 2021-06-16 DIAGNOSIS — E66811 Obesity, class 1: Secondary | ICD-10-CM

## 2021-06-16 MED ORDER — DAPAGLIFLOZIN PROPANEDIOL 10 MG PO TABS: 10.0000 mg | ORAL_TABLET | Freq: Every day | ORAL | 1 refills | Status: DC

## 2021-06-16 MED ORDER — KETOROLAC TROMETHAMINE 30 MG/ML IJ SOLN
30.0000 mg | Freq: Once | INTRAMUSCULAR | Status: AC
  Administered 2021-06-16: 30 mg via INTRAMUSCULAR

## 2021-06-16 NOTE — Patient Instructions (Signed)

## 2021-06-16 NOTE — Progress Notes (Signed)
?Industrial/product designer as a Education administrator for [employer], FNP.,have documented all relevant documentation on the behalf of [name], FNP,as directed by  [name], FNP while in the presence of [name], [place]. ? ?This visit occurred during the SARS-CoV-2 public health emergency.  Safety protocols were in place, including screening questions prior to the visit, additional usage of staff PPE, and extensive cleaning of exam room while observing appropriate contact time as indicated for disinfecting solutions. ? ?Subjective:  ?  ? Patient ID: Jacqueline Bell , female    DOB: 24-Jul-1950 , 71 y.o.   MRN: [id number] ? ? ?Chief Complaint  ?Patient presents with  ? Diabetes  ? ? ?HPI ? ?The patient is here for a follow-up on her diabetes, blood pressure, and vitamin d.  Her diabetes eye exam is not scheduled until July  ? ?Diabetes ?She presents for her follow-up diabetic visit. She has type 2 diabetes mellitus. Her disease course has been stable. Pertinent negatives for hypoglycemia include no confusion, dizziness or headaches. There are no diabetic associated symptoms. Pertinent negatives for diabetes include no chest pain, no fatigue, no polydipsia, no polyphagia, no polyuria and no weakness. There are no hypoglycemic complications. Symptoms are stable. There are no diabetic complications. Risk factors for coronary artery disease include sedentary lifestyle and obesity. Current diabetic treatment includes oral agent (dual therapy). She is compliant with treatment all of the time. Her weight is stable. She is following a generally healthy diet. When asked about meal planning, she reported none. She has not had a previous visit with a dietitian. She rarely participates in exercise. There is no change in her home blood glucose trend. (Blood sugar last night was 225 after having cake and lowest was in the 70's. ) An ACE inhibitor/angiotensin II receptor blocker is being taken. She does not see a podiatrist.Eye exam is  not current.  ?Hypertension ?This is a chronic problem. The current episode started more than 1 year ago. The problem is unchanged. The problem is uncontrolled. Pertinent negatives include no anxiety, chest pain, headaches, palpitations or shortness of breath. Risk factors for coronary artery disease include obesity, sedentary lifestyle and diabetes mellitus. Past treatments include ACE inhibitors. The current treatment provides mild improvement. There are no compliance problems.  There is no history of angina or kidney disease. There is no history of chronic renal disease.   ? ?Past Medical History:  ?Diagnosis Date  ? Diabetes mellitus without complication ([place])   ? Mixed hyperlipidemia 12/04/2020  ?  ? ?History reviewed. No pertinent family history. ? ? ?Current Outpatient Medications:  ?  atorvastatin (LIPITOR) 10 MG tablet, Take 1 tablet (10 mg total) by mouth daily., Disp: 90 tablet, Rfl: 0 ?  benzonatate (TESSALON PERLES) 100 MG capsule, Take 1 capsule (100 mg total) by mouth every 6 (six) hours as needed., Disp: 30 capsule, Rfl: 2 ?  cholecalciferol (VITAMIN D) 1000 units tablet, Take 1,000 Units by mouth daily., Disp: , Rfl:  ?  dapagliflozin propanediol (FARXIGA) 10 MG TABS tablet, Take 1 tablet (10 mg total) by mouth daily before breakfast., Disp: 90 tablet, Rfl: 1 ?  EPINEPHrine 0.3 mg/0.3 mL IJ SOAJ injection, , Disp: , Rfl:  ?  fluticasone (FLONASE) 50 MCG/ACT nasal spray, Place 1 spray into both nostrils daily., Disp: 16 g, Rfl: 0 ?  hydrocortisone cream 1 %, Apply to affected area 2 times daily, Disp: 30 g, Rfl: 1 ?  insulin aspart (NOVOLOG FLEXPEN) 100 UNIT/ML FlexPen, Per SS: < 70 =  0; 70-90 = 4 units; 91-130 = 6 units; 131-150 = 7 units; 151-200 = 8 units; 201-250 = 9 units; 251-300 = 10 units; 301-350 = 11 units; 351-400 = 12 units; 401-450 = 13 units; > 450 = 14 units., Disp: 15 mL, Rfl: 11 ?  loratadine (CLARITIN) 10 MG tablet, TAKE 1 TABLET DAILY, Disp: 90 tablet, Rfl: 3 ?  magnesium oxide  (MAG-OX) 400 MG tablet, Take 400 mg by mouth daily., Disp: , Rfl:  ?  meloxicam (MOBIC) 15 MG tablet, Take 1 tablet (15 mg total) by mouth as needed., Disp: 15 tablet, Rfl: 0 ?  Multiple Vitamin (MULTIVITAMIN) tablet, Take 1 tablet by mouth daily., Disp: , Rfl:  ?  Olopatadine HCl (PAZEO) 0.7 % SOLN, Place 1 drop into both eyes daily., Disp: 1 Bottle, Rfl: 5 ?  Omega-3 Fatty Acids (FISH OIL PO), Take 2 capsules by mouth daily. , Disp: , Rfl:  ?  Semaglutide (RYBELSUS) 14 MG TABS, Take 1 tablet (14 mg total) by mouth daily., Disp: 90 tablet, Rfl: 0 ?  telmisartan (MICARDIS) 20 MG tablet, TAKE 1 TABLET DAILY, Disp: 90 tablet, Rfl: 3 ?  TRESIBA FLEXTOUCH 100 UNIT/ML FlexTouch Pen, INJECT 20 UNITS UNDER THE SKIN DAILY (Patient taking differently: Patient is taking 12-20 units under the skin daily), Disp: 15 mL, Rfl: 3  ? ?Allergies  ?Allergen Reactions  ? Iodine Hives  ? Shellfish Allergy Hives  ? Tramadol Rash  ?  ? ?Review of Systems  ?Constitutional: Negative.  Negative for fatigue.  ?Respiratory: Negative.  Negative for shortness of breath.   ?Cardiovascular: Negative.  Negative for chest pain and palpitations.  ?Gastrointestinal: Negative.   ?Endocrine: Negative for polydipsia, polyphagia and polyuria.  ?Musculoskeletal:   ?     Left great toe pain and swelling to medial toe  ?Neurological: Negative.  Negative for dizziness, weakness and headaches.  ?Psychiatric/Behavioral:  Negative for confusion.    ? ?Today's Vitals  ? 06/16/21 1151  ?BP: 130/70  ?Pulse: 71  ?Temp: 98.7 ?F (37.1 ?C)  ?TempSrc: Oral  ?Weight: 179 lb (81.2 kg)  ?Height: 5' 4" (1.626 m)  ? ?Body mass index is 30.73 kg/m?.  ?Wt Readings from Last 3 Encounters:  ?06/16/21 179 lb (81.2 kg)  ?05/21/21 181 lb 12.8 oz (82.5 kg)  ?03/18/21 182 lb 9.6 oz (82.8 kg)  ? ? ?Objective:  ?Physical Exam ?Vitals reviewed.  ?Constitutional:   ?   General: She is not in acute distress. ?   Appearance: Normal appearance. She is obese.  ?Cardiovascular:  ?   Rate and  Rhythm: Normal rate and regular rhythm.  ?   Pulses: Normal pulses.  ?   Heart sounds: Normal heart sounds. No murmur heard. ?Pulmonary:  ?   Effort: Pulmonary effort is normal. No respiratory distress.  ?   Breath sounds: Normal breath sounds. No wheezing.  ?Musculoskeletal:     ?   General: No swelling or tenderness. Normal range of motion.  ?   Cervical back: Normal range of motion and neck supple.  ?   Right lower leg: No edema.  ?   Left lower leg: No edema.  ?   Comments: She is limping slightly when walking  ?Skin: ?   General: Skin is warm and dry.  ?   Capillary Refill: Capillary refill takes less than 2 seconds.  ?Neurological:  ?   General: No focal deficit present.  ?   Mental Status: She is alert and oriented to person, place,   and time.  ?   Cranial Nerves: No cranial nerve deficit.  ?   Motor: No weakness.  ?Psychiatric:     ?   Mood and Affect: Mood normal.     ?   Behavior: Behavior normal.     ?   Thought Content: Thought content normal.     ?   Judgment: Judgment normal.  ?  ? ?   ?Assessment And Plan:  ?   ?1. Type 2 diabetes mellitus without complication, with long-term current use of insulin (HCC) ?Comments: HgbA1c is slightly improved, continue insulin ?- Comprehensive metabolic panel with eGFR ?- Renal function panel with eGFR ? ?2. Essential hypertension ?Comments: Blood pressure fairly controlled, continue current medications ? ?3. Mixed hyperlipidemia ?Comments: Cholesterol levels are normal, continue statin ? ?4. Insomnia, unspecified type ? ?5. Great toe pain, left ?Comments: Sudden onset great toe pain is suspicious of gout will check uric acid. With her history or DM and HTN she is at high risk.  ?- Uric acid ?- ketorolac (TORADOL) 30 MG/ML injection 30 mg ? ?6. Class 1 obesity due to excess calories with serious comorbidity and body mass index (BMI) of 30.0 to 30.9 in adult ? She is encouraged to strive for BMI less than 30 to decrease cardiac risk. Advised to aim for at least 150  minutes of exercise per week. ? ? ? ?Patient was given opportunity to ask questions. Patient verbalized understanding of the plan and was able to repeat key elements of the plan. All questions were answered to

## 2021-06-17 LAB — COMPREHENSIVE METABOLIC PANEL
ALT: 13 IU/L (ref 0–32)
AST: 19 IU/L (ref 0–40)
Albumin/Globulin Ratio: 1.5 (ref 1.2–2.2)
Albumin: 4.6 g/dL (ref 3.8–4.8)
Alkaline Phosphatase: 53 IU/L (ref 44–121)
BUN/Creatinine Ratio: 13 (ref 12–28)
BUN: 11 mg/dL (ref 8–27)
Bilirubin Total: 0.4 mg/dL (ref 0.0–1.2)
CO2: 26 mmol/L (ref 20–29)
Calcium: 9.5 mg/dL (ref 8.7–10.3)
Chloride: 101 mmol/L (ref 96–106)
Creatinine, Ser: 0.82 mg/dL (ref 0.57–1.00)
Globulin, Total: 3 g/dL (ref 1.5–4.5)
Glucose: 148 mg/dL — ABNORMAL HIGH (ref 70–99)
Potassium: 4.5 mmol/L (ref 3.5–5.2)
Sodium: 138 mmol/L (ref 134–144)
Total Protein: 7.6 g/dL (ref 6.0–8.5)
eGFR: 77 mL/min/{1.73_m2} (ref >59)

## 2021-06-17 LAB — RENAL FUNCTION PANEL: Phosphorus: 3.8 mg/dL (ref 3.0–4.3)

## 2021-06-17 LAB — URIC ACID: Uric Acid: 2.9 mg/dL — ABNORMAL LOW (ref 3.0–7.2)

## 2021-06-18 ENCOUNTER — Ambulatory Visit (INDEPENDENT_AMBULATORY_CARE_PROVIDER_SITE_OTHER): Payer: Medicare Other

## 2021-06-18 ENCOUNTER — Telehealth: Payer: Medicare Other

## 2021-06-18 DIAGNOSIS — G4700 Insomnia, unspecified: Secondary | ICD-10-CM

## 2021-06-18 DIAGNOSIS — E119 Type 2 diabetes mellitus without complications: Secondary | ICD-10-CM

## 2021-06-18 DIAGNOSIS — E782 Mixed hyperlipidemia: Secondary | ICD-10-CM

## 2021-06-18 DIAGNOSIS — E559 Vitamin D deficiency, unspecified: Secondary | ICD-10-CM

## 2021-06-18 DIAGNOSIS — I10 Essential (primary) hypertension: Secondary | ICD-10-CM

## 2021-06-18 NOTE — Patient Instructions (Signed)
Visit Information ? ?Thank you for taking time to visit with me today. Please don't hesitate to contact me if I can be of assistance to you before our next scheduled telephone appointment. ? ?Following are the goals we discussed today:  ?(Copy and paste patient goals from clinical care plan here) ? ?Our next appointment is by telephone on 08/18/21 at 11:15 AM ? ?Please call the care guide team at [phone] if you need to cancel or reschedule your appointment.  ? ?If you are experiencing a Mental Health or Behavioral Health Crisis or need someone to talk to, please call 1-800-273-TALK (toll free, 24 hour hotline)  ? ?Patient verbalizes understanding of instructions and care plan provided today and agrees to view in MyChart. Active MyChart status confirmed with patient.   ? ?Delsa Sale, RN, BSN, CCM ?Care Management Coordinator ?[facility]  ?Direct Phone: [phone] ? ? ?

## 2021-06-18 NOTE — Chronic Care Management (AMB) (Signed)
?Chronic Care Management  ? ?CCM RN Visit Note ? ?06/18/2021 ?Name: Jacqueline Bell MRN: [id number] DOB: 1951-03-07 ? ?Subjective: ?Jacqueline Bell is a 71 y.o. year old female who is a primary care patient of [name], [place]. The care management team was consulted for assistance with disease management and care coordination needs.   ? ?Engaged with patient by telephone for follow up visit in response to provider referral for case management and/or care coordination services.  ? ?Consent to Services:  ?The patient was given information about Chronic Care Management services, agreed to services, and gave verbal consent prior to initiation of services.  Please see initial visit note for detailed documentation.  ? ?Patient agreed to services and verbal consent obtained.  ? ?Assessment: Review of patient past medical history, allergies, medications, health status, including review of consultants reports, laboratory and other test data, was performed as part of comprehensive evaluation and provision of chronic care management services.  ? ?SDOH (Social Determinants of Health) assessments and interventions performed:  Yes, no acute challenges ? ?CCM Care Plan ? ?Allergies  ?Allergen Reactions  ? Iodine Hives  ? Shellfish Allergy Hives  ? Tramadol Rash  ? ? ?Outpatient Encounter Medications as of 06/18/2021  ?Medication Sig Note  ? atorvastatin (LIPITOR) 10 MG tablet Take 1 tablet (10 mg total) by mouth daily.   ? benzonatate (TESSALON PERLES) 100 MG capsule Take 1 capsule (100 mg total) by mouth every 6 (six) hours as needed.   ? cholecalciferol (VITAMIN D) 1000 units tablet Take 1,000 Units by mouth daily.   ? dapagliflozin propanediol (FARXIGA) 10 MG TABS tablet Take 1 tablet (10 mg total) by mouth daily before breakfast.   ? EPINEPHrine 0.3 mg/0.3 mL IJ SOAJ injection    ? fluticasone (FLONASE) 50 MCG/ACT nasal spray Place 1 spray into both nostrils daily.   ? hydrocortisone cream 1 % Apply to affected area 2 times  daily   ? insulin aspart (NOVOLOG FLEXPEN) 100 UNIT/ML FlexPen Per SS: < 70 = 0; 70-90 = 4 units; 91-130 = 6 units; 131-150 = 7 units; 151-200 = 8 units; 201-250 = 9 units; 251-300 = 10 units; 301-350 = 11 units; 351-400 = 12 units; 401-450 = 13 units; > 450 = 14 units.   ? loratadine (CLARITIN) 10 MG tablet TAKE 1 TABLET DAILY   ? magnesium oxide (MAG-OX) 400 MG tablet Take 400 mg by mouth daily.   ? meloxicam (MOBIC) 15 MG tablet Take 1 tablet (15 mg total) by mouth as needed.   ? Multiple Vitamin (MULTIVITAMIN) tablet Take 1 tablet by mouth daily.   ? Olopatadine HCl (PAZEO) 0.7 % SOLN Place 1 drop into both eyes daily. 05/19/2018: Patient is taking as needed.   ? Omega-3 Fatty Acids (FISH OIL PO) Take 2 capsules by mouth daily.    ? Semaglutide (RYBELSUS) 14 MG TABS Take 1 tablet (14 mg total) by mouth daily.   ? telmisartan (MICARDIS) 20 MG tablet TAKE 1 TABLET DAILY   ? TRESIBA FLEXTOUCH 100 UNIT/ML FlexTouch Pen INJECT 20 UNITS UNDER THE SKIN DAILY (Patient taking differently: Patient is taking 12-20 units under the skin daily)   ? ?No facility-administered encounter medications on file as of 06/18/2021.  ? ? ?Patient Active Problem List  ? Diagnosis Date Noted  ? Mixed hyperlipidemia 12/04/2020  ? Leg mass, right 06/13/2020  ? Seasonal and perennial allergic rhinitis 04/04/2018  ? Allergic conjunctivitis 04/04/2018  ? 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 Allergic reaction  04/04/2018  ? Cramp and spasm 02/24/2018  ? History of food allergy 02/24/2018  ? Type 2 diabetes mellitus without complication, with long-term current use of insulin ([place]) 02/24/2018  ? Insomnia 02/24/2018  ? ? ?Conditions to be addressed/monitored: Type 2 Diabetes, Hypertension, Insomnia, Mixed hyperlipidemia, Vitamin D deficiency ? ?Care Plan : RN Care Manager Plan of Care  ?Updates made by Lynne Logan, RN since 06/18/2021 12:00 AM  ?  ? ?Problem: No plan established for management of chronic disease states (Type 2 Diabetes,  Hypertension, Insomnia, Mixed hyperlipidemia, Vitamin D deficiency)   ?Priority: High  ?  ? ?Long-Range Goal: Development of plan of care for chronic disease management (Type 2 Diabetes, Hypertension, Insomnia, Mixed hyperlipidemia, Vitamin D deficiency)   ?Start Date: 02/05/2021  ?Expected End Date: 02/05/2022  ?Recent Progress: On track  ?Priority: High  ?Note:   ?Current Barriers:  ?Knowledge Deficits related to plan of care for management of Type 2 Diabetes, Hypertension, Insomnia, Mixed hyperlipidemia, Vitamin D deficiency  ?Chronic Disease Management support and education needs related to Type 2 Diabetes, Hypertension, Insomnia, Mixed hyperlipidemia, Vitamin D deficiency  ? ?RNCM Clinical Goal(s):  ?Patient will verbalize basic understanding of  Type 2 Diabetes, Hypertension, Insomnia, Mixed hyperlipidemia, Vitamin D deficiency disease process and self health management plan as evidenced by patient will report having no exacerbations related to chronic disease states listed above  ?take all medications exactly as prescribed and will call provider for medication related questions as evidenced by patient will report no missed doses of her prescribed medications  ?demonstrate Improved health management independence as evidenced by patient will maintain the ability to remain independent with self care  ?continue to work with RN Care Manager to address care management and care coordination needs related to  Type 2 Diabetes, Hypertension, Insomnia, Mixed hyperlipidemia, Vitamin D deficiency as evidenced by adherence to CM Team Scheduled appointments ?demonstrate ongoing self health care management ability   as evidenced by    through collaboration with RN Care manager, provider, and care team.  ? ?Interventions: ?1:1 collaboration with primary care provider regarding development and update of comprehensive plan of care as evidenced by provider attestation and co-signature ?Inter-disciplinary care team collaboration  (see longitudinal plan of care) ?Evaluation of current treatment plan related to  self management and patient's adherence to plan as established by provider ? ?Diabetes Interventions:  (Status:  Goal on track:  Yes.) Long Term Goal ?Assessed patient's understanding of A1c goal: <7% ?Provided education to patient about basic DM disease process ?Review of patient status, including review of consultants reports, relevant laboratory and other test results, and medications completed ?Reviewed medications with patient and discussed importance of medication adherence, discussed recent change from Ozempic to Rybelsus 14 mg daily and increase to [place] to 10 mg daily ?Answered patient's questions concerning how to take Farxiga per manufacturer PI ?Reviewed daily glycemic control, mailed printed educational material related to Hypo/Hyperglycemia and how to self manage  ?Reinforced importance of daily foot inspections, reporting concerns or changes to skin integrity of feet promptly ?Collaborated with PCP regarding Podiatry referral needed ?Confirmed patient scheduled her eye exam for July  ?Lab Results  ?Component Value Date  ? HGBA1C 7.8 (H) 03/18/2021  ?  ?Hyperlipidemia Interventions:  (Status:  Condition stable.  Not addressed this visit.) Long Term Goal ?Medication review performed; medication list updated in electronic medical record.  ?Provider established cholesterol goals reviewed ?Counseled on importance of regular laboratory monitoring as prescribed ?Reviewed importance of limiting foods high  in cholesterol ?Reviewed exercise goals and target of 150 minutes per week ?Mailed printed educational materials related to [employer] on F[employer] for Cholesterol management  ?Discussed plans with patient for ongoing care management follow up and provided patient with direct contact information for care management team ?Lipid Panel  ?   ?Component Value Date/Time  ? CHOL 162 03/18/2021 1554  ? TRIG 90 03/18/2021 1554  ? HDL 55  03/18/2021 1554  ? CHOLHDL 2.9 03/18/2021 1554  ? [place] 90 03/18/2021 1554  ? LABVLDL 17 03/18/2021 1554  ?  ? ?Hypertension Interventions:  (Status:  Goal on track:  Yes.) Long Term Goal ?Last practice recorded B

## 2021-06-30 DIAGNOSIS — Z1231 Encounter for screening mammogram for malignant neoplasm of breast: Secondary | ICD-10-CM | POA: Diagnosis not present

## 2021-07-02 ENCOUNTER — Other Ambulatory Visit: Payer: Self-pay

## 2021-07-03 NOTE — Progress Notes (Signed)
She needs to go for the xray at [address] she can walk in and does not need an appt. The order is in ? ? ? ?

## 2021-07-06 DIAGNOSIS — Z794 Long term (current) use of insulin: Secondary | ICD-10-CM | POA: Diagnosis not present

## 2021-07-06 DIAGNOSIS — E119 Type 2 diabetes mellitus without complications: Secondary | ICD-10-CM

## 2021-07-06 DIAGNOSIS — I10 Essential (primary) hypertension: Secondary | ICD-10-CM

## 2021-07-06 DIAGNOSIS — E782 Mixed hyperlipidemia: Secondary | ICD-10-CM

## 2021-07-16 ENCOUNTER — Telehealth: Payer: Self-pay

## 2021-07-16 NOTE — Chronic Care Management (AMB) (Addendum)
    Called Jacqueline Bell, No answer, No voicemail for appointment reminder on 07-18-2021 at 11:00 via telephone visit with Cherylin Mylar, Pharm D. Notified to have all medications, supplements, blood pressure and/or blood sugar logs available during appointment and to return call if need to reschedule.   Huey Romans [facility] Clinical Pharmacist Assistant [phone]

## 2021-07-17 DIAGNOSIS — R922 Inconclusive mammogram: Secondary | ICD-10-CM | POA: Diagnosis not present

## 2021-07-17 DIAGNOSIS — N6489 Other specified disorders of breast: Secondary | ICD-10-CM | POA: Diagnosis not present

## 2021-07-17 LAB — HM MAMMOGRAPHY

## 2021-07-18 ENCOUNTER — Telehealth: Payer: Medicare Other

## 2021-08-08 ENCOUNTER — Encounter: Payer: Self-pay | Admitting: Nurse Practitioner

## 2021-08-18 ENCOUNTER — Ambulatory Visit (INDEPENDENT_AMBULATORY_CARE_PROVIDER_SITE_OTHER): Payer: Medicare Other

## 2021-08-18 ENCOUNTER — Telehealth: Payer: Medicare Other

## 2021-08-18 DIAGNOSIS — E559 Vitamin D deficiency, unspecified: Secondary | ICD-10-CM

## 2021-08-18 DIAGNOSIS — G4700 Insomnia, unspecified: Secondary | ICD-10-CM

## 2021-08-18 DIAGNOSIS — I10 Essential (primary) hypertension: Secondary | ICD-10-CM

## 2021-08-18 DIAGNOSIS — E119 Type 2 diabetes mellitus without complications: Secondary | ICD-10-CM

## 2021-08-18 DIAGNOSIS — E782 Mixed hyperlipidemia: Secondary | ICD-10-CM

## 2021-08-18 NOTE — Chronic Care Management (AMB) (Signed)
Chronic Care Management   CCM RN Visit Note  08/18/2021 Name: Jacqueline Bell MRN: [id number] DOB: 07/01/1950  Subjective: Jacqueline Bell is a 71 y.o. year old female who is a primary care patient of [name], [place]. The care management team was consulted for assistance with disease management and care coordination needs.    Engaged with patient by telephone for follow up visit in response to provider referral for case management and/or care coordination services.   Consent to Services:  The patient was given information about Chronic Care Management services, agreed to services, and gave verbal consent prior to initiation of services.  Please see initial visit note for detailed documentation.   Patient agreed to services and verbal consent obtained.   Assessment: Review of patient past medical history, allergies, medications, health status, including review of consultants reports, laboratory and other test data, was performed as part of comprehensive evaluation and provision of chronic care management services.   SDOH (Social Determinants of Health) assessments and interventions performed: yes, no acute needs    CCM Care Plan  Allergies  Allergen Reactions   Iodine Hives   Shellfish Allergy Hives   Tramadol Rash    Outpatient Encounter Medications as of 08/18/2021  Medication Sig Note   atorvastatin (LIPITOR) 10 MG tablet Take 1 tablet (10 mg total) by mouth daily.    benzonatate (TESSALON PERLES) 100 MG capsule Take 1 capsule (100 mg total) by mouth every 6 (six) hours as needed.    cholecalciferol (VITAMIN D) 1000 units tablet Take 1,000 Units by mouth daily.    dapagliflozin propanediol (FARXIGA) 10 MG TABS tablet Take 1 tablet (10 mg total) by mouth daily before breakfast.    EPINEPHrine 0.3 mg/0.3 mL IJ SOAJ injection     fluticasone (FLONASE) 50 MCG/ACT nasal spray Place 1 spray into both nostrils daily.    hydrocortisone cream 1 % Apply to affected area 2 times daily     insulin aspart (NOVOLOG FLEXPEN) 100 UNIT/ML FlexPen Per SS: < 70 = 0; 70-90 = 4 units; 91-130 = 6 units; 131-150 = 7 units; 151-200 = 8 units; 201-250 = 9 units; 251-300 = 10 units; 301-350 = 11 units; 351-400 = 12 units; 401-450 = 13 units; > 450 = 14 units.    loratadine (CLARITIN) 10 MG tablet TAKE 1 TABLET DAILY    magnesium oxide (MAG-OX) 400 MG tablet Take 400 mg by mouth daily.    meloxicam (MOBIC) 15 MG tablet Take 1 tablet (15 mg total) by mouth as needed.    Multiple Vitamin (MULTIVITAMIN) tablet Take 1 tablet by mouth daily.    Olopatadine HCl (PAZEO) 0.7 % SOLN Place 1 drop into both eyes daily. 05/19/2018: Patient is taking as needed.    Omega-3 Fatty Acids (FISH OIL PO) Take 2 capsules by mouth daily.     telmisartan (MICARDIS) 20 MG tablet TAKE 1 TABLET DAILY    TRESIBA FLEXTOUCH 100 UNIT/ML FlexTouch Pen INJECT 20 UNITS UNDER THE SKIN DAILY (Patient taking differently: Patient is taking 12-20 units under the skin daily)    No facility-administered encounter medications on file as of 08/18/2021.    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   Allergic reaction 04/04/2018   Cramp and spasm 02/24/2018   History of food allergy 02/24/2018  Type 2 diabetes mellitus without complication, with long-term current use of insulin ([place]) 02/24/2018   Insomnia 02/24/2018    Conditions to be addressed/monitored: Type 2 Diabetes, Hypertension, Insomnia, Mixed hyperlipidemia, Vitamin D deficiency  Care Plan : RN Care Manager Plan of Care  Updates made by Lynne Logan, RN since 08/18/2021 12:00 AM     Problem: No plan established for management of chronic disease states (Type 2 Diabetes, Hypertension, Insomnia, Mixed hyperlipidemia, Vitamin D deficiency)   Priority: High      Long-Range Goal: Development of plan of care for chronic disease management (Type 2 Diabetes, Hypertension, Insomnia, Mixed hyperlipidemia, Vitamin D deficiency)   Start Date: 02/05/2021  Expected End Date: 02/05/2022  Recent Progress: On track  Priority: High  Note:   Current Barriers:  Knowledge Deficits related to plan of care for management of Type 2 Diabetes, Hypertension, Insomnia, Mixed hyperlipidemia, Vitamin D deficiency  Chronic Disease Management support and education needs related to Type 2 Diabetes, Hypertension, Insomnia, Mixed hyperlipidemia, Vitamin D deficiency   RNCM Clinical Goal(s):  Patient will verbalize basic understanding of  Type 2 Diabetes, Hypertension, Insomnia, Mixed hyperlipidemia, Vitamin D deficiency disease process and self health management plan as evidenced by patient will report having no exacerbations related to chronic disease states listed above  take all medications exactly as prescribed and will call provider for medication related questions as evidenced by patient will report no missed doses of her prescribed medications  demonstrate Improved health management independence as evidenced by patient will maintain the ability to remain independent with self care  continue to work with RN Care Manager to address care management and care coordination needs related to  Type 2 Diabetes, Hypertension, Insomnia, Mixed hyperlipidemia, Vitamin D deficiency as evidenced by adherence to CM Team Scheduled appointments demonstrate ongoing self health care management ability   as evidenced by    through collaboration with RN Care manager, provider, and care team.   Interventions: 1:1 collaboration with primary care provider regarding development and update of comprehensive plan of care as evidenced by provider attestation and co-signature Inter-disciplinary care team collaboration (see longitudinal plan of care) Evaluation of current treatment plan related to  self  management and patient's adherence to plan as established by provider  Diabetes Interventions:  (Status:  Goal on track:  Yes.) Long Term Goal Assessed patient's understanding of A1c goal: <7% Reviewed and discussed daily glycemic control with patient, FBS 80-130, <180 after meals  Determined patient continues to self monitor her blood sugars daily, she reports an average of 80-130, denies having hypoglycemic events Reiterated the importance of daily exercise, determined patient has increased her walking Positive reinforcement provided to patient for making lifestyle changes to improve her overall health and her diabetes  Lab Results  Component Value Date   HGBA1C 7.8 (H) 03/18/2021    Hyperlipidemia Interventions:  (Status:  Condition stable.  Not addressed this visit.) Long Term Goal Medication review performed; medication list updated in electronic medical record.  Provider established cholesterol goals reviewed Counseled on importance of regular laboratory monitoring as prescribed Reviewed importance of limiting foods high in cholesterol Reviewed exercise goals and target of 150 minutes per week Mailed printed educational materials related to [employer] for Cholesterol management  Discussed plans with patient for ongoing care management follow up and provided patient with direct contact information for care management team Lipid Panel     Component Value Date/Time   CHOL 162 03/18/2021 1554   TRIG 90 03/18/2021  1554   HDL 55 03/18/2021 1554   CHOLHDL 2.9 03/18/2021 1554   LDLCALC 90 03/18/2021 1554   LABVLDL 17 03/18/2021 1554     Hypertension Interventions:  (Status:  Goal on track:  Yes.) Long Term Goal Last practice recorded BP readings:  BP Readings from Last 3 Encounters:  06/16/21 130/70  05/21/21 122/84  03/18/21 130/70  Most recent eGFR/CrCl:  Lab Results  Component Value Date   EGFR 77 06/16/2021    No components found for: CRCL Evaluation of current  treatment plan related to hypertension self management and patient's adherence to plan as established by provider Counseled on the importance of exercise goals with target of 150 minutes per week Advised patient, providing education and rationale, to monitor blood pressure daily and record, calling PCP for findings outside established parameters Provided education on prescribed diet low Sodium Provided written and verbal instructions on how to accurately monitor her BP at home  Educated on target BP <130/80 Mailed printed educational materials related to What is High Blood Pressure?  Reviewed and discussed next PCP office visit scheduled with [name] FNP for 10/22/21 _0 :20 AM  Discussed plans with patient for ongoing care management follow up and provided patient with direct contact information for care management team   Vitamin D deficiency Interventions:  (Status:  Condition stable.  Not addressed this visit.)  Long Term Goal Evaluation of current treatment plan related to  Vitamin D deficiency , self-management and patient's adherence to plan as established by provider Review of patient status, including review of consultant's reports, relevant laboratory and other test results, and medications completed Reviewed medications with patient and discussed importance of medication adherence Educated patient on basic disease process related to Vitamin D deficiency Educated on ways to improve Vitamin D, by taking Vitamin D exactly as prescribed, eat a Vitamin D rich diet, try to get at least 15 minutes of natural sunlight when able Determined patient has yet to have a Dexa Scan, educated on guidelines and recommendations for this test and why it is necessary,encouraged patient to discuss with PCP at next scheduled visit Mailed patient educational materials related to Vitamin D deficiency  Discussed plans with patient for ongoing care management follow up and provided patient with direct contact  information for care management team  Component Ref Range & Units 1 mo ago (03/18/21) 5 mo ago (12/10/20) 1 yr ago (11/23/19) 1 yr ago (05/17/19) 2 yr ago (05/19/18)  Vit D, 25-Hydroxy 30.0 - 100.0 ng/mL 35.3  32.2 CM  46.6 CM  86.3 CM  21.8 Low  CM   Patient Goals/Self-Care Activities: Take all medications as prescribed Attend all scheduled provider appointments Call pharmacy for medication refills 3-7 days in advance of running out of medications Perform all self care activities independently  Perform IADL's (shopping, preparing meals, housekeeping, managing finances) independently Call provider office for new concerns or questions  keep appointment with eye doctor check blood sugar at prescribed times: before meals and at bedtime check feet daily for cuts, sores or redness fill half of plate with vegetables manage portion size check blood pressure 3 times per week keep a blood pressure log take blood pressure log to all doctor appointments call doctor for signs and symptoms of high blood pressure take medications for blood pressure exactly as prescribed report new symptoms to your doctor adhere to prescribed diet: low saturated fat, low Cholesterol   develop an exercise routine  Follow Up Plan:  Telephone follow up appointment with care  management team member scheduled for:  10/24/21     Barb Merino, RN, BSN, CCM Care Management Coordinator [facility]  Direct Phone: [phone]

## 2021-08-18 NOTE — Patient Instructions (Signed)
Visit Information  Thank you for taking time to visit with me today. Please don't hesitate to contact me if I can be of assistance to you before our next scheduled telephone appointment.  Following are the goals we discussed today:  (Copy and paste patient goals from clinical care plan here)  Our next appointment is by telephone on 10/24/21 at 12:45 PM   Please call the care guide team at [phone] if you need to cancel or reschedule your appointment.   If you are experiencing a Mental Health or Behavioral Health Crisis or need someone to talk to, please call 1-800-273-TALK (toll free, 24 hour hotline)   Patient verbalizes understanding of instructions and care plan provided today and agrees to view in MyChart. Active MyChart status and patient understanding of how to access instructions and care plan via MyChart confirmed with patient.     Delsa Sale, RN, BSN, CCM Care Management Coordinator [facility]  Direct Phone: [phone]

## 2021-09-05 DIAGNOSIS — Z794 Long term (current) use of insulin: Secondary | ICD-10-CM | POA: Diagnosis not present

## 2021-09-05 DIAGNOSIS — E785 Hyperlipidemia, unspecified: Secondary | ICD-10-CM | POA: Diagnosis not present

## 2021-09-05 DIAGNOSIS — E1159 Type 2 diabetes mellitus with other circulatory complications: Secondary | ICD-10-CM | POA: Diagnosis not present

## 2021-09-05 DIAGNOSIS — I10 Essential (primary) hypertension: Secondary | ICD-10-CM | POA: Diagnosis not present

## 2021-09-10 ENCOUNTER — Other Ambulatory Visit: Payer: Self-pay

## 2021-09-10 ENCOUNTER — Other Ambulatory Visit: Payer: Self-pay | Admitting: Nurse Practitioner

## 2021-09-10 DIAGNOSIS — Z794 Long term (current) use of insulin: Secondary | ICD-10-CM

## 2021-09-10 DIAGNOSIS — I10 Essential (primary) hypertension: Secondary | ICD-10-CM

## 2021-09-10 MED ORDER — RYBELSUS 14 MG PO TABS
1.0000 | ORAL_TABLET | Freq: Every day | ORAL | 3 refills | Status: DC
Start: 2021-09-10 — End: 2021-12-10

## 2021-09-10 MED ORDER — TELMISARTAN 20 MG PO TABS: 20.0000 mg | ORAL_TABLET | Freq: Every day | ORAL | 3 refills | Status: DC

## 2021-10-01 ENCOUNTER — Encounter (INDEPENDENT_AMBULATORY_CARE_PROVIDER_SITE_OTHER): Payer: Self-pay

## 2021-10-01 DIAGNOSIS — H35372 Puckering of macula, left eye: Secondary | ICD-10-CM | POA: Diagnosis not present

## 2021-10-01 DIAGNOSIS — E113313 Type 2 diabetes mellitus with moderate nonproliferative diabetic retinopathy with macular edema, bilateral: Secondary | ICD-10-CM | POA: Diagnosis not present

## 2021-10-01 DIAGNOSIS — H26492 Other secondary cataract, left eye: Secondary | ICD-10-CM | POA: Diagnosis not present

## 2021-10-01 DIAGNOSIS — H04123 Dry eye syndrome of bilateral lacrimal glands: Secondary | ICD-10-CM | POA: Diagnosis not present

## 2021-10-01 DIAGNOSIS — Z961 Presence of intraocular lens: Secondary | ICD-10-CM | POA: Diagnosis not present

## 2021-10-01 DIAGNOSIS — H1045 Other chronic allergic conjunctivitis: Secondary | ICD-10-CM | POA: Diagnosis not present

## 2021-10-01 LAB — HM DIABETES EYE EXAM

## 2021-10-02 ENCOUNTER — Other Ambulatory Visit: Payer: Self-pay | Admitting: Nurse Practitioner

## 2021-10-07 ENCOUNTER — Ambulatory Visit (INDEPENDENT_AMBULATORY_CARE_PROVIDER_SITE_OTHER): Payer: Medicare Other | Admitting: Ophthalmology

## 2021-10-07 ENCOUNTER — Encounter (INDEPENDENT_AMBULATORY_CARE_PROVIDER_SITE_OTHER): Payer: Medicare Other | Admitting: Ophthalmology

## 2021-10-07 ENCOUNTER — Encounter (INDEPENDENT_AMBULATORY_CARE_PROVIDER_SITE_OTHER): Payer: Self-pay | Admitting: Ophthalmology

## 2021-10-07 DIAGNOSIS — E113391 Type 2 diabetes mellitus with moderate nonproliferative diabetic retinopathy without macular edema, right eye: Secondary | ICD-10-CM | POA: Diagnosis not present

## 2021-10-07 DIAGNOSIS — E11311 Type 2 diabetes mellitus with unspecified diabetic retinopathy with macular edema: Secondary | ICD-10-CM | POA: Insufficient documentation

## 2021-10-07 DIAGNOSIS — E113312 Type 2 diabetes mellitus with moderate nonproliferative diabetic retinopathy with macular edema, left eye: Secondary | ICD-10-CM | POA: Diagnosis not present

## 2021-10-07 DIAGNOSIS — H35372 Puckering of macula, left eye: Secondary | ICD-10-CM | POA: Diagnosis not present

## 2021-10-07 DIAGNOSIS — E113393 Type 2 diabetes mellitus with moderate nonproliferative diabetic retinopathy without macular edema, bilateral: Secondary | ICD-10-CM | POA: Insufficient documentation

## 2021-10-07 HISTORY — DX: Type 2 diabetes mellitus with unspecified diabetic retinopathy with macular edema: E11.311

## 2021-10-07 MED ORDER — BEVACIZUMAB CHEMO INJECTION 1.25MG/0.05ML SYRINGE FOR KALEIDOSCOPE
1.2500 mg | INTRAVITREAL | Status: AC | PRN
  Administered 2021-10-07: 1.25 mg via INTRAVITREAL

## 2021-10-07 NOTE — Progress Notes (Signed)
10/07/2021     CHIEF COMPLAINT Patient presents for  Chief Complaint  Patient presents with   Diabetic Retinopathy with Macular Edema      HISTORY OF PRESENT ILLNESS: Jacqueline Bell is a 71 y.o. female who presents to the clinic today for:   HPI   Pt states her vision has been stable  Pt admits to new floater in left eye  Pt states her new floater started seeing it after a laser treatment Pt denies any new FOL   Last edited by Aleene Davidson, CMA on 10/07/2021  2:52 PM.      Referring physician: Ernesto Rutherford, MD [address] [place],  [place] [address]  HISTORICAL INFORMATION:   Selected notes from the MEDICAL RECORD NUMBER    Lab Results  Component Value Date   HGBA1C 7.8 (H) 03/18/2021     CURRENT MEDICATIONS: Current Outpatient Medications (Ophthalmic Drugs)  Medication Sig   Olopatadine HCl (PAZEO) 0.7 % SOLN Place 1 drop into both eyes daily.   No current facility-administered medications for this visit. (Ophthalmic Drugs)   Current Outpatient Medications (Other)  Medication Sig   atorvastatin (LIPITOR) 10 MG tablet TAKE 1 TABLET DAILY   benzonatate (TESSALON PERLES) 100 MG capsule Take 1 capsule (100 mg total) by mouth every 6 (six) hours as needed.   cholecalciferol (VITAMIN D) 1000 units tablet Take 1,000 Units by mouth daily.   dapagliflozin propanediol (FARXIGA) 10 MG TABS tablet Take 1 tablet (10 mg total) by mouth daily before breakfast.   EPINEPHrine 0.3 mg/0.3 mL IJ SOAJ injection    fluticasone (FLONASE) 50 MCG/ACT nasal spray Place 1 spray into both nostrils daily.   hydrocortisone cream 1 % Apply to affected area 2 times daily   insulin aspart (NOVOLOG FLEXPEN) 100 UNIT/ML FlexPen Per SS: < 70 = 0; 70-90 = 4 units; 91-130 = 6 units; 131-150 = 7 units; 151-200 = 8 units; 201-250 = 9 units; 251-300 = 10 units; 301-350 = 11 units; 351-400 = 12 units; 401-450 = 13 units; > 450 = 14 units.   loratadine (CLARITIN) 10 MG tablet TAKE 1 TABLET  DAILY   magnesium oxide (MAG-OX) 400 MG tablet Take 400 mg by mouth daily.   meloxicam (MOBIC) 15 MG tablet TAKE 1 TABLET AS NEEDED   Multiple Vitamin (MULTIVITAMIN) tablet Take 1 tablet by mouth daily.   Omega-3 Fatty Acids (FISH OIL PO) Take 2 capsules by mouth daily.    Semaglutide (RYBELSUS) 14 MG TABS Take 1 tablet (14 mg total) by mouth daily.   telmisartan (MICARDIS) 20 MG tablet Take 1 tablet (20 mg total) by mouth daily.   TRESIBA FLEXTOUCH 100 UNIT/ML FlexTouch Pen INJECT 20 UNITS UNDER THE SKIN DAILY (Patient taking differently: Patient is taking 12-20 units under the skin daily)   No current facility-administered medications for this visit. (Other)      REVIEW OF SYSTEMS: ROS   Negative for: Constitutional, Gastrointestinal, Neurological, Skin, Genitourinary, Musculoskeletal, HENT, Endocrine, Cardiovascular, Eyes, Respiratory, Psychiatric, Allergic/Imm, Heme/Lymph Last edited by Erling Cruz D, CMA on 10/07/2021  2:52 PM.       ALLERGIES Allergies  Allergen Reactions   Iodine Hives   Shellfish Allergy Hives   Tramadol Rash    PAST MEDICAL HISTORY Past Medical History:  Diagnosis Date   Diabetes mellitus without complication (HCC)    Mixed hyperlipidemia 12/04/2020   History reviewed. No pertinent surgical history.  FAMILY HISTORY History reviewed. No pertinent family history.  SOCIAL HISTORY Social History   Tobacco Use   Smoking status: Never   Smokeless tobacco: Never  Vaping Use   Vaping Use: Never used  Substance Use Topics   Alcohol use: No   Drug use: No         OPHTHALMIC EXAM:  Base Eye Exam     Visual Acuity (ETDRS)       Right Left   Dist [place] 20/20 20/20         Tonometry (Tonopen, 2:57 PM)       Right Left   Pressure 14 13         Pupils       Pupils APD   Right PERRL None   Left PERRL None         Visual Fields       Left Right    Full Full         Extraocular Movement       Right Left    Full,  Ortho Full, Ortho         Neuro/Psych     Oriented x3: Yes   Mood/Affect: Normal         Dilation     Both eyes: 1.0% Mydriacyl, 2.5% Phenylephrine @ 2:53 PM           Slit Lamp and Fundus Exam     External Exam       Right Left   External Normal Normal         Slit Lamp Exam       Right Left   Lids/Lashes Normal Normal   Conjunctiva/Sclera White and quiet White and quiet   Cornea Clear Clear   Anterior Chamber Deep and quiet Deep and quiet   Iris Round and reactive Round and reactive   Lens Centered posterior chamber intraocular lens Centered posterior chamber intraocular lens   Anterior Vitreous Normal Normal         Fundus Exam       Right Left   Posterior Vitreous Normal Normal   Disc Normal Normal   C/D Ratio 0.45 0.5   Macula Microaneurysms Epiretinal membrane, Moderate clinically significant macular edema, Exudates   Vessels NPDR- Moderate NPDR- Moderate   Periphery Normal Normal            IMAGING AND PROCEDURES  Imaging and Procedures for 10/07/21  OCT, Retina - OU - Both Eyes       Right Eye Quality was good. Scan locations included subfoveal. Central Foveal Thickness: 237. Progression has been stable. Findings include normal foveal contour.   Left Eye Quality was good. Scan locations included subfoveal. Central Foveal Thickness: 472. Progression has worsened. Findings include abnormal foveal contour, epiretinal membrane.   Notes OS with center involved CSME.  Much worse than last visit here 5 years previous.  Some role of the epiretinal membrane present however.  We will treat with antivegF now and perhaps repeat again in 5 weeks and if epiretinal membrane is significant not likely CSME will improve.       Color Fundus Photography Optos - OU - Both Eyes       Right Eye Progression has been stable. Disc findings include normal observations. Macula : microaneurysms.   Left Eye Progression has worsened. Macula :  exudates. Periphery : normal observations.   Notes OD moderate NPDR  OS moderate NPDR with macular exudates posteriorly with moderate CSME     Intravitreal Injection, Pharmacologic Agent - OS - Left  Eye       Time Out 10/07/2021. 3:56 PM. Confirmed correct patient, procedure, site, and patient consented.   Anesthesia Topical anesthesia was used. Anesthetic medications included Lidocaine 4%.   Procedure Preparation included 5% betadine to ocular surface, 10% betadine to eyelids. A 30 gauge needle was used.   Injection: 1.25 mg Bevacizumab 1.25mg /0.40ml   Route: Intravitreal, Site: Left Eye   NDC: [id number]   Post-op Post injection exam found visual acuity of at least counting fingers. The patient tolerated the procedure well. There were no complications. The patient received written and verbal post procedure care education. Post injection medications included gentamicin.              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 Epiretinal membrane, left eye  H35.372 OCT, Retina - OU - Both Eyes    Color Fundus Photography Optos - OU - Both Eyes    3. Moderate nonproliferative diabetic retinopathy of right eye without macular edema associated with type 2 diabetes mellitus (HCC)  E11.3391 OCT, Retina - OU - Both Eyes    Color Fundus Photography  Optos - OU - Both Eyes      1.  OS with center involved CSME much worse over the last 5 years.  Good acuity by Snellen acuity however patient does note there is poor reading vision in the left eye.  Some component of epiretinal membrane impact as well.  2.  Risk benefits of use of Avastin reviewed with the patient and she has agreed to proceed with Avastin treatment OS today reevaluate in 5 weeks.  If persistence of thickening occurs may need vitrectomy membrane peel to remove the epiretinal membrane and alleviate the structural damage ongoing from that condition  3.  Ophthalmic Meds Ordered this visit:  Meds ordered this encounter  Medications   Bevacizumab (AVASTIN) SOLN 1.25 mg       Return in about 5 weeks (around 11/11/2021) for dilate, OS, AVASTIN OCT.  There are no Patient Instructions on file for this visit.   Explained the diagnoses, plan, and follow up with the patient and they expressed understanding.  Patient expressed understanding of the importance of proper follow up care.   Clent Demark Valynn Schamberger M.D. Diseases & Surgery of the Retina and Vitreous Retina & Diabetic [employer] 10/07/21     Abbreviations: M myopia (nearsighted); A astigmatism; H hyperopia (farsighted); P presbyopia; Mrx spectacle prescription;  CTL contact lenses; OD right eye; OS left eye; OU both eyes  XT exotropia; ET esotropia; PEK punctate epithelial keratitis; PEE punctate epithelial erosions; DES dry eye syndrome; MGD meibomian gland dysfunction; ATs artificial tears; PFAT's preservative free artificial tears; Pollard nuclear sclerotic cataract; PSC posterior subcapsular cataract; ERM epi-retinal membrane; PVD posterior vitreous detachment; RD retinal detachment; DM diabetes mellitus; DR diabetic retinopathy; NPDR non-proliferative diabetic retinopathy; PDR proliferative diabetic retinopathy; CSME clinically significant  macular edema; DME diabetic macular edema; dbh dot blot hemorrhages; CWS cotton wool spot;  POAG primary open angle glaucoma; C/D cup-to-disc ratio; HVF humphrey visual field; GVF goldmann visual field; OCT optical coherence tomography; IOP intraocular pressure; BRVO Branch retinal vein occlusion; CRVO central retinal vein occlusion; CRAO central retinal artery occlusion; BRAO branch retinal artery occlusion; RT retinal tear; SB scleral buckle; PPV pars plana vitrectomy; VH Vitreous hemorrhage; PRP panretinal laser photocoagulation; IVK intravitreal kenalog; VMT vitreomacular traction; MH Macular hole;  NVD neovascularization of the disc; NVE neovascularization elsewhere; AREDS age related eye disease study; ARMD age related macular degeneration; POAG primary open angle glaucoma; EBMD epithelial/anterior basement membrane dystrophy; ACIOL anterior chamber intraocular lens; IOL intraocular lens; PCIOL posterior chamber intraocular lens; Phaco/IOL phacoemulsification with intraocular lens placement; PRK photorefractive keratectomy; LASIK laser assisted in situ keratomileusis; HTN hypertension; DM diabetes mellitus; COPD chronic obstructive pulmonary disease

## 2021-10-07 NOTE — Assessment & Plan Note (Signed)
Will observe OD for now no active maculopathy

## 2021-10-07 NOTE — Assessment & Plan Note (Signed)
Center involved CSME worsening in conjunction also with epiretinal membrane

## 2021-10-22 ENCOUNTER — Ambulatory Visit: Payer: Medicare Other | Admitting: Nurse Practitioner

## 2021-10-22 ENCOUNTER — Ambulatory Visit (INDEPENDENT_AMBULATORY_CARE_PROVIDER_SITE_OTHER): Payer: Medicare Other

## 2021-10-22 VITALS — Ht 67.5 in | Wt 167.0 lb

## 2021-10-22 DIAGNOSIS — Z0000 Encounter for general adult medical examination without abnormal findings: Secondary | ICD-10-CM

## 2021-10-22 NOTE — Patient Instructions (Signed)
Ms. Koman , Thank you for taking time to come for your Medicare Wellness Visit. I appreciate your ongoing commitment to your health goals. Please review the following plan we discussed and let me know if I can assist you in the future.   Screening recommendations/referrals: Colonoscopy: completed 09/19/2018, due 09/18/2028 Mammogram: completed 07/17/2021, due 07/19/2022 Bone Density: completed 10/26/2012 Recommended yearly ophthalmology/optometry visit for glaucoma screening and checkup Recommended yearly dental visit for hygiene and checkup  Vaccinations: Influenza vaccine: due Pneumococcal vaccine: completed 08/28/2020 Tdap vaccine: completed 10/26/2012, due 10/27/2022 Shingles vaccine: discussed   Covid-19: 01/24/2021, 01/17/2020, 05/01/2019, 04/07/2019  Advanced directives: Please bring a copy of your POA (Power of Attorney) and/or Living Will to your next appointment.   Conditions/risks identified: none  Next appointment: Follow up in one year for your annual wellness visit    Preventive Care 65 Years and Older, Female Preventive care refers to lifestyle choices and visits with your health care provider that can promote health and wellness. What does preventive care include? A yearly physical exam. This is also called an annual well check. Dental exams once or twice a year. Routine eye exams. Ask your health care provider how often you should have your eyes checked. Personal lifestyle choices, including: Daily care of your teeth and gums. Regular physical activity. Eating a healthy diet. Avoiding tobacco and drug use. Limiting alcohol use. Practicing safe sex. Taking low-dose aspirin every day. Taking vitamin and mineral supplements as recommended by your health care provider. What happens during an annual well check? The services and screenings done by your health care provider during your annual well check will depend on your age, overall health, lifestyle risk factors, and  family history of disease. Counseling  Your health care provider may ask you questions about your: Alcohol use. Tobacco use. Drug use. Emotional well-being. Home and relationship well-being. Sexual activity. Eating habits. History of falls. Memory and ability to understand (cognition). Work and work Astronomer. Reproductive health. Screening  You may have the following tests or measurements: Height, weight, and BMI. Blood pressure. Lipid and cholesterol levels. These may be checked every 5 years, or more frequently if you are over 27 years old. Skin check. Lung cancer screening. You may have this screening every year starting at age 53 if you have a 30-pack-year history of smoking and currently smoke or have quit within the past 15 years. Fecal occult blood test (FOBT) of the stool. You may have this test every year starting at age 74. Flexible sigmoidoscopy or colonoscopy. You may have a sigmoidoscopy every 5 years or a colonoscopy every 10 years starting at age 20. Hepatitis C blood test. Hepatitis B blood test. Sexually transmitted disease (STD) testing. Diabetes screening. This is done by checking your blood sugar (glucose) after you have not eaten for a while (fasting). You may have this done every 1-3 years. Bone density scan. This is done to screen for osteoporosis. You may have this done starting at age 20. Mammogram. This may be done every 1-2 years. Talk to your health care provider about how often you should have regular mammograms. Talk with your health care provider about your test results, treatment options, and if necessary, the need for more tests. Vaccines  Your health care provider may recommend certain vaccines, such as: Influenza vaccine. This is recommended every year. Tetanus, diphtheria, and acellular pertussis (Tdap, Td) vaccine. You may need a Td booster every 10 years. Zoster vaccine. You may need this after age 33. Pneumococcal 13-valent conjugate  (  PCV13) vaccine. One dose is recommended after age 63. Pneumococcal polysaccharide (PPSV23) vaccine. One dose is recommended after age 97. Talk to your health care provider about which screenings and vaccines you need and how often you need them. This information is not intended to replace advice given to you by your health care provider. Make sure you discuss any questions you have with your health care provider. Document Released: 03/22/2015 Document Revised: 11/13/2015 Document Reviewed: 12/25/2014 Elsevier Interactive Patient Education  2017 [place] Prevention in the Home Falls can cause injuries. They can happen to people of all ages. There are many things you can do to make your home safe and to help prevent falls. What can I do on the outside of my home? Regularly fix the edges of walkways and driveways and fix any cracks. Remove anything that might make you trip as you walk through a door, such as a raised step or threshold. Trim any bushes or trees on the path to your home. Use bright outdoor lighting. Clear any walking paths of anything that might make someone trip, such as rocks or tools. Regularly check to see if handrails are loose or broken. Make sure that both sides of any steps have handrails. Any raised decks and porches should have guardrails on the edges. Have any leaves, snow, or ice cleared regularly. Use sand or salt on walking paths during winter. Clean up any spills in your garage right away. This includes oil or grease spills. What can I do in the bathroom? Use night lights. Install grab bars by the toilet and in the tub and shower. Do not use towel bars as grab bars. Use non-skid mats or decals in the tub or shower. If you need to sit down in the shower, use a plastic, non-slip stool. Keep the floor dry. Clean up any water that spills on the floor as soon as it happens. Remove soap buildup in the tub or shower regularly. Attach bath mats securely with  double-sided non-slip rug tape. Do not have throw rugs and other things on the floor that can make you trip. What can I do in the bedroom? Use night lights. Make sure that you have a light by your bed that is easy to reach. Do not use any sheets or blankets that are too big for your bed. They should not hang down onto the floor. Have a firm chair that has side arms. You can use this for support while you get dressed. Do not have throw rugs and other things on the floor that can make you trip. What can I do in the kitchen? Clean up any spills right away. Avoid walking on wet floors. Keep items that you use a lot in easy-to-reach places. If you need to reach something above you, use a strong step stool that has a grab bar. Keep electrical cords out of the way. Do not use floor polish or wax that makes floors slippery. If you must use wax, use non-skid floor wax. Do not have throw rugs and other things on the floor that can make you trip. What can I do with my stairs? Do not leave any items on the stairs. Make sure that there are handrails on both sides of the stairs and use them. Fix handrails that are broken or loose. Make sure that handrails are as long as the stairways. Check any carpeting to make sure that it is firmly attached to the stairs. Fix any carpet that is loose  or worn. Avoid having throw rugs at the top or bottom of the stairs. If you do have throw rugs, attach them to the floor with carpet tape. Make sure that you have a light switch at the top of the stairs and the bottom of the stairs. If you do not have them, ask someone to add them for you. What else can I do to help prevent falls? Wear shoes that: Do not have high heels. Have rubber bottoms. Are comfortable and fit you well. Are closed at the toe. Do not wear sandals. If you use a stepladder: Make sure that it is fully opened. Do not climb a closed stepladder. Make sure that both sides of the stepladder are locked  into place. Ask someone to hold it for you, if possible. Clearly mark and make sure that you can see: Any grab bars or handrails. First and last steps. Where the edge of each step is. Use tools that help you move around (mobility aids) if they are needed. These include: Canes. Walkers. Scooters. Crutches. Turn on the lights when you go into a dark area. Replace any light bulbs as soon as they burn out. Set up your furniture so you have a clear path. Avoid moving your furniture around. If any of your floors are uneven, fix them. If there are any pets around you, be aware of where they are. Review your medicines with your doctor. Some medicines can make you feel dizzy. This can increase your chance of falling. Ask your doctor what other things that you can do to help prevent falls. This information is not intended to replace advice given to you by your health care provider. Make sure you discuss any questions you have with your health care provider. Document Released: 12/20/2008 Document Revised: 08/01/2015 Document Reviewed: 03/30/2014 Elsevier Interactive Patient Education  2017 [employer].

## 2021-10-22 NOTE — Progress Notes (Signed)
I connected with Lenord Fellers today by telephone and verified that I am speaking with the correct person using two identifiers. Location patient: home Location provider: work Persons participating in the virtual visit: [name], [name] LPN.   I discussed the limitations, risks, security and privacy concerns of performing an evaluation and management service by telephone and the availability of in person appointments. I also discussed with the patient that there may be a patient responsible charge related to this service. The patient expressed understanding and verbally consented to this telephonic visit.    Interactive audio and video telecommunications were attempted between this provider and patient, however failed, due to patient having technical difficulties OR patient did not have access to video capability.  We continued and completed visit with audio only.     Vital signs may be patient reported or missing.  Subjective:   Jacqueline Bell is a 71 y.o. female who presents for Medicare Annual (Subsequent) preventive examination.  Review of Systems     Cardiac Risk Factors include: advanced age (>42men, >51 women);diabetes mellitus;dyslipidemia     Objective:    Today's Vitals   10/22/21 1035  Weight: 167 lb (75.8 kg)  Height: 5' 7.5" (1.715 m)   Body mass index is 25.77 kg/m.     10/22/2021   10:41 AM 09/25/2020   10:50 AM 05/17/2019   11:14 AM 05/05/2018   11:50 AM 10/05/2016   10:27 AM  Advanced Directives  Does Patient Have a Medical Advance Directive? Yes Yes Yes No No  Type of Estate agent of [place];Living will Healthcare Power of [place];Living will Living will    Copy of Healthcare Power of Attorney in Chart? No - copy requested No - copy requested     Would patient like information on creating a medical advance directive?    Yes (MAU/Ambulatory/Procedural Areas - Information given)     Current Medications  (verified) Outpatient Encounter Medications as of 10/22/2021  Medication Sig   atorvastatin (LIPITOR) 10 MG tablet TAKE 1 TABLET DAILY   benzonatate (TESSALON PERLES) 100 MG capsule Take 1 capsule (100 mg total) by mouth every 6 (six) hours as needed.   cholecalciferol (VITAMIN D) 1000 units tablet Take 1,000 Units by mouth daily.   dapagliflozin propanediol (FARXIGA) 10 MG TABS tablet Take 1 tablet (10 mg total) by mouth daily before breakfast.   EPINEPHrine 0.3 mg/0.3 mL IJ SOAJ injection    fluticasone (FLONASE) 50 MCG/ACT nasal spray Place 1 spray into both nostrils daily.   hydrocortisone cream 1 % Apply to affected area 2 times daily   insulin aspart (NOVOLOG FLEXPEN) 100 UNIT/ML FlexPen Per SS: < 70 = 0; 70-90 = 4 units; 91-130 = 6 units; 131-150 = 7 units; 151-200 = 8 units; 201-250 = 9 units; 251-300 = 10 units; 301-350 = 11 units; 351-400 = 12 units; 401-450 = 13 units; > 450 = 14 units.   loratadine (CLARITIN) 10 MG tablet TAKE 1 TABLET DAILY   magnesium oxide (MAG-OX) 400 MG tablet Take 400 mg by mouth daily.   meloxicam (MOBIC) 15 MG tablet TAKE 1 TABLET AS NEEDED   Multiple Vitamin (MULTIVITAMIN) tablet Take 1 tablet by mouth daily.   Olopatadine HCl (PAZEO) 0.7 % SOLN Place 1 drop into both eyes daily.   Omega-3 Fatty Acids (FISH OIL PO) Take 2 capsules by mouth daily.    Semaglutide (RYBELSUS) 14 MG TABS Take 1 tablet (14 mg total) by mouth daily.   telmisartan (MICARDIS)  20 MG tablet Take 1 tablet (20 mg total) by mouth daily.   TRESIBA FLEXTOUCH 100 UNIT/ML FlexTouch Pen INJECT 20 UNITS UNDER THE SKIN DAILY (Patient taking differently: Patient is taking 12-20 units under the skin daily)   No facility-administered encounter medications on file as of 10/22/2021.    Allergies (verified) Iodine, Shellfish allergy, and Tramadol   History: Past Medical History:  Diagnosis Date   Diabetes mellitus without complication (HCC)    Mixed hyperlipidemia 12/04/2020   History  reviewed. No pertinent surgical history. History reviewed. No pertinent family history. Social History   Socioeconomic History   Marital status: Married    Spouse name: Not on file   Number of children: 4   Years of education: Not on file   Highest education level: Associate degree: academic program  Occupational History   Not on file  Tobacco Use   Smoking status: Never   Smokeless tobacco: Never  Vaping Use   Vaping Use: Never used  Substance and Sexual Activity   Alcohol use: No   Drug use: No   Sexual activity: Yes  Other Topics Concern   Not on file  Social History Narrative   Patient reports no social determinants of health during today's screening. 3/12   Social Determinants of Health   Financial Resource Strain: Low Risk  (10/22/2021)   Overall Financial Resource Strain (CARDIA)    Difficulty of Paying Living Expenses: Not hard at all  Food Insecurity: No Food Insecurity (10/22/2021)   Hunger Vital Sign    Worried About Running Out of Food in the Last Year: Never true    Ran Out of Food in the Last Year: Never true  Transportation Needs: No Transportation Needs (10/22/2021)   PRAPARE - Administrator, Civil Service (Medical): No    Lack of Transportation (Non-Medical): No  Physical Activity: Insufficiently Active (10/22/2021)   Exercise Vital Sign    Days of Exercise per Week: 3 days    Minutes of Exercise per Session: 30 min  Stress: No Stress Concern Present (10/22/2021)   [employer] of Occupational Health - Occupational Stress Questionnaire    Feeling of Stress : Not at all  Social Connections: Socially Integrated (05/19/2018)   Social Connection and Isolation Panel [NHANES]    Frequency of Communication with Friends and Family: More than three times a week    Frequency of Social Gatherings with Friends and Family: More than three times a week    Attends Religious Services: More than 4 times per year    Active Member of [employer] or Organizations:  Yes    Attends Engineer, structural: More than 4 times per year    Marital Status: Married    Tobacco Counseling Counseling given: Not Answered   Clinical Intake:  Pre-visit preparation completed: Yes  Pain : No/denies pain     Nutritional Status: BMI 25 -29 Overweight Nutritional Risks: None Diabetes: Yes  How often do you need to have someone help you when you read instructions, pamphlets, or other written materials from your doctor or pharmacy?: 1 - Never What is the last grade level you completed in school?: 34yrs college  Diabetic? Yes Nutrition Risk Assessment:  Has the patient had any N/V/D within the last 2 months?  No  Does the patient have any non-healing wounds?  No  Has the patient had any unintentional weight loss or weight gain?  No   Diabetes:  Is the patient diabetic?  Yes  If  diabetic, was a CBG obtained today?  No  Did the patient bring in their glucometer from home?  No  How often do you monitor your CBG's? 5-6 daily.   Financial Strains and Diabetes Management:  Are you having any financial strains with the device, your supplies or your medication? No .  Does the patient want to be seen by Chronic Care Management for management of their diabetes?  No  Would the patient like to be referred to a Nutritionist or for Diabetic Management?  No   Diabetic Exams:  Diabetic Eye Exam: Overdue for diabetic eye exam. Pt has been advised about the importance in completing this exam. Patient advised to call and schedule an eye exam. Diabetic Foot Exam: Completed 03/18/2021   Interpreter Needed?: No  Information entered by :: NAllen LPN   Activities of Daily Living    10/22/2021   10:42 AM  In your present state of health, do you have any difficulty performing the following activities:  Hearing? 0  Vision? 0  Difficulty concentrating or making decisions? 0  Walking or climbing stairs? 0  Dressing or bathing? 0  Doing errands, shopping? 0   Preparing Food and eating ? N  Using the Toilet? N  In the past six months, have you accidently leaked urine? Y  Do you have problems with loss of bowel control? N  Managing your Medications? N  Managing your Finances? N  Housekeeping or managing your Housekeeping? N    Patient Care Team: Arnette Felts, FNP as PCP - General (General Practice) Sallye Lat, MD as Consulting Physician (Ophthalmology) Clarene Duke, Karma Lew, RN as Case Manager  Indicate any recent Medical Services you may have received from other than [facility] providers in the past year (date may be approximate).     Assessment:   This is a routine wellness examination for [place].  Hearing/Vision screen Vision Screening - Comments:: Regular eye exams, Dr. Ernesto Rutherford, Dr. Luciana Axe  Dietary issues and exercise activities discussed: Current Exercise Habits: Home exercise routine, Type of exercise: walking, Time (Minutes): 30, Frequency (Times/Week): 3, Weekly Exercise (Minutes/Week): 90   Goals Addressed             This Visit's Progress    Patient Stated       10/22/2021, trying to get in to a water aerobics class       Depression Screen    10/22/2021   10:42 AM 09/25/2020   10:50 AM 05/23/2020   11:44 AM 05/17/2019   11:15 AM 01/23/2019   11:46 AM 11/21/2018   11:56 AM 09/19/2018   12:03 PM  PHQ 2/9 Scores  PHQ - 2 Score 0 0 0 0 0 0 0  PHQ- 9 Score    3       Fall Risk    10/22/2021   10:42 AM 09/25/2020   10:50 AM 05/23/2020   11:44 AM 05/17/2019   11:15 AM 01/23/2019   11:46 AM  Fall Risk   Falls in the past year? 0 0 0 0 0  Number falls in past yr: 0      Injury with Fall? 0      Risk for fall due to : Medication side effect Medication side effect  Medication side effect   Follow up Falls evaluation completed;Education provided;Falls prevention discussed Falls evaluation completed;Education provided;Falls prevention discussed  Falls evaluation completed;Education provided;Falls prevention  discussed     FALL RISK PREVENTION PERTAINING TO THE HOME:  Any stairs in or around  the home? Yes  If so, are there any without handrails? No  Home free of loose throw rugs in walkways, pet beds, electrical cords, etc? Yes  Adequate lighting in your home to reduce risk of falls? Yes   ASSISTIVE DEVICES UTILIZED TO PREVENT FALLS:  Life alert? No  Use of a cane, walker or w/c? No  Grab bars in the bathroom? No  Shower chair or bench in shower? No  Elevated toilet seat or a handicapped toilet? No   TIMED UP AND GO:  Was the test performed? No .      Cognitive Function:        10/22/2021   10:43 AM 09/25/2020   10:51 AM 05/17/2019   11:19 AM 05/05/2018   11:54 AM  6CIT Screen  What Year? 0 points 0 points 0 points 0 points  What month? 0 points 0 points 0 points 0 points  What time? 0 points 0 points 0 points 0 points  Count back from 20 0 points 0 points 0 points 0 points  Months in reverse 2 points 0 points 0 points 0 points  Repeat phrase 4 points 4 points 2 points 0 points  Total Score 6 points 4 points 2 points 0 points    Immunizations Immunization History  Administered Date(s) Administered   Fluad Quad(high Dose 65+) 11/23/2019   IPV 08/28/1996   Influenza, High Dose Seasonal PF 11/28/2018   Influenza-Unspecified 11/18/2017   PFIZER(Purple Top)SARS-COV-2 Vaccination 04/07/2019, 05/01/2019, 01/17/2020   PNEUMOCOCCAL CONJUGATE-20 08/28/2020   Pfizer Covid-19 Vaccine Bivalent Booster 57yrs & up 01/24/2021    TDAP status: Up to date  Flu Vaccine status: Due, Education has been provided regarding the importance of this vaccine. Advised may receive this vaccine at local pharmacy or Health Dept. Aware to provide a copy of the vaccination record if obtained from local pharmacy or Health Dept. Verbalized acceptance and understanding.  Pneumococcal vaccine status: Up to date  Covid-19 vaccine status: Completed vaccines  Qualifies for Shingles Vaccine? Yes    Zostavax completed No   Shingrix Completed?: No.    Education has been provided regarding the importance of this vaccine. Patient has been advised to call insurance company to determine out of pocket expense if they have not yet received this vaccine. Advised may also receive vaccine at local pharmacy or Health Dept. Verbalized acceptance and understanding.  Screening Tests Health Maintenance  Topic Date Due   Zoster Vaccines- Shingrix (1 of 2) Never done   OPHTHALMOLOGY EXAM  06/10/2018   COVID-19 Vaccine (5 - Pfizer risk series) 03/21/2021   HEMOGLOBIN A1C  09/15/2021   INFLUENZA VACCINE  10/07/2021   FOOT EXAM  03/18/2022   MAMMOGRAM  07/18/2022   TETANUS/TDAP  10/27/2022   COLONOSCOPY (Pts 45-51yrs Insurance coverage will need to be confirmed)  09/18/2028   Pneumonia Vaccine 80+ Years old  Completed   DEXA SCAN  Completed   Hepatitis C Screening  Completed   HPV VACCINES  Aged Out    Health Maintenance  Health Maintenance Due  Topic Date Due   Zoster Vaccines- Shingrix (1 of 2) Never done   OPHTHALMOLOGY EXAM  06/10/2018   COVID-19 Vaccine (5 - Pfizer risk series) 03/21/2021   HEMOGLOBIN A1C  09/15/2021   INFLUENZA VACCINE  10/07/2021    Colorectal cancer screening: Type of screening: Colonoscopy. Completed 09/19/2018. Repeat every 10 years  Mammogram status: Completed 07/17/2021. Repeat every year  Bone Density status: Completed 10/26/2012  Lung Cancer Screening: (Low Dose CT  Chest recommended if Age 72-80 years, 30 pack-year currently smoking OR have quit w/in 15years.) does not qualify.   Lung Cancer Screening Referral: no  Additional Screening:  Hepatitis C Screening: does qualify; Completed 03/28/2012  Vision Screening: Recommended annual ophthalmology exams for early detection of glaucoma and other disorders of the eye. Is the patient up to date with their annual eye exam?  Yes  Who is the provider or what is the name of the office in which the patient attends  annual eye exams? Dr. Dione BoozeGroat If pt is not established with a provider, would they like to be referred to a provider to establish care? No .   Dental Screening: Recommended annual dental exams for proper oral hygiene  Community Resource Referral / Chronic Care Management: CRR required this visit?  No   CCM required this visit?  No      Plan:     I have personally reviewed and noted the following in the patient's chart:   Medical and social history Use of alcohol, tobacco or illicit drugs  Current medications and supplements including opioid prescriptions.  Functional ability and status Nutritional status Physical activity Advanced directives List of other physicians Hospitalizations, surgeries, and ER visits in previous 12 months Vitals Screenings to include cognitive, depression, and falls Referrals and appointments  In addition, I have reviewed and discussed with patient certain preventive protocols, quality metrics, and best practice recommendations. A written personalized care plan for preventive services as well as general preventive health recommendations were provided to patient.     Barb Merinoickeah E Zina Pitzer, LPN   [id number]/2023   Nurse Notes: none  Due to this being a virtual visit, the after visit summary with patients personalized plan was offered to patient via mail or my-chart.  Patient would like to access on my-chart

## 2021-10-23 NOTE — Progress Notes (Signed)
This encounter was created in error - please disregard.

## 2021-10-24 ENCOUNTER — Ambulatory Visit: Payer: Self-pay

## 2021-10-24 ENCOUNTER — Other Ambulatory Visit: Payer: Self-pay | Admitting: Nurse Practitioner

## 2021-10-24 DIAGNOSIS — E119 Type 2 diabetes mellitus without complications: Secondary | ICD-10-CM

## 2021-10-24 DIAGNOSIS — I10 Essential (primary) hypertension: Secondary | ICD-10-CM

## 2021-10-24 NOTE — Patient Outreach (Signed)
  Care Coordination   Follow Up Visit Note   10/24/2021 Name: Jacqueline Bell MRN: [id number] DOB: [birth year]  Jacqueline Bell is a 71 y.o. year old female who sees Jacqueline Felts, FNP for primary care. I spoke with  Jacqueline Bell by phone today  What matters to the patients health and wellness today?  Patient would like to exercise more and is interested in the PREP program.     Goals Addressed       Patient Stated     I would like to exercise more (pt-stated)        Care Coordination Interventions: Evaluation of current treatment plan related to hypertension self management and patient's adherence to plan as established by provider Counseled on the importance of exercise goals with target of 150 minutes per week Advised patient, providing education and rationale, to monitor blood pressure daily and record, calling PCP for findings outside established parameters Provided education on prescribed diet low Sodium Assessed social determinant of health barriers Educated patient on goal BP <130/80 Instructed patient to monitor her BP at home 3 times weekly and record readings Educated patient regarding the PREP program, sent in basket message to provider requesting PREP referral     SDOH assessments and interventions completed:  Yes     Care Coordination Interventions Activated:  Yes  Care Coordination Interventions:  Yes, provided   Follow up plan: telephone follow up scheduled for 11/13/21 @1 :00 PM    Encounter Outcome:  Pt. Visit Completed

## 2021-10-24 NOTE — Patient Instructions (Signed)
Visit Information  Thank you for taking time to visit with me today. Please don't hesitate to contact me if I can be of assistance to you.   Following are the goals we discussed today:   Goals Addressed       Patient Stated     I would like to exercise more (pt-stated)        Care Coordination Interventions: Evaluation of current treatment plan related to hypertension self management and patient's adherence to plan as established by provider Counseled on the importance of exercise goals with target of 150 minutes per week Advised patient, providing education and rationale, to monitor blood pressure daily and record, calling PCP for findings outside established parameters Provided education on prescribed diet low Sodium Assessed social determinant of health barriers Educated patient on goal BP <130/80 Instructed patient to monitor her BP at home 3 times weekly and record readings Educated patient regarding the PREP program, sent in basket message to provider requesting PREP referral      Our next appointment is by telephone on 11/13/21 at 01:00 PM   Please call the care guide team at [phone] if you need to cancel or reschedule your appointment.   If you are experiencing a Mental Health or Behavioral Health Crisis or need someone to talk to, please call 1-800-273-TALK (toll free, 24 hour hotline)  Patient verbalizes understanding of instructions and care plan provided today and agrees to view in MyChart. Active MyChart status and patient understanding of how to access instructions and care plan via MyChart confirmed with patient.     Delsa Sale, RN, BSN, CCM Care Management Coordinator [facility] Direct Phone: [phone]

## 2021-10-30 ENCOUNTER — Telehealth: Payer: Self-pay

## 2021-10-30 NOTE — Telephone Encounter (Signed)
Call to pt reference referral to PREP Left message requesting call back to discuss

## 2021-11-03 ENCOUNTER — Encounter: Payer: Self-pay | Admitting: Nurse Practitioner

## 2021-11-04 ENCOUNTER — Telehealth: Payer: Self-pay

## 2021-11-04 NOTE — Telephone Encounter (Signed)
Call from pt reference referral to PREP Explained program to pt Lives in [place] and works in [place] Could do PREP an am class in [place] or would need to do an evening class at [place].  Would need to leave work for class and return to work after. Will let RN [facility] know for [place] and will call her back when arranging the evening class

## 2021-11-11 ENCOUNTER — Encounter (INDEPENDENT_AMBULATORY_CARE_PROVIDER_SITE_OTHER): Payer: Medicare Other | Admitting: Ophthalmology

## 2021-11-13 ENCOUNTER — Ambulatory Visit: Payer: Self-pay

## 2021-11-13 NOTE — Patient Outreach (Signed)
  Care Coordination   11/13/2021 Name: Jacqueline Bell MRN: [id number] DOB: [birth year]   Care Coordination Outreach Attempts:  An unsuccessful telephone outreach was attempted for a scheduled appointment today.  Follow Up Plan:  Additional outreach attempts will be made to offer the patient care coordination information and services.   Encounter Outcome:  No Answer  Care Coordination Interventions Activated:  No   Care Coordination Interventions:  No, not indicated    Delsa Sale, RN, BSN, CCM Care Management Coordinator [facility] Care Management  Direct Phone: [phone]

## 2021-11-24 ENCOUNTER — Telehealth (INDEPENDENT_AMBULATORY_CARE_PROVIDER_SITE_OTHER): Payer: Medicare Other | Admitting: Nurse Practitioner

## 2021-11-24 ENCOUNTER — Encounter: Payer: Self-pay | Admitting: Nurse Practitioner

## 2021-11-24 VITALS — BP 110/68 | HR 100 | Temp 97.2°F

## 2021-11-24 DIAGNOSIS — E1169 Type 2 diabetes mellitus with other specified complication: Secondary | ICD-10-CM

## 2021-11-24 DIAGNOSIS — Z794 Long term (current) use of insulin: Secondary | ICD-10-CM

## 2021-11-24 DIAGNOSIS — E119 Type 2 diabetes mellitus without complications: Secondary | ICD-10-CM

## 2021-11-24 DIAGNOSIS — R0989 Other specified symptoms and signs involving the circulatory and respiratory systems: Secondary | ICD-10-CM | POA: Diagnosis not present

## 2021-11-24 DIAGNOSIS — R051 Acute cough: Secondary | ICD-10-CM | POA: Diagnosis not present

## 2021-11-24 DIAGNOSIS — U071 COVID-19: Secondary | ICD-10-CM | POA: Insufficient documentation

## 2021-11-24 HISTORY — DX: COVID-19: U07.1

## 2021-11-24 MED ORDER — BENZONATATE 100 MG PO CAPS: 100.0000 mg | ORAL_CAPSULE | Freq: Four times a day (QID) | ORAL | 1 refills | Status: AC | PRN

## 2021-11-24 MED ORDER — FLUTICASONE PROPIONATE 50 MCG/ACT NA SUSP
1.0000 | Freq: Every day | NASAL | 2 refills | Status: DC
Start: 2021-11-24 — End: 2022-05-27

## 2021-11-24 NOTE — Patient Instructions (Addendum)
Infection Prevention in the Home If you have an infection, may have been exposed to an infection, or are taking care of someone who has an infection, it is important to know how to keep the infection from spreading. Follow your health care provider's instructions and use these guidelines to help stop the spread of infection. How infections are spread In order for an infection to spread, the following must be present: A germ. This may be a virus, bacteria, fungus, or parasite. A place for the germ to live. This may be: On or in a person, animal, plant, or food. In soil or water. On surfaces, such as a door handle. A person or animal who can develop a disease if the germ enters the body (host). The host does not have resistance to the germ. A way for the germ to enter the host. This may occur by: Direct contact with an infected person or animal. This can happen through shaking hands or hugging. Some germs can also travel through the air and spread to others. This can happen when an infected person coughs or sneezes on or near other people. Indirect contact. This occurs when the germ enters the host through contact with an infected object. Examples include: Eating or drinking food or water that is contaminated with the germ. Touching a contaminated surface with your hands, and then touching your face, eyes, nose, or mouth. Supplies needed: Soap. Alcohol-based hand sanitizer. Standard cleaning products. Disinfectants, such as bleach. Reusable cleaning cloths, sponges, or paper towels. Disposable or reusable utility gloves. How to prevent infection from spreading There are several things that you can do to help prevent infection from spreading. Take these general actions Everyone should take the following actions to prevent the spread of infection: Wash your hands often with soap and water for at least 20 seconds. If soap and water are not available, use alcohol-based hand sanitizer. Avoid  touching your face, mouth, nose, or eyes. Cough or sneeze into a tissue, sleeve, or elbow instead of into your hand or into the air. If you cough or sneeze into a tissue, throw it away immediately and wash your hands.  Keep your bathroom clean Provide soap. Change towels and washcloths frequently. Change toothbrushes often and store them separately in a clean, dry place. Clean and disinfect all surfaces, including the toilet, floor, tub, shower, and sink. Do not share personal items, such as razors, toothbrushes, deodorant, combs, brushes, towels, and washcloths. Maintain hygiene in the kitchen  Wash your hands before and after preparing food and before you eat. Clean the inside of your refrigerator each week. Keep your refrigerator set at 40F (4C) or less, and set your freezer at 0F (-18C) or less. Keep work surfaces clean. Disinfect them regularly. Wash your dishes in hot, soapy water. Air-dry your dishes or use a dishwasher. Do not share dishes or eating utensils. Handle food safely Store food carefully. Refrigerate leftovers promptly in covered containers. Throw out stale or spoiled food. Thaw foods in the refrigerator or microwave, not at room temperature. Serve foods at the proper temperature. Do not eat raw meat. Make sure it is cooked to the appropriate temperature. Cook eggs until they are firm. Wash fruits and vegetables under running water. Use separate cutting boards, plates, and utensils for raw foods and cooked foods. Use a clean spoon each time you sample food while cooking. Do laundry the right way Wear gloves if laundry is visibly soiled. Do not shake soiled laundry. Doing that may send germs   into the air. Wash laundry in hot water. If you cannot wash the laundry right away, place it in a plastic bag and wash it as soon as possible. Be careful around animals and pets Wash your hands before and after touching animals. If you have a pet, ensure that your pet stays  clean. Do not let people with weak immune systems touch bird droppings, fish tank water, or a litter box. If you have a pet cage or litter box, be sure to clean it every day. If you are sick, stay away from animals and have someone else care for them if possible. How to clean and disinfect objects and surfaces Precautions Some disinfectants work for certain germs and not others. Read the manufacturer's instructions or read online resources to determine if the product you are using will work for the germ you are trying to remove. If you choose to use bleach, use it safely. Never mix it with other cleaning products, especially those that contain ammonia. This mixture can create a dangerous gas that may be deadly. Keep proper movement of fresh air in your home (ventilation). Pour used mop water down the utility sink or toilet. Do not pour this water down the kitchen sink. Objects and surfaces  If surfaces are visibly soiled, clean them first with soap and water before disinfecting. Disinfect surfaces that are frequently touched every day. This may include: Counters. Tables. Doorknobs. Sinks and faucets. Electronics, such as: Architectural technologist. Remote controls. Keyboards. Computers and tablets. Cleaning supplies Some cleaning supplies can breed germs. Take good care of them to prevent germs from spreading. To do this: Soak toilet brushes, mops, and sponges in bleach and water for 5 minutes after each use, or according to manufacturer's instructions. Wash reusable cleaning cloths and sanitize sponges after each use. Throw away disposable gloves after one use. Replace reusable utility gloves if they are cracked or torn or if they start to peel. Additional actions if you are sick If you live with other people:  Avoid close contact with those around you. Stay at least 3 ft (1 m) away from others, if possible. Use a separate bathroom, if possible. If possible, sleep in a separate bedroom or in a  separate bed to prevent infecting other household members. Change bedroom linens each week or whenever they are soiled. Have everyone in the household wash hands often with soap and water for at least 20 seconds. If soap and water are not available, use alcohol-based hand sanitizer. In general: Stay home except to get medical care. Call ahead before visiting your health care provider. Ask others to get groceries and household supplies and to refill prescriptions for you. Avoid public areas. Try not to take public transportation. If you can, wear a mask if you need to go out of the house, or if you are in close contact with someone who is not sick. Avoid visitors until you have completely recovered, or until you have no signs and symptoms of infection. Avoid preparing food or providing care for others. If you must prepare food or provide care for others, wear a mask and wash your hands before and after doing these things. Where to find more information Centers for Disease Control and Prevention: [employer].cy Summary It is important to know how to keep infection from spreading. Make sure everyone in your household washes their hands often with soap and water. Disinfect surfaces that are frequently touched every day. If you are sick, stay home except to get medical care. This  information is not intended to replace advice given to you by your health care provider. Make sure you discuss any questions you have with your health care provider. Document Revised: 04/14/2021 Document Reviewed: 04/14/2021 Elsevier Patient Education  [place] patient to take Vitamin C, D, Zinc.  Keep yourself hydrated with a lot of water and rest. Take Delsym for cough and HBP Cor[place]idan as need. Take Tylenol or pain reliever every 4-6 hours as needed for pain/fever/body ache. If you have elevated blood pressure, you can take OTC Corcidin. You can also take OTC oscillococcinum to help with your symptoms.   Educated patient if symptoms get worse or if she experiences any SOB, chest pain or pain in her legs to seek immediate emergency care. Continue to monitor your oxygen levels. Call us if you have any questions. Quarantine for 5 days if tested positive and no symptoms or 10 days if tested positive and have symptoms. Wear a mask around other people.

## 2021-11-24 NOTE — Progress Notes (Signed)
Virtual Visit via MyChart   This visit type was conducted due to national recommendations for restrictions regarding the COVID-19 Pandemic (e.g. social distancing) in an effort to limit this patient's exposure and mitigate transmission in our community.  Due to her co-morbid illnesses, this patient is at least at moderate risk for complications without adequate follow up.  This format is felt to be most appropriate for this patient at this time.  All issues noted in this document were discussed and addressed.  A limited physical exam was performed with this format.    This visit type was conducted due to national recommendations for restrictions regarding the COVID-19 Pandemic (e.g. social distancing) in an effort to limit this patient's exposure and mitigate transmission in our community.  Patients identity confirmed using two different identifiers.  This format is felt to be most appropriate for this patient at this time.  All issues noted in this document were discussed and addressed.  No physical exam was performed (except for noted visual exam findings with Video Visits).    Date:  12/02/2021   ID:  Jacqueline Bell, DOB May 03, 1950, MRN [id number]  Patient Location:  Home - spoke with Gwinda [place]  Provider location:   Office    Chief Complaint:  Positive covid  History of Present Illness:    Jacqueline Bell is a 71 y.o. female who presents via video conferencing for a telehealth visit today.    The patient does have symptoms concerning for COVID-19 infection (fever, chills, cough, or new shortness of breath).   Virtual visit for positive covid. She is having congestion and coughing on Friday night. She tested positive with covid on Saturday afternoon. She feels like she is doing better. She was at her mother's house on Saturday but she is not positive at this time.      Past Medical History:  Diagnosis Date   Diabetes mellitus without complication ([place])    Mixed  hyperlipidemia 12/04/2020   History reviewed. No pertinent surgical history.   Current Meds  Medication Sig   benzonatate (TESSALON PERLES) 100 MG capsule Take 1 capsule (100 mg total) by mouth every 6 (six) hours as needed.     Allergies:   Iodine, Shellfish allergy, and Tramadol   Social History   Tobacco Use   Smoking status: Never   Smokeless tobacco: Never  Vaping Use   Vaping Use: Never used  Substance Use Topics   Alcohol use: No   Drug use: No     Family Hx: The patient's family history is not on file.  ROS:   Please see the history of present illness.    Review of Systems  Constitutional:  Positive for chills. Negative for fever and malaise/fatigue.  HENT:  Positive for congestion. Negative for sore throat.        Nasal drainage  Respiratory:  Positive for cough (Phlegm in her throat). Negative for shortness of breath and wheezing.   Cardiovascular: Negative.   Gastrointestinal: Negative.   Neurological: Negative.   Psychiatric/Behavioral: Negative.      All other systems reviewed and are negative.   Labs/Other Tests and Data Reviewed:    Recent Labs: 03/18/2021: Hemoglobin 13.6; Platelets 277 06/16/2021: ALT 13; BUN 11; Creatinine, Ser 0.82; Potassium 4.5; Sodium 138   Recent Lipid Panel Lab Results  Component Value Date/Time   CHOL 162 03/18/2021 03:54 PM   TRIG 90 03/18/2021 03:54 PM   HDL 55 03/18/2021 03:54 PM   CHOLHDL 2.9 03/18/2021  03:54 PM   LDLCALC 90 03/18/2021 03:54 PM    Wt Readings from Last 3 Encounters:  10/22/21 167 lb (75.8 kg)  06/16/21 179 lb (81.2 kg)  05/21/21 181 lb 12.8 oz (82.5 kg)     Exam:    Vital Signs:  BP 110/68   Pulse 100   Temp (!) 97.2 F (36.2 C) (Oral)     Physical Exam Vitals reviewed.  Constitutional:      General: She is not in acute distress.    Appearance: Normal appearance.  Pulmonary:     Effort: Pulmonary effort is normal. No respiratory distress.  Skin:    General: Skin is warm and dry.      Capillary Refill: Capillary refill takes less than 2 seconds.  Neurological:     General: No focal deficit present.     Mental Status: She is alert and oriented to person, place, and time. Mental status is at baseline.     Cranial Nerves: No cranial nerve deficit.     Motor: No weakness.  Psychiatric:        Mood and Affect: Mood and affect normal.        Behavior: Behavior normal.        Thought Content: Thought content normal.        Cognition and Memory: Memory normal.        Judgment: Judgment normal.     ASSESSMENT & PLAN:    1. Positive self-administered antigen test for COVID-19 Advised patient to take Vitamin C, D, Zinc.  Keep yourself hydrated with a lot of water and rest. Take Delsym for cough and Mucinex as need. Take Tylenol or pain reliever every 4-6 hours as needed for pain/fever/body ache. If you have elevated blood pressure, you can take OTC Corcidin. You can also take OTC oscillococcinum to help with your symptoms.  Educated patient if symptoms get worse or if she experiences any SOB, chest pain or pain in her legs to seek immediate emergency care. Continue to monitor your oxygen levels. Call us if you have any questions. Quarantine for 5 days if tested positive and no symptoms or 10 days if tested positive and have symptoms. Wear a mask around other people.  She is feeling better and is not interested in the antiviral  2. Acute cough Encouraged to avoid dairy products to keep congestion down - benzonatate (TESSALON PERLES) 100 MG capsule; Take 1 capsule (100 mg total) by mouth every 6 (six) hours as needed.  Dispense: 30 capsule; Refill: 1  3. Runny nose  - fluticasone (FLONASE) 50 MCG/ACT nasal spray; Place 1 spray into both nostrils daily.  Dispense: 16 g; Refill: 2  4. Type 2 diabetes mellitus without complication, with long-term current use of insulin (HCC) Blood sugars are controlled, continue current medications.    COVID-19 Education: The signs and  symptoms of COVID-19 were discussed with the patient and how to seek care for testing (follow up with PCP or arrange E-visit).  The importance of social distancing was discussed today.  Patient Risk:   After full review of this patients clinical status, I feel that they are at least moderate risk at this time.  Time:   Today, I have spent 11 minutes/ seconds with the patient with telehealth technology discussing above diagnoses.     Medication Adjustments/Labs and Tests Ordered: Current medicines are reviewed at length with the patient today.  Concerns regarding medicines are outlined above.   Tests Ordered: No orders of the  defined types were placed in this encounter.   Medication Changes: Meds ordered this encounter  Medications   benzonatate (TESSALON PERLES) 100 MG capsule    Sig: Take 1 capsule (100 mg total) by mouth every 6 (six) hours as needed.    Dispense:  30 capsule    Refill:  1   fluticasone (FLONASE) 50 MCG/ACT nasal spray    Sig: Place 1 spray into both nostrils daily.    Dispense:  16 g    Refill:  2    Disposition:  Follow up prn  Signed, [name], FNP

## 2021-12-01 ENCOUNTER — Encounter (INDEPENDENT_AMBULATORY_CARE_PROVIDER_SITE_OTHER): Payer: Medicare Other | Admitting: Ophthalmology

## 2021-12-01 ENCOUNTER — Ambulatory Visit: Payer: Medicare Other | Admitting: Nurse Practitioner

## 2021-12-03 ENCOUNTER — Ambulatory Visit: Payer: Self-pay

## 2021-12-03 NOTE — Patient Outreach (Signed)
  Care Coordination   12/03/2021 Name: Jacqueline Bell MRN: [id number] DOB: 1951-01-26   Care Coordination Outreach Attempts:  An unsuccessful telephone outreach was attempted for a scheduled appointment today.  Follow Up Plan:  Additional outreach attempts will be made to offer the patient care coordination information and services.   Encounter Outcome:  No Answer  Care Coordination Interventions Activated:  No   Care Coordination Interventions:  No, not indicated    Barb Merino, RN, BSN, CCM Care Management Coordinator [facility] Management  Direct Phone: [phone]

## 2021-12-10 ENCOUNTER — Encounter: Payer: Self-pay | Admitting: Nurse Practitioner

## 2021-12-10 ENCOUNTER — Encounter (INDEPENDENT_AMBULATORY_CARE_PROVIDER_SITE_OTHER): Payer: Medicare Other | Admitting: Ophthalmology

## 2021-12-10 ENCOUNTER — Ambulatory Visit (INDEPENDENT_AMBULATORY_CARE_PROVIDER_SITE_OTHER): Payer: Medicare Other | Admitting: Nurse Practitioner

## 2021-12-10 VITALS — BP 130/62 | HR 85 | Temp 98.4°F | Ht 67.0 in | Wt 166.8 lb

## 2021-12-10 DIAGNOSIS — E782 Mixed hyperlipidemia: Secondary | ICD-10-CM | POA: Diagnosis not present

## 2021-12-10 DIAGNOSIS — E1169 Type 2 diabetes mellitus with other specified complication: Secondary | ICD-10-CM

## 2021-12-10 DIAGNOSIS — E119 Type 2 diabetes mellitus without complications: Secondary | ICD-10-CM

## 2021-12-10 DIAGNOSIS — Z794 Long term (current) use of insulin: Secondary | ICD-10-CM | POA: Diagnosis not present

## 2021-12-10 DIAGNOSIS — I10 Essential (primary) hypertension: Secondary | ICD-10-CM | POA: Diagnosis not present

## 2021-12-10 DIAGNOSIS — Z23 Encounter for immunization: Secondary | ICD-10-CM

## 2021-12-10 DIAGNOSIS — H6123 Impacted cerumen, bilateral: Secondary | ICD-10-CM

## 2021-12-10 MED ORDER — RYBELSUS 14 MG PO TABS: 1.0000 | ORAL_TABLET | Freq: Every day | ORAL | 3 refills | Status: DC

## 2021-12-10 NOTE — Patient Instructions (Signed)

## 2021-12-10 NOTE — Progress Notes (Unsigned)
I,Jacqueline Bell,acting as a Education administrator for [employer], FNP.,have documented all relevant documentation on the behalf of Jacqueline Brine, FNP,as directed by  Jacqueline Brine, FNP while in the presence of Jacqueline Bell, [place].  Subjective:     Patient ID: Jacqueline Bell , female    DOB: August 17, 1950 , 71 y.o.   MRN: [id number]   Chief Complaint  Patient presents with   Diabetes    HPI  The patient is here for a follow-up on her diabetes and blood pressure.  Recently had covid but is doing well. Rarely taking her as needed insulin  Wt Readings from Last 3 Encounters: 12/10/21 : 166 lb 12.8 oz (75.7 kg) 10/22/21 : 167 lb (75.8 kg) 06/16/21 : 179 lb (81.2 kg)    Diabetes She presents for her follow-up diabetic visit. She has type 2 diabetes mellitus. Her disease course has been stable. Pertinent negatives for hypoglycemia include no confusion, dizziness or headaches. There are no diabetic associated symptoms. Pertinent negatives for diabetes include no chest pain, no fatigue, no polydipsia, no polyphagia, no polyuria and no weakness. There are no hypoglycemic complications. Symptoms are stable. There are no diabetic complications. Risk factors for coronary artery disease include sedentary lifestyle and obesity. Current diabetic treatment includes oral agent (dual therapy). She is compliant with treatment all of the time. Her weight is stable. She is following a generally healthy diet. When asked about meal planning, she reported none. She has not had a previous visit with a dietitian. She rarely participates in exercise. There is no change in her home blood glucose trend. (Blood sugar last night was 225 after having cake and lowest was in the 70's. ) An ACE inhibitor/angiotensin II receptor blocker is being taken. She does not see a podiatrist.Eye exam is not current.  Hypertension This is a chronic problem. The current episode started more than 1 year ago. The problem is unchanged. The problem is  uncontrolled. Pertinent negatives include no anxiety, chest pain, headaches, palpitations or shortness of breath. Risk factors for coronary artery disease include obesity, sedentary lifestyle and diabetes mellitus. Past treatments include ACE inhibitors. The current treatment provides mild improvement. There are no compliance problems.  There is no history of angina or kidney disease. There is no history of chronic renal disease.     Past Medical History:  Diagnosis Date   Diabetes mellitus without complication ([place])    Mixed hyperlipidemia 12/04/2020     History reviewed. No pertinent family history.   Current Outpatient Medications:    atorvastatin (LIPITOR) 10 MG tablet, TAKE 1 TABLET DAILY, Disp: 90 tablet, Rfl: 3   benzonatate (TESSALON PERLES) 100 MG capsule, Take 1 capsule (100 mg total) by mouth every 6 (six) hours as needed., Disp: 30 capsule, Rfl: 1   cholecalciferol (VITAMIN D) 1000 units tablet, Take 1,000 Units by mouth daily., Disp: , Rfl:    dapagliflozin propanediol (FARXIGA) 10 MG TABS tablet, Take 1 tablet (10 mg total) by mouth daily before breakfast., Disp: 90 tablet, Rfl: 1   EPINEPHrine 0.3 mg/0.3 mL IJ SOAJ injection, , Disp: , Rfl:    fluticasone (FLONASE) 50 MCG/ACT nasal spray, Place 1 spray into both nostrils daily., Disp: 16 g, Rfl: 2   hydrocortisone cream 1 %, Apply to affected area 2 times daily, Disp: 30 g, Rfl: 1   insulin aspart (NOVOLOG FLEXPEN) 100 UNIT/ML FlexPen, Per SS: < 70 = 0; 70-90 = 4 units; 91-130 = 6 units; 131-150 = 7 units; 151-200 = 8 units;  201-250 = 9 units; 251-300 = 10 units; 301-350 = 11 units; 351-400 = 12 units; 401-450 = 13 units; > 450 = 14 units., Disp: 15 mL, Rfl: 11   loratadine (CLARITIN) 10 MG tablet, TAKE 1 TABLET DAILY, Disp: 90 tablet, Rfl: 3   magnesium oxide (MAG-OX) 400 MG tablet, Take 400 mg by mouth daily., Disp: , Rfl:    meloxicam (MOBIC) 15 MG tablet, TAKE 1 TABLET AS NEEDED, Disp: 90 tablet, Rfl: 3   Multiple Vitamin  (MULTIVITAMIN) tablet, Take 1 tablet by mouth daily., Disp: , Rfl:    Olopatadine HCl (PAZEO) 0.7 % SOLN, Place 1 drop into both eyes daily., Disp: 1 Bottle, Rfl: 5   Omega-3 Fatty Acids (FISH OIL PO), Take 2 capsules by mouth daily. , Disp: , Rfl:    telmisartan (MICARDIS) 20 MG tablet, Take 1 tablet (20 mg total) by mouth daily., Disp: 90 tablet, Rfl: 3   TRESIBA FLEXTOUCH 100 UNIT/ML FlexTouch Pen, INJECT 20 UNITS UNDER THE SKIN DAILY (Patient taking differently: Patient is taking 12-20 units under the skin daily), Disp: 15 mL, Rfl: 3   Semaglutide (RYBELSUS) 14 MG TABS, Take 1 tablet (14 mg total) by mouth daily., Disp: 90 tablet, Rfl: 3   Allergies  Allergen Reactions   Iodine Hives   Shellfish Allergy Hives   Tramadol Rash     Review of Systems  Constitutional: Negative.  Negative for fatigue.  HENT: Negative.    Eyes: Negative.   Respiratory: Negative.  Negative for shortness of breath.   Cardiovascular: Negative.  Negative for chest pain and palpitations.  Gastrointestinal: Negative.   Endocrine: Negative.  Negative for polydipsia, polyphagia and polyuria.  Genitourinary: Negative.   Musculoskeletal: Negative.   Skin: Negative.   Allergic/Immunologic: Negative.   Neurological: Negative.  Negative for dizziness, weakness and headaches.  Hematological: Negative.   Psychiatric/Behavioral: Negative.  Negative for confusion.      Today's Vitals   12/10/21 1015  BP: 130/62  Pulse: 85  Temp: 98.4 F (36.9 C)  TempSrc: Oral  SpO2: 97%  Weight: 166 lb 12.8 oz (75.7 kg)  Height: '5\' 7"'  (1.702 m)   Body mass index is 26.12 kg/m.   Objective:  Physical Exam Vitals reviewed.  Constitutional:      General: She is not in acute distress.    Appearance: Normal appearance. She is obese.  HENT:     Right Ear: External ear normal. There is impacted cerumen.     Left Ear: External ear normal. There is impacted cerumen.  Cardiovascular:     Rate and Rhythm: Normal rate and  regular rhythm.     Pulses: Normal pulses.     Heart sounds: Normal heart sounds. No murmur heard. Pulmonary:     Effort: Pulmonary effort is normal. No respiratory distress.     Breath sounds: Normal breath sounds. No wheezing.  Musculoskeletal:        General: No swelling or tenderness.     Cervical back: Normal range of motion and neck supple.     Right lower leg: No edema.     Left lower leg: No edema.  Skin:    General: Skin is warm and dry.     Capillary Refill: Capillary refill takes less than 2 seconds.  Neurological:     General: No focal deficit present.     Mental Status: She is alert and oriented to person, place, and time.     Cranial Nerves: No cranial nerve deficit.  Motor: No weakness.  Psychiatric:        Mood and Affect: Mood normal.        Behavior: Behavior normal.        Thought Content: Thought content normal.        Judgment: Judgment normal.         Assessment And Plan:     1. Type 2 diabetes mellitus without complication, with long-term current use of insulin (HCC) Comments: HgbA1c was slightly elevated, she is to continue current medications.  - Hemoglobin A1c - CMP14+EGFR - Microalbumin / Creatinine Urine Ratio - Semaglutide (RYBELSUS) 14 MG TABS; Take 1 tablet (14 mg total) by mouth daily.  Dispense: 90 tablet; Refill: 3  2. Essential hypertension Comments: Blood pressure is controlled, continue current medications - CMP14+EGFR  3. Mixed hyperlipidemia Comments: Cholesterol levels are normal, continue statin tolerating medications well  - CMP14+EGFR - Lipid panel  4. Bilateral impacted cerumen Wax is removed by with lavage with elephant ear with 1/2 water and 1/2 peroxide. Instructions for home care to prevent wax buildup are given. - Ear Lavage  5. Need for influenza vaccination Influenza vaccine administered Encouraged to take Tylenol as needed for fever or muscle aches. - Flu Vaccine QUAD High Dose(Fluad)    Patient was given  opportunity to ask questions. Patient verbalized understanding of the plan and was able to repeat key elements of the plan. All questions were answered to their satisfaction.  Jacqueline Brine, FNP   I, Jacqueline Brine, FNP, have reviewed all documentation for this visit. The documentation on 12/10/21 for the exam, diagnosis, procedures, and orders are all accurate and complete.   IF YOU HAVE BEEN REFERRED TO A SPECIALIST, IT MAY TAKE 1-2 WEEKS TO SCHEDULE/PROCESS THE REFERRAL. IF YOU HAVE NOT HEARD FROM US/SPECIALIST IN TWO WEEKS, PLEASE GIVE [place] A CALL AT [phone].   THE PATIENT IS ENCOURAGED TO PRACTICE SOCIAL DISTANCING DUE TO THE COVID-19 PANDEMIC.

## 2021-12-11 LAB — CMP14+EGFR
ALT: 12 IU/L (ref 0–32)
AST: 18 IU/L (ref 0–40)
Albumin/Globulin Ratio: 1.5 (ref 1.2–2.2)
Albumin: 4.6 g/dL (ref 3.9–4.9)
Alkaline Phosphatase: 51 IU/L (ref 44–121)
BUN/Creatinine Ratio: 16 (ref 12–28)
BUN: 13 mg/dL (ref 8–27)
Bilirubin Total: 0.4 mg/dL (ref 0.0–1.2)
CO2: 24 mmol/L (ref 20–29)
Calcium: 9.5 mg/dL (ref 8.7–10.3)
Chloride: 100 mmol/L (ref 96–106)
Creatinine, Ser: 0.81 mg/dL (ref 0.57–1.00)
Globulin, Total: 3.1 g/dL (ref 1.5–4.5)
Glucose: 143 mg/dL — ABNORMAL HIGH (ref 70–99)
Potassium: 4.5 mmol/L (ref 3.5–5.2)
Sodium: 139 mmol/L (ref 134–144)
Total Protein: 7.7 g/dL (ref 6.0–8.5)
eGFR: 79 mL/min/{1.73_m2} (ref >59)

## 2021-12-11 LAB — LIPID PANEL
Chol/HDL Ratio: 2.7 ratio (ref 0.0–4.4)
Cholesterol, Total: 171 mg/dL (ref 100–199)
HDL: 63 mg/dL (ref >39)
LDL Chol Calc (NIH): 93 mg/dL (ref 0–99)
Triglycerides: 81 mg/dL (ref 0–149)
VLDL Cholesterol Cal: 15 mg/dL (ref 5–40)

## 2021-12-11 LAB — MICROALBUMIN / CREATININE URINE RATIO
Creatinine, Urine: 58.5 mg/dL
Microalb/Creat Ratio: <5 mg/g creat (ref 0–29)
Microalbumin, Urine: <3 ug/mL

## 2021-12-11 LAB — HEMOGLOBIN A1C
Est. average glucose Bld gHb Est-mCnc: 180 mg/dL
Hgb A1c MFr Bld: 7.9 % — ABNORMAL HIGH (ref 4.8–5.6)

## 2021-12-12 ENCOUNTER — Telehealth: Payer: Self-pay

## 2021-12-12 NOTE — Telephone Encounter (Signed)
Call to pt reference next PREP call at [place] requesting call back to discuss

## 2021-12-16 ENCOUNTER — Ambulatory Visit: Payer: Self-pay

## 2021-12-16 NOTE — Patient Instructions (Signed)
Visit Information  Thank you for taking time to visit with me today. Please don't hesitate to contact me if I can be of assistance to you.   Following are the goals we discussed today:   Goals Addressed               This Visit's Progress     Patient Stated     COMPLETED: I would like to exercise more (pt-stated)        Care Coordination Interventions: Determined patient has signed up for the provider exercise referral program, she plans to start with the fall sessions      To lower A1c <7.0% (pt-stated)        Care Coordination Interventions: Review of patient status, including review of consultants reports, relevant laboratory and other test results, and medications completed Reviewed medications with patient and discussed importance of medication adherence, determined patient is taking 12 units of Tresiba at bedtime, she sometimes lowers her Tresiba dose due to fear of hypoglycemia while sleeping Collaborated with PCP [name] FNP regarding patient's concerns and request to change medication schedule to taking Tresiba dose in the am Instructed patient per PCP she can switch administration time to am for [place] usage, no increase in dose at this time  Educated patient on dietary and exercise recommendations Reviewed next scheduled PCP office visit for dm check scheduled for 05/06/22 @2PM            Our next appointment is by telephone on 04/20/22 at 12 PM  Please call the care guide team at [phone] if you need to cancel or reschedule your appointment.   If you are experiencing a Mental Health or [place] or need someone to talk to, please call 1-800-273-TALK (toll free, 24 hour hotline)  Patient verbalizes understanding of instructions and care plan provided today and agrees to view in [place]. Active MyChart status and patient understanding of how to access instructions and care plan via MyChart confirmed with patient.     Barb Merino, RN, BSN,  CCM Care Management Coordinator [facility] Direct Phone: [phone]

## 2021-12-16 NOTE — Patient Outreach (Signed)
  Care Coordination   Follow Up Visit Note   12/16/2021 Name: Jacqueline Bell MRN: [id number] DOB: April 12, 1950  Jacqueline Bell is a 71 y.o. year old female who sees Jacqueline Bell, Jacqueline Bell for primary care. I spoke with  Jacqueline Bell by phone today.  What matters to the patients health and wellness today?  Patient would like to work on improving her diabetes by lowering her A1c.     Goals Addressed               This Visit's Progress     Patient Stated     COMPLETED: I would like to exercise more (pt-stated)        Care Coordination Interventions: Determined patient has signed up for the provider exercise referral program, she plans to start with the fall sessions      To lower A1c <7.0% (pt-stated)        Care Coordination Interventions: Review of patient status, including review of consultants reports, relevant laboratory and other test results, and medications completed Reviewed medications with patient and discussed importance of medication adherence, determined patient is taking 12 units of Tresiba at bedtime, she sometimes lowers her Tresiba dose due to fear of hypoglycemia while sleeping Collaborated with PCP Jacqueline Brine FNP regarding patient's concerns and request to change medication schedule to taking Tresiba dose in the am Instructed patient per PCP she can switch administration time to am for [place] usage, no increase in dose at this time  Educated patient on dietary and exercise recommendations Reviewed next scheduled PCP office visit for dm check scheduled for 05/06/22 @2PM            SDOH assessments and interventions completed:  No     Care Coordination Interventions Activated:  Yes  Care Coordination Interventions:  Yes, provided   Follow up plan: Follow up call scheduled for 04/20/22 @12  PM     Encounter Outcome:  Pt. Visit Completed

## 2021-12-22 ENCOUNTER — Telehealth: Payer: Self-pay | Admitting: *Deleted

## 2021-12-22 ENCOUNTER — Other Ambulatory Visit: Payer: Self-pay | Admitting: Nurse Practitioner

## 2021-12-22 DIAGNOSIS — J3089 Other allergic rhinitis: Secondary | ICD-10-CM

## 2021-12-22 DIAGNOSIS — E119 Type 2 diabetes mellitus without complications: Secondary | ICD-10-CM

## 2021-12-22 NOTE — Telephone Encounter (Signed)
Contacted regarding PREP Class referral. Second voice message left for patient this month. Requested return call if she would like to participate in the November or December class.

## 2021-12-22 NOTE — Telephone Encounter (Signed)
Patient returned call and is interested in participating in PREP Class. Accepted 02/10/2022 start date.

## 2021-12-30 ENCOUNTER — Encounter (INDEPENDENT_AMBULATORY_CARE_PROVIDER_SITE_OTHER): Payer: Medicare Other | Admitting: Ophthalmology

## 2022-01-28 ENCOUNTER — Telehealth: Payer: Self-pay | Admitting: *Deleted

## 2022-01-28 NOTE — Telephone Encounter (Signed)
Contacted to schedule PREP Class assessment visit. Scheduled for 1215 on 02/04/2022 at the [facility]. Patient responded with affirmative text message.

## 2022-02-04 ENCOUNTER — Encounter: Payer: Self-pay | Admitting: *Deleted

## 2022-02-04 NOTE — Progress Notes (Signed)
[facility] PREP Evaluation  Patient Details  Name: Jacqueline Bell MRN: [id number] Date of Birth: 1950-08-15 Age: 71 y.o. PCP: Arnette Felts, FNP  Vitals:   02/04/22 1522  BP: 136/64  Pulse: 73  Resp: 20  SpO2: 98%  Weight: 168 lb 6.4 oz (76.4 kg)     [facility] Eval - 02/04/22 1500       [facility] "PREP" Location   [facility] "PREP" Location [facility]      Referral    Referring Provider [place]    Reason for referral High Cholesterol;Hypertension;Inactivity;Diabetes    Program Start Date 02/10/22      Measurement   Waist Circumference 37.75 inches    Hip Circumference 43 inches      Information for Trainer   Goals Weight loss,5-8lbs in 12 weeks, establish an exercise routine, increase muscle tone and strength    Current Exercise nothing consistent    Orthopedic Concerns none    Current Barriers none    Restrictions/Precautions Diabetic snack before exercise      Mobility and Daily Activities   I find it easy to walk up or down two or more flights of stairs. 4    I have no trouble taking out the trash. 4    I do housework such as vacuuming and dusting on my own without difficulty. 4    I can easily lift a gallon of milk (8lbs). 4    I can easily walk a mile. 3    I have no trouble reaching into high cupboards or reaching down to pick up something from the floor. 4    I do not have trouble doing out-door work such as Loss adjuster, chartered, raking leaves, or gardening. 4      Mobility and Daily Activities   I feel younger than my age. 3    I feel independent. 4    I feel energetic. 3    I live an active life.  3    I feel strong. 3    I feel healthy. 3    I feel active as other people my age. 4      How fit and strong are you.   Fit and Strong Total Score 50            Past Medical History:  Diagnosis Date   Diabetes mellitus without complication (HCC)    Mixed hyperlipidemia 12/04/2020   No past surgical history on file. Social History   Tobacco Use  Smoking  Status Never  Smokeless Tobacco Never    [name] 02/04/2022, 3:27 PM  PREP Class to begin 02/10/2022 Tuesdays and Thursdays 1130-1245 at the [facility]. No barriers to attendance identified.

## 2022-02-11 ENCOUNTER — Other Ambulatory Visit: Payer: Self-pay

## 2022-02-11 ENCOUNTER — Encounter: Payer: Self-pay | Admitting: *Deleted

## 2022-02-11 MED ORDER — FREESTYLE LIBRE 2 SENSOR MISC: 3 refills | Status: DC

## 2022-02-11 NOTE — Progress Notes (Signed)
[facility] PREP Weekly Session  Patient Details  Name: Jacqueline Bell MRN: [id number] Date of Birth: April 03, 1950 Age: 71 y.o. PCP: Arnette Felts, FNP  Vitals:   02/10/22 1300  Weight: 166 lb 12.8 oz (75.7 kg)     [facility] Weekly seesion - 02/10/22 1300       [facility] "PREP" Location   [facility] "PREP" Location [facility]      Weekly Session   Topic Discussed Goal setting and welcome to the program   Introductions, workbooks reviewed, tour of facility.   Classes attended to date 1             [name] 02/11/2022, 10:55 AM

## 2022-02-18 ENCOUNTER — Encounter: Payer: Self-pay | Admitting: *Deleted

## 2022-02-18 NOTE — Progress Notes (Signed)
[facility] PREP Weekly Session  Patient Details  Name: Jacqueline Bell MRN: [id number] Date of Birth: 1950/11/16 Age: 71 y.o. PCP: Arnette Felts, FNP  Vitals:   02/17/22 1300  Weight: 166 lb 3.2 oz (75.4 kg)     [facility] Weekly seesion - 02/17/22 1300       [facility] "PREP" Location   [facility] "PREP[facility] Location [facility]      Weekly Session   Topic Discussed Other ways to be active;Importance of resistance training   Reviewed national standards for cardiovascular exercise and resistance training   Minutes exercised this week 150 minutes    Classes attended to date 3             [name] 02/18/2022, 10:29 AM

## 2022-02-24 ENCOUNTER — Encounter: Payer: Self-pay | Admitting: *Deleted

## 2022-02-24 NOTE — Progress Notes (Signed)
[facility] PREP Weekly Session  Patient Details  Name: Jacqueline Bell MRN: [id number] Date of Birth: Jan 15, 1951 Age: 71 y.o. PCP: Arnette Felts, FNP  Vitals:   02/24/22 1647  Weight: 164 lb 3.2 oz (74.5 kg)     [facility] Weekly seesion - 02/24/22 1300       [facility] "PREP" Location   [facility] "PREP[facility] Location [facility]      Weekly Session   Topic Discussed Healthy eating tips   Recommended daily intake of salt and sugar. Discussed importance of healthy carbohydrates, proteins and fats.   Minutes exercised this week 280 minutes    Classes attended to date 5             [name] 02/24/2022, 4:50 PM

## 2022-03-10 ENCOUNTER — Encounter: Payer: Self-pay | Admitting: *Deleted

## 2022-03-10 NOTE — Progress Notes (Signed)
[facility] PREP Weekly Session  Patient Details  Name: Jacqueline Bell MRN: [id number] Date of Birth: 01/02/1951 Age: 72 y.o. PCP: [name], FNP  Vitals:   03/10/22 1300  Weight: 165 lb 3.2 oz (74.9 kg)     [facility] Weekly seesion - 03/10/22 1300       [facility] "PREP" Location   [facility] "PREP" Location [place]      Weekly Session   Topic Discussed Health habits;Water   Water intake: half body weight in oz or 64 oz unless on fluid restriction, Ways to reduce sugar, sugar demo.   Minutes exercised this week 255 minutes    Classes attended to date [place] 03/10/2022, 4:30 PM

## 2022-03-13 ENCOUNTER — Other Ambulatory Visit: Payer: Self-pay | Admitting: Nurse Practitioner

## 2022-03-13 DIAGNOSIS — E119 Type 2 diabetes mellitus without complications: Secondary | ICD-10-CM

## 2022-03-13 MED ORDER — TRESIBA FLEXTOUCH 100 UNIT/ML ~~LOC~~ SOPN: PEN_INJECTOR | SUBCUTANEOUS | 3 refills | Status: DC

## 2022-03-17 ENCOUNTER — Encounter: Payer: Self-pay | Admitting: *Deleted

## 2022-03-17 NOTE — Progress Notes (Signed)
[facility] PREP Weekly Session  Patient Details  Name: Jacqueline Bell MRN: [id number] Date of Birth: August 12, 1950 Age: 72 y.o. PCP: [name], FNP  Vitals:   03/17/22 1717  Weight: 165 lb 3.2 oz (74.9 kg)     [facility] Weekly seesion - 03/17/22 1300       [facility] "PREP" Location   [facility] "PREP[facility] Location [facility]      Weekly Session   Topic Discussed Restaurant Eating   Effects of high sodium, limit salt intake 1500-2300mg /day, reading nutririon labels, salt demo   Minutes exercised this week 220 minutes    Classes attended to date [place], [place] 03/17/2022, 5:20 PM

## 2022-03-24 ENCOUNTER — Encounter: Payer: Self-pay | Admitting: *Deleted

## 2022-03-24 NOTE — Progress Notes (Signed)
[facility] PREP Weekly Session  Patient Details  Name: Jacqueline Bell MRN: [id number] Date of Birth: 1950/05/11 Age: 72 y.o. PCP: [name], FNP  Vitals:   03/24/22 1300  Weight: 165 lb 6.4 oz (75 kg)     [facility] Weekly seesion - 03/24/22 1300       [facility] "PREP" Location   [facility] "PREP[facility] Location [facility]      Weekly Session   Topic Discussed Stress management and problem solving   Importance of sleep for good health, tips for better sleep, guided meditation.   Minutes exercised this week 320 minutes    Classes attended to date [place], [place] 03/24/2022, 8:16 PM

## 2022-03-31 ENCOUNTER — Encounter: Payer: Self-pay | Admitting: *Deleted

## 2022-03-31 NOTE — Progress Notes (Signed)
[facility] PREP Weekly Session  Patient Details  Name: Jacqueline Bell MRN: [id number] Date of Birth: February 23, 1951 Age: 72 y.o. PCP: [name], FNP  Vitals:   03/31/22 1130  Weight: 165 lb 3.2 oz (74.9 kg)     [facility] Weekly seesion - 03/31/22 1300       [facility] "PREP" Location   [facility] "PREP" Location [facility]      Weekly Session   Topic Discussed Other   Portion size matters, tips for portion control, deceptive food labeling, portion size demo.   Minutes exercised this week 330 minutes    Classes attended to date [place], [place] 03/31/2022, 4:42 PM

## 2022-04-07 ENCOUNTER — Encounter: Payer: Self-pay | Admitting: *Deleted

## 2022-04-07 NOTE — Progress Notes (Signed)
[facility] PREP Weekly Session  Patient Details  Name: Jacqueline Bell MRN: [id number] Date of Birth: 1950/04/27 Age: 72 y.o. PCP: [name], FNP  Vitals:   04/07/22 2024  Weight: 166 lb 6.4 oz (75.5 kg)     [facility] Weekly seesion - 04/07/22 1300       [facility] "PREP" Location   [facility] "PREP" Location [facility]      Weekly Session   Topic Discussed Expectations and non-scale victories   Halfway goal review and reset, discuss and trouble shoot barriers, celebrate victories and behavioral modifications towards a healthier lifestyle.   Minutes exercised this week 380 minutes    Classes attended to date [place], [place] 04/07/2022, 8:26 PM

## 2022-04-08 ENCOUNTER — Other Ambulatory Visit: Payer: Self-pay

## 2022-04-08 DIAGNOSIS — E119 Type 2 diabetes mellitus without complications: Secondary | ICD-10-CM

## 2022-04-08 MED ORDER — RYBELSUS 14 MG PO TABS: 1.0000 | ORAL_TABLET | Freq: Every day | ORAL | 3 refills | Status: DC

## 2022-04-08 MED ORDER — FREESTYLE LIBRE 2 SENSOR MISC: 3 refills | Status: DC

## 2022-04-08 MED ORDER — GLUCOSE BLOOD VI STRP: ORAL_STRIP | 12 refills | Status: AC

## 2022-04-14 ENCOUNTER — Encounter: Payer: Self-pay | Admitting: *Deleted

## 2022-04-14 NOTE — Progress Notes (Signed)
[facility] PREP Weekly Session  Patient Details  Name: YICEL SHANNON MRN: [id number] Date of Birth: 04/07/1950 Age: 72 y.o. PCP: [name], FNP  Vitals:   04/14/22 1130  Weight: 164 lb 9.6 oz (74.7 kg)     [facility] Weekly seesion - 04/14/22 1300       [facility] "PREP" Location   [facility] "PREP" Location [facility]      Weekly Session   Topic Discussed Finding support   Accountabilty partners, tips for maintaining momentum, importance of surrounding yourself with positive, encouraging and supportive people.   Minutes exercised this week 400 minutes    Classes attended to date [place], [place] 04/14/2022, 10:22 PM

## 2022-04-15 DIAGNOSIS — I10 Essential (primary) hypertension: Secondary | ICD-10-CM | POA: Insufficient documentation

## 2022-04-16 ENCOUNTER — Ambulatory Visit: Payer: Medicare Other | Admitting: Nurse Practitioner

## 2022-04-20 ENCOUNTER — Ambulatory Visit: Payer: Self-pay

## 2022-04-20 NOTE — Patient Outreach (Signed)
  Care Coordination   04/20/2022 Name: Jacqueline Bell MRN: [id number] DOB: 1951-01-12   Care Coordination Outreach Attempts:  An unsuccessful telephone outreach was attempted for a scheduled appointment today.  Follow Up Plan:  Additional outreach attempts will be made to offer the patient care coordination information and services.   Encounter Outcome:  No Answer   Care Coordination Interventions:  No, not indicated    Barb Merino, RN, BSN, CCM Care Management Coordinator [facility] Direct Phone: [phone]

## 2022-04-21 ENCOUNTER — Encounter: Payer: Self-pay | Admitting: *Deleted

## 2022-04-21 NOTE — Progress Notes (Signed)
[facility] PREP Weekly Session  Patient Details  Name: Jacqueline Bell MRN: [id number] Date of Birth: 07-05-1950 Age: 72 y.o. PCP: [name], FNP  Vitals:   04/21/22 1130  Weight: 164 lb 6.4 oz (74.6 kg)     [facility] Weekly seesion - 04/21/22 1300       [facility] "PREP" Location   [facility] "PREP" Location [facility]      Weekly Session   Topic Discussed Calorie breakdown   Reviewed macronutrients: protein, fats and carbohydrates. Discussed the importance of protein for building muscle. All calories are not the same.   Minutes exercised this week 380 minutes    Classes attended to date [place], [place] 04/21/2022, 9:27 PM

## 2022-04-28 ENCOUNTER — Encounter: Payer: Self-pay | Admitting: *Deleted

## 2022-04-28 NOTE — Progress Notes (Signed)
[facility] PREP Weekly Session  Patient Details  Name: Jacqueline Bell MRN: [id number] Date of Birth: 08-16-1950 Age: 72 y.o. PCP: [name], FNP  Vitals:   04/28/22 1130  Weight: 166 lb (75.3 kg)     [facility] Weekly seesion - 04/28/22 1300       [facility] "PREP" Location   [facility] "PREP" Location [facility]      Weekly Session   Topic Discussed Hitting roadblocks   Review. Troubleshooting barriers. Assign Goals and Activities sheet. Staying positive.   Minutes exercised this week 360 minutes    Classes attended to date [place], [place] 04/28/2022, 10:15 PM

## 2022-04-29 ENCOUNTER — Encounter: Payer: Self-pay | Admitting: Nurse Practitioner

## 2022-04-29 ENCOUNTER — Ambulatory Visit (INDEPENDENT_AMBULATORY_CARE_PROVIDER_SITE_OTHER): Payer: Medicare Other | Admitting: Nurse Practitioner

## 2022-04-29 VITALS — BP 124/58 | HR 74 | Temp 98.6°F | Ht 67.0 in | Wt 164.0 lb

## 2022-04-29 DIAGNOSIS — I10 Essential (primary) hypertension: Secondary | ICD-10-CM

## 2022-04-29 DIAGNOSIS — E113312 Type 2 diabetes mellitus with moderate nonproliferative diabetic retinopathy with macular edema, left eye: Secondary | ICD-10-CM

## 2022-04-29 DIAGNOSIS — E782 Mixed hyperlipidemia: Secondary | ICD-10-CM | POA: Diagnosis not present

## 2022-04-29 DIAGNOSIS — Z79899 Other long term (current) drug therapy: Secondary | ICD-10-CM | POA: Diagnosis not present

## 2022-04-29 DIAGNOSIS — E113392 Type 2 diabetes mellitus with moderate nonproliferative diabetic retinopathy without macular edema, left eye: Secondary | ICD-10-CM

## 2022-04-29 DIAGNOSIS — Z23 Encounter for immunization: Secondary | ICD-10-CM | POA: Diagnosis not present

## 2022-04-29 DIAGNOSIS — Z794 Long term (current) use of insulin: Secondary | ICD-10-CM | POA: Diagnosis not present

## 2022-04-29 MED ORDER — SHINGRIX 50 MCG/0.5ML IM SUSR: 0.5000 mL | Freq: Once | INTRAMUSCULAR | 1 refills | Status: AC

## 2022-04-29 MED ORDER — TETANUS-DIPHTH-ACELL PERTUSSIS 5-2.5-18.5 LF-MCG/0.5 IM SUSP: 0.5000 mL | Freq: Once | INTRAMUSCULAR | 0 refills | Status: AC

## 2022-04-29 NOTE — Progress Notes (Signed)
I,Sheena H Holbrook,acting as a Education administrator for [name], FNP.,have documented all relevant documentation on the behalf of [name], FNP,as directed by  [name], FNP while in the presence of [name], [place].    Subjective:     Patient ID: Jacqueline Bell , female    DOB: Sep 30, 1950 , 72 y.o.   MRN: [id number]   Chief Complaint  Patient presents with   Diabetes    HPI  Patient presents today for DM follow up.   Wt Readings from Last 3 Encounters: 04/29/22 : 164 lb (74.4 kg) 04/28/22 : 166 lb (75.3 kg) 04/21/22 : 164 lb 6.4 oz (74.6 kg)  She has been having to change out her clothes due to them "falling off". She feels good. Has been doing PREP.   Diabetes She presents for her follow-up diabetic visit. She has type 2 diabetes mellitus. Her disease course has been stable. Pertinent negatives for hypoglycemia include no confusion, dizziness or headaches. There are no diabetic associated symptoms. Pertinent negatives for diabetes include no chest pain, no fatigue, no polydipsia, no polyphagia, no polyuria and no weakness. There are no hypoglycemic complications. Symptoms are stable. There are no diabetic complications. Risk factors for coronary artery disease include sedentary lifestyle and obesity. Current diabetic treatment includes oral agent (dual therapy). She is compliant with treatment all of the time. Her weight is stable. She is following a generally healthy diet. When asked about meal planning, she reported none. She has not had a previous visit with a dietitian. She rarely participates in exercise. There is no change in her home blood glucose trend. (Blood sugar today was 113.) An ACE inhibitor/angiotensin II receptor blocker is being taken. She does not see a podiatrist.Eye exam is not current.  Hypertension This is a chronic problem. The current episode started more than 1 year ago. The problem is unchanged. The problem is uncontrolled. Pertinent negatives include no  anxiety, chest pain, headaches, palpitations or shortness of breath. Risk factors for coronary artery disease include obesity, sedentary lifestyle and diabetes mellitus. Past treatments include ACE inhibitors. The current treatment provides mild improvement. There are no compliance problems.  There is no history of angina or kidney disease. There is no history of chronic renal disease.     Past Medical History:  Diagnosis Date   Diabetes mellitus without complication ([place])    Mixed hyperlipidemia 12/04/2020     History reviewed. No pertinent family history.   Current Outpatient Medications:    atorvastatin (LIPITOR) 10 MG tablet, TAKE 1 TABLET DAILY, Disp: 90 tablet, Rfl: 3   benzonatate (TESSALON PERLES) 100 MG capsule, Take 1 capsule (100 mg total) by mouth every 6 (six) hours as needed., Disp: 30 capsule, Rfl: 1   cholecalciferol (VITAMIN D) 1000 units tablet, Take 1,000 Units by mouth daily., Disp: , Rfl:    Continuous Blood Gluc Sensor (FREESTYLE LIBRE 2 SENSOR) MISC, Use to check blood sugar 3 times a day. Dx code E11.65, Disp: 3 each, Rfl: 3   EPINEPHrine 0.3 mg/0.3 mL IJ SOAJ injection, , Disp: , Rfl:    FARXIGA 10 MG TABS tablet, TAKE 1 TABLET DAILY BEFORE BREAKFAST, Disp: 90 tablet, Rfl: 3   fluticasone (FLONASE) 50 MCG/ACT nasal spray, Place 1 spray into both nostrils daily., Disp: 16 g, Rfl: 2   glucose blood test strip, Use as instructed to check blood sugar 3 times a day. Dx code E11.65, Disp: 100 each, Rfl: 12   hydrocortisone cream 1 %, Apply to affected  area 2 times daily, Disp: 30 g, Rfl: 1   insulin aspart (NOVOLOG FLEXPEN) 100 UNIT/ML FlexPen, Per SS: < 70 = 0; 70-90 = 4 units; 91-130 = 6 units; 131-150 = 7 units; 151-200 = 8 units; 201-250 = 9 units; 251-300 = 10 units; 301-350 = 11 units; 351-400 = 12 units; 401-450 = 13 units; > 450 = 14 units., Disp: 15 mL, Rfl: 11   insulin degludec (TRESIBA FLEXTOUCH) 100 UNIT/ML FlexTouch Pen, Patient is taking 12 units under the skin  daily, Disp: 15 mL, Rfl: 3   loratadine (CLARITIN) 10 MG tablet, TAKE 1 TABLET DAILY, Disp: 90 tablet, Rfl: 3   magnesium oxide (MAG-OX) 400 MG tablet, Take 400 mg by mouth daily., Disp: , Rfl:    meloxicam (MOBIC) 15 MG tablet, TAKE 1 TABLET AS NEEDED, Disp: 90 tablet, Rfl: 3   Multiple Vitamin (MULTIVITAMIN) tablet, Take 1 tablet by mouth daily., Disp: , Rfl:    Olopatadine HCl (PAZEO) 0.7 % SOLN, Place 1 drop into both eyes daily., Disp: 1 Bottle, Rfl: 5   Omega-3 Fatty Acids (FISH OIL PO), Take 2 capsules by mouth daily. , Disp: , Rfl:    Semaglutide (RYBELSUS) 14 MG TABS, Take 1 tablet (14 mg total) by mouth daily., Disp: 90 tablet, Rfl: 3   telmisartan (MICARDIS) 20 MG tablet, Take 1 tablet (20 mg total) by mouth daily., Disp: 90 tablet, Rfl: 3   Allergies  Allergen Reactions   Iodine Hives   Shellfish Allergy Hives   Tramadol Rash     Review of Systems  Constitutional:  Negative for fatigue.  Respiratory:  Negative for shortness of breath.   Cardiovascular:  Negative for chest pain and palpitations.  Endocrine: Negative for polydipsia, polyphagia and polyuria.  Neurological:  Negative for dizziness, weakness and headaches.  Psychiatric/Behavioral:  Negative for confusion.   All other systems reviewed and are negative.    Today's Vitals   04/29/22 1211  BP: (!) 124/58  Pulse: 74  Temp: 98.6 F (37 C)  TempSrc: Oral  SpO2: 98%  Weight: 164 lb (74.4 kg)  Height: '5\' 7"'$  (1.702 m)   Body mass index is 25.69 kg/m.   Objective:  Physical Exam Vitals reviewed.  Constitutional:      General: She is not in acute distress.    Appearance: Normal appearance. She is obese.  Cardiovascular:     Rate and Rhythm: Normal rate and regular rhythm.     Pulses: Normal pulses.     Heart sounds: Normal heart sounds. No murmur heard. Pulmonary:     Effort: Pulmonary effort is normal. No respiratory distress.     Breath sounds: Normal breath sounds. No wheezing.  Musculoskeletal:         General: No swelling or tenderness. Normal range of motion.     Cervical back: Normal range of motion and neck supple.     Right lower leg: No edema.     Left lower leg: No edema.  Skin:    General: Skin is warm and dry.     Capillary Refill: Capillary refill takes less than 2 seconds.  Neurological:     General: No focal deficit present.     Mental Status: She is alert and oriented to person, place, and time.     Cranial Nerves: No cranial nerve deficit.     Motor: No weakness.  Psychiatric:        Mood and Affect: Mood normal.  Behavior: Behavior normal.        Thought Content: Thought content normal.        Judgment: Judgment normal.         Assessment And Plan:     1. 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  Continue current medications.  Diabetic foot exam done normal findings. - Hemoglobin A1c  2. 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 Mixed hyperlipidemia Comments: Cholesterol levels are stable. - Lipid panel - CMP14 + Anion Gap  4. Essential hypertension Comments: Blood pressure is well-controlled.  Continue current medications. - CMP14 + Anion Gap  5. Need for Tdap vaccination Comments: Trans Rx is rejected will send prescription to pharmacy - Tdap ([place]) 5-2.5-18.5 LF-MCG/0.5 injection; Inject 0.5 mLs into the muscle once for 1 dose.  Dispense: 0.5 mL; Refill: 0  6. Need for zoster vaccination Comments: Trans Rx is rejected will send prescription to pharmacy - Zoster Vaccine Adjuvanted [facility]) injection; Inject 0.5 mLs into the muscle once for 1 dose. Repeat in 2 -6 months  Dispense: 0.5 mL; Refill: 1  7. Other long term (current) drug therapy - TSH     Patient was given opportunity to ask  questions. Patient verbalized understanding of the plan and was able to repeat key elements of the plan. All questions were answered to their satisfaction.  [name], FNP   I, [name], FNP, have reviewed all documentation for this visit. The documentation on 04/29/22 for the exam, diagnosis, procedures, and orders are all accurate and complete.   IF YOU HAVE BEEN REFERRED TO A SPECIALIST, IT MAY TAKE 1-2 WEEKS TO SCHEDULE/PROCESS THE REFERRAL. IF YOU HAVE NOT HEARD FROM US/SPECIALIST IN TWO WEEKS, PLEASE GIVE [place] A CALL AT [phone].   THE PATIENT IS ENCOURAGED TO PRACTICE SOCIAL DISTANCING DUE TO THE COVID-19 PANDEMIC.

## 2022-04-30 LAB — LIPID PANEL
Chol/HDL Ratio: 2.7 ratio (ref 0.0–4.4)
Cholesterol, Total: 165 mg/dL (ref 100–199)
HDL: 62 mg/dL (ref >39)
LDL Chol Calc (NIH): 90 mg/dL (ref 0–99)
Triglycerides: 70 mg/dL (ref 0–149)
VLDL Cholesterol Cal: 13 mg/dL (ref 5–40)

## 2022-04-30 LAB — CMP14 + ANION GAP
ALT: 20 IU/L (ref 0–32)
AST: 24 IU/L (ref 0–40)
Albumin/Globulin Ratio: 1.7 (ref 1.2–2.2)
Albumin: 4.4 g/dL (ref 3.9–4.9)
Alkaline Phosphatase: 49 IU/L (ref 44–121)
Anion Gap: 14 mmol/L (ref 10.0–18.0)
BUN/Creatinine Ratio: 12 (ref 12–28)
BUN: 9 mg/dL (ref 8–27)
Bilirubin Total: 0.4 mg/dL (ref 0.0–1.2)
CO2: 25 mmol/L (ref 20–29)
Calcium: 9.3 mg/dL (ref 8.7–10.3)
Chloride: 100 mmol/L (ref 96–106)
Creatinine, Ser: 0.78 mg/dL (ref 0.57–1.00)
Globulin, Total: 2.6 g/dL (ref 1.5–4.5)
Glucose: 116 mg/dL — ABNORMAL HIGH (ref 70–99)
Potassium: 4.3 mmol/L (ref 3.5–5.2)
Sodium: 139 mmol/L (ref 134–144)
Total Protein: 7 g/dL (ref 6.0–8.5)
eGFR: 82 mL/min/{1.73_m2} (ref >59)

## 2022-04-30 LAB — TSH: TSH: 0.506 u[IU]/mL (ref 0.450–4.500)

## 2022-04-30 LAB — HEMOGLOBIN A1C
Est. average glucose Bld gHb Est-mCnc: 166 mg/dL
Hgb A1c MFr Bld: 7.4 % — ABNORMAL HIGH (ref 4.8–5.6)

## 2022-05-06 MED ORDER — ATORVASTATIN CALCIUM 20 MG PO TABS: 20.0000 mg | ORAL_TABLET | Freq: Every day | ORAL | 1 refills | Status: DC

## 2022-05-07 ENCOUNTER — Encounter: Payer: Self-pay | Admitting: *Deleted

## 2022-05-07 NOTE — Progress Notes (Signed)
[facility] PREP Evaluation  Patient Details  Name: Jacqueline Bell MRN: [id number] Date of Birth: 12/26/1950 Age: 72 y.o. PCP: [name], FNP  Vitals:   05/07/22 1130  BP: 138/72  Pulse: 80  Resp: 20  SpO2: 98%  Weight: 163 lb 6.4 oz (74.1 kg)  Height: '5\' 7"'$  (1.702 m)     [facility] Eval - 05/07/22 1130       [facility] "PREP" Location   [facility] "PREP" Location [place]      Referral    Referring Provider [place]    Reason for referral High Cholesterol;Hypertension;Inactivity;Diabetes    Program Start Date 02/10/22    Program End Date 05/07/22      Measurement   Waist Circumference 37.75 inches    Waist Circumference End Program 37.5 inches    Hip Circumference 43 inches    Hip Circumference End Program 41.5 inches    Body fat 33.7 percent      Mobility and Daily Activities   I find it easy to walk up or down two or more flights of stairs. 4    I have no trouble taking out the trash. 4    I do housework such as vacuuming and dusting on my own without difficulty. 4    I can easily lift a gallon of milk (8lbs). 4    I can easily walk a mile. 3    I have no trouble reaching into high cupboards or reaching down to pick up something from the floor. 4    I do not have trouble doing out-door work such as Armed forces logistics/support/administrative officer, raking leaves, or gardening. 4      Mobility and Daily Activities   I feel younger than my age. 3    I feel independent. 4    I feel energetic. 3    I live an active life.  4    I feel strong. 3    I feel healthy. 3    I feel active as other people my age. 4      How fit and strong are you.   Fit and Strong Total Score 51            Past Medical History:  Diagnosis Date   Diabetes mellitus without complication ([place])    Mixed hyperlipidemia 12/04/2020   No past surgical history on file. Social History   Tobacco Use  Smoking Status Never  Smokeless Tobacco Never    Jacqueline Bell 05/07/2022, 4:20 PM   Completed PREP. Attended 11 of 11  education classes and 11 of 11 exercise sessions. Jacqueline Bell was very engaged in class and highly motivated to improve her diet and exercise regimen. She regularly came early to class to workout on her own. Significant improvement on FIT Testing scores for cardio march and sit to stand. Weight loss 5 lbs, Waist/hip circumference loss 0.25"/1.25". She has a solid plan in place to continue her success. Well done!

## 2022-05-11 ENCOUNTER — Telehealth: Payer: Self-pay | Admitting: *Deleted

## 2022-05-11 NOTE — Progress Notes (Signed)
  Care Coordination Note  05/11/2022 Name: TASHIMA MAGNUSSEN MRN: [id number] DOB: 1950/10/26  REATA WESTRICK is a 72 y.o. year old female who is a primary care patient of [name], [place] and is actively engaged with the care management team. I reached out to [name] by phone today to assist with re-scheduling a follow up visit with the RN Case Manager  Follow up plan: Unsuccessful telephone outreach attempt made. A HIPAA compliant phone message was left for the patient providing contact information and requesting a return call.  [place]  Direct Dial: [phone]

## 2022-05-15 NOTE — Progress Notes (Signed)
  Care Coordination Note  05/15/2022 Name: Jacqueline Bell MRN: [id number] DOB: 1950-06-11  AYDIN CAVALIERI is a 72 y.o. year old female who is a primary care patient of [name], [place] and is actively engaged with the care management team. I reached out to [name] by phone today to assist with re-scheduling a follow up visit with the RN Case Manager  Follow up plan: Telephone appointment with care management team member scheduled for:05/29/22  [place]: [phone]

## 2022-05-27 ENCOUNTER — Other Ambulatory Visit: Payer: Self-pay | Admitting: Nurse Practitioner

## 2022-05-27 DIAGNOSIS — R0989 Other specified symptoms and signs involving the circulatory and respiratory systems: Secondary | ICD-10-CM

## 2022-05-29 ENCOUNTER — Ambulatory Visit: Payer: Self-pay

## 2022-05-29 NOTE — Patient Outreach (Addendum)
  Care Coordination   Follow Up Visit Note   05/29/2022 Name: Jacqueline Bell MRN: [id number] DOB: [birth year]  Jacqueline Bell is a 72 y.o. year old female who sees Jacqueline Bell, Jacqueline Bell for primary care. I spoke with  Jacqueline Bell by phone today.  What matters to the patients health and wellness today?  Patient will continue to attend the Jacqueline Bell to maintain her established exercise routine. She will verify she is taking Atorvastatin as directed.     Goals Addressed               This Visit's Progress     Patient Stated     To lower A1c <7.0% (pt-stated)        Care Coordination Interventions: Review of patient status, including review of consultants reports, relevant laboratory and other test results, and medications completed Lab Results  Component Value Date   HGBA1C 7.4 (H) 04/29/2022  Care Coordination Interventions: Reviewed medications with patient and discussed importance of medication adherence Advised patient, providing education and rationale, to check cbg daily before breakfast and at bedtime and record, calling PCP for findings outside established parameters Counseled on Diabetic diet, my plate method, [id number] minutes of moderate intensity exercise/week Determined patient's Libre sensor and glucose strips were denied by the [place] Sent referral to Jacqueline Bell for assistance           Other     To lower LDL <70        Care Coordination Interventions: Provider established cholesterol goals reviewed Reviewed role and benefits of statin for ASCVD risk reduction Reviewed importance of limiting foods high in cholesterol Reviewed medications with patient and discussed importance of medication adherence Lipid Panel     Component Value Date/Time   CHOL 165 04/29/2022 1241   TRIG 70 04/29/2022 1241   HDL 62 04/29/2022 1241   CHOLHDL 2.7 04/29/2022 1241   [place] 90 04/29/2022 1241   LABVLDL 13 04/29/2022 1241      Interventions Today    Flowsheet Row  Most Recent Value  Chronic Disease   Chronic disease during today's visit Diabetes, Other  [Allergies,  Hyperlipidemia]  General Interventions   General Interventions Discussed/Reviewed General Interventions Discussed, General Interventions Reviewed, Labs, Lipid Profile, Doctor Visits, Communication with  Labs Kidney Function, Hgb A1c every 6 months  Doctor Visits Discussed/Reviewed Doctor Visits Discussed, Doctor Visits Reviewed, PCP  Communication with Pharmacists  [Jacqueline Bell]  Exercise Interventions   Exercise Discussed/Reviewed Exercise Discussed, Exercise Reviewed, Physical Activity  Physical Activity Discussed/Reviewed PREP, Physical Activity Reviewed, Physical Activity Discussed  Education Interventions   Education Provided Provided Education  Provided Verbal Education On Nutrition, Labs, Medication, Exercise  Labs Reviewed Hgb A1c, Kidney Function, Lipid Profile  Nutrition Interventions   Nutrition Discussed/Reviewed Nutrition Discussed, Nutrition Reviewed, Fluid intake  Pharmacy Interventions   Pharmacy Dicussed/Reviewed Pharmacy Topics Discussed, Medications and their functions          SDOH assessments and interventions completed:  No     Care Coordination Interventions:  Yes, provided   Follow up plan: Referral made to Jacqueline Bell to assist with Jacqueline Bell sensor/glucose test strips Follow up call scheduled for 06/12/22 @1 :30 PM    Encounter Outcome:  Pt. Visit Completed

## 2022-05-29 NOTE — Patient Instructions (Addendum)
Visit Information  Thank you for taking time to visit with me today. Please don't hesitate to contact me if I can be of assistance to you.   Following are the goals we discussed today:   Goals Addressed               This Visit's Progress     Patient Stated     To lower A1c <7.0% (pt-stated)        Care Coordination Interventions: Review of patient status, including review of consultants reports, relevant laboratory and other test results, and medications completed Lab Results  Component Value Date   HGBA1C 7.4 (H) 04/29/2022  Care Coordination Interventions: Reviewed medications with patient and discussed importance of medication adherence Advised patient, providing education and rationale, to check cbg daily before breakfast and at bedtime and record, calling PCP for findings outside established parameters Counseled on Diabetic diet, my plate method, [id number] minutes of moderate intensity exercise/week Determined patient's Libre sensor and glucose strips were denied by the [place] Sent referral to [place] for assistance        Other     To lower LDL <70        Care Coordination Interventions: Provider established cholesterol goals reviewed Reviewed role and benefits of statin for ASCVD risk reduction Reviewed importance of limiting foods high in cholesterol Reviewed medications with patient and discussed importance of medication adherence Lipid Panel     Component Value Date/Time   CHOL 165 04/29/2022 1241   TRIG 70 04/29/2022 1241   HDL 62 04/29/2022 1241   CHOLHDL 2.7 04/29/2022 1241   [place] 90 04/29/2022 1241   LABVLDL 13 04/29/2022 1241              Our next appointment is by telephone on 06/12/22 at 1:30 PM  Please call the care guide team at [phone] if you need to cancel or reschedule your appointment.   If you are experiencing a Mental Health or [place] or need someone to talk to, please call 1-800-273-TALK (toll free, 24  hour hotline) go to [facility] [address] [place] [phone])  Patient verbalizes understanding of instructions and care plan provided today and agrees to view in [place]. Active MyChart status and patient understanding of how to access instructions and care plan via MyChart confirmed with patient.     Barb Merino, RN, BSN, CCM Care Management Coordinator [facility] Care Management Direct Phone: [phone]

## 2022-06-01 ENCOUNTER — Telehealth: Payer: Self-pay | Admitting: *Deleted

## 2022-06-01 NOTE — Progress Notes (Unsigned)
  Care Coordination Note  06/01/2022 Name: Jacqueline Bell MRN: [id number] DOB: 1950/11/11  Jacqueline Bell is a 72 y.o. year old female who is a primary care patient of [name], [place] and is actively engaged with the care management team. I reached out to [name] by phone today to assist with re-scheduling a follow up visit with the RN Case Manager  Follow up plan: Unsuccessful telephone outreach attempt made. A HIPAA compliant phone message was left for the patient providing contact information and requesting a return call.   [place]  Direct Dial: [phone]

## 2022-06-02 ENCOUNTER — Telehealth: Payer: Self-pay

## 2022-06-02 NOTE — Progress Notes (Cosign Needed)
06-02-2022: contacted [employer] and was told freestyle libre 2 and dexcom G6 is covered with insurance with an approved PA. Jacqueline Bell was informed.  [place] Pharmacist Assistant [phone]

## 2022-06-04 NOTE — Progress Notes (Signed)
  Care Coordination Note  06/04/2022 Name: EULANDA CANTLON MRN: [id number] DOB: [birth year]  ZAMAYA SCHOSSOW is a 72 y.o. year old female who is a primary care patient of [name], [place] and is actively engaged with the care management team. I reached out to [name] by phone today to assist with re-scheduling a follow up visit with the RN Case Manager  Follow up plan: Telephone appointment with care management team member scheduled for:06/17/22  [place]: [phone]

## 2022-06-04 NOTE — Progress Notes (Signed)
  Care Coordination Note  06/04/2022 Name: [name] MRN: [id number] DOB: [birth year]  Jacqueline Bell is a 72 y.o. year old female who is a primary care patient of [name], [place] and is actively engaged with the care management team. I reached out to [name] by phone today to assist with re-scheduling a follow up visit with the RN Case Manager  Follow up plan: Unsuccessful telephone outreach attempt made. A HIPAA compliant phone message was left for the patient providing contact information and requesting a return call.   [place]  Direct Dial: [phone]

## 2022-06-29 ENCOUNTER — Ambulatory Visit: Payer: Self-pay

## 2022-06-29 NOTE — Patient Outreach (Signed)
  Care Coordination   06/29/2022 Name: JHERI MITTER MRN: [id number] DOB: 05/22/1950   Care Coordination Outreach Attempts:  An unsuccessful telephone outreach was attempted for a scheduled appointment today.  Follow Up Plan:  Additional outreach attempts will be made to offer the patient care coordination information and services.   Encounter Outcome:  No Answer   Care Coordination Interventions:  No, not indicated    Delsa Sale, RN, BSN, CCM Care Management Coordinator [facility] Care Management  Direct Phone: [phone]

## 2022-07-06 DIAGNOSIS — Z1231 Encounter for screening mammogram for malignant neoplasm of breast: Secondary | ICD-10-CM | POA: Diagnosis not present

## 2022-07-06 LAB — HM MAMMOGRAPHY: HM Mammogram: NORMAL (ref 0–4)

## 2022-07-20 ENCOUNTER — Encounter: Payer: Self-pay | Admitting: Nurse Practitioner

## 2022-07-21 ENCOUNTER — Other Ambulatory Visit: Payer: Self-pay | Admitting: Nurse Practitioner

## 2022-07-21 DIAGNOSIS — E113392 Type 2 diabetes mellitus with moderate nonproliferative diabetic retinopathy without macular edema, left eye: Secondary | ICD-10-CM

## 2022-07-21 DIAGNOSIS — E113312 Type 2 diabetes mellitus with moderate nonproliferative diabetic retinopathy with macular edema, left eye: Secondary | ICD-10-CM

## 2022-07-21 DIAGNOSIS — I10 Essential (primary) hypertension: Secondary | ICD-10-CM

## 2022-07-21 DIAGNOSIS — E782 Mixed hyperlipidemia: Secondary | ICD-10-CM

## 2022-07-22 ENCOUNTER — Telehealth: Payer: Self-pay

## 2022-07-22 NOTE — Progress Notes (Signed)
Care Management & Coordination Services Pharmacy Team  Reason for Encounter: Appointment Reminder  Contacted patient to confirm telephone appointment with Cherylin Mylar, PharmD on 07-24-2022 at 1:00. Spoke with patient on 07/22/2022   Do you have any problems getting your medications? Yes Patient stated she is having issues with getting freestyle libre.   What is your top health concern you would like to discuss at your upcoming visit? No concerns  Have you seen any other providers since your last visit with PCP? No   Chart review: Recent office visits:  05-29-2022 Little, Karma Lew, RN [facility]).  04-29-2022 Arnette Felts, FNP. Visit for Tdap vaccine.  Recent consult visits:  05-07-2022 [name], RN [facility]). Final PREP class.  04-28-2022 [name], RN ([facility]). PREP class.  04-21-2022 [name], RN ([facility]). PREP class.  04-14-2022 [name], RN ([facility]). PREP class.  04-07-2022 [name], RN ([facility]). PREP class.  03-31-2022 [name], RN ([facility]). PREP class.  03-24-2022 [name], RN ([facility]). PREP class.  03-17-2022 [name], RN ([facility]). PREP class.  03-13-2022 [name], RN ([facility]). PREP class.  03-10-2022 [name], RN ([facility]). PREP class.  02-24-2022 [name], RN ([facility]). PREP class.  02-18-2022 [name], RN ([facility]). PREP class.  02-11-2022 [name], RN ([facility]). PREP class.  02-04-2022 [name], RN ([facility]). Initial PREP class visit.  Hospital visits:  None in previous 6 months   Star Rating Drugs:  Atorvastatin 10 mg- Last filled 05-07-2022 [facility]. Previous 01-10-2022 [facility]. Farxiga 10 mg- Last filled 07-20-2022 [facility]. Previous 04-07-2022 90 [facility] Telmisartan 20 mg- Last filled 07-20-2022 [facility]. Previous 04-03-2022 90 DS Rybelsus 14 mg- Last filled 07-15-2022 [facility]. Previous 04-28-2022 90 [facility].  Care Gaps: Annual wellness visit  in last year? Yes Covid booster overdue 2nd Tdap overdue Shingrix overdue  If Diabetic: Last eye exam / retinopathy screening:10-01-2021 Last diabetic foot exam: 03-18-2021  Huey Romans [facility] Clinical Pharmacist Assistant [phone]

## 2022-07-24 ENCOUNTER — Ambulatory Visit: Payer: Self-pay

## 2022-07-24 NOTE — Patient Outreach (Signed)
  Care Coordination   Follow Up Visit Note   07/24/2022 Name: Jacqueline Bell MRN: [id number] DOB: [birth year]  Jacqueline Bell is a 72 y.o. year old female who sees Arnette Felts, FNP for primary care. I spoke with  Jacqueline Bell by phone today.  What matters to the patients health and wellness today?  Patient would like to monitor her blood sugars by use of continuous blood glucose monitoring.     Goals Addressed               This Visit's Progress     Patient Stated     To lower A1c <7.0% (pt-stated)        Care Coordination Interventions: Reviewed scheduled/upcoming provider appointment including: telephone follow up with Cherylin Mylar Pharm D scheduled for today, 07/24/22 @1  PM Advised patient her insurance covers a [place] and Dexcom 6 sensor for continuous glucose monitor with a PA Encouraged patient to ask the pharmacist about best allergy medication to take as well as coverage for test strips for manual glucometer  Reviewed scheduled/upcoming provider appointment including: next PCP follow up appointment scheduled for 09/02/22 @11 :40 AM for a diabetes check Discussed and reviewed with patient next scheduled RN CC follow up call     Interventions Today    Flowsheet Row Most Recent Value  Chronic Disease   Chronic disease during today's visit Diabetes  General Interventions   General Interventions Discussed/Reviewed General Interventions Discussed, General Interventions Reviewed, Durable Medical Equipment (DME)  Durable Medical Equipment (DME) Glucomoter  Education Interventions   Education Provided Provided Education  Provided Verbal Education On Blood Sugar Monitoring  Pharmacy Interventions   Pharmacy Dicussed/Reviewed Pharmacy Topics Discussed, Pharmacy Topics Reviewed, Medications and their functions          SDOH assessments and interventions completed:  No     Care Coordination Interventions:  Yes, provided   Follow up plan: Follow up call scheduled  for 09/04/22 @11 :30 AM    Encounter Outcome:  Pt. Visit Completed

## 2022-07-24 NOTE — Patient Instructions (Signed)
Visit Information  Thank you for taking time to visit with me today. Please don't hesitate to contact me if I can be of assistance to you.   Following are the goals we discussed today:   Goals Addressed               This Visit's Progress     Patient Stated     To lower A1c <7.0% (pt-stated)        Care Coordination Interventions: Reviewed scheduled/upcoming provider appointment including: telephone follow up with Cherylin Mylar Pharm D scheduled for today, 07/24/22 @1  PM Advised patient her insurance covers a [place] and Dexcom 6 sensor for continuous glucose monitor with a PA Encouraged patient to ask the pharmacist about best allergy medication to take as well as coverage for test strips for manual glucometer  Reviewed scheduled/upcoming provider appointment including: next PCP follow up appointment scheduled for 09/02/22 @11 :40 AM for a diabetes check Discussed and reviewed with patient next scheduled RN CC follow up call         Our next appointment is by telephone on 09/04/22 at 12:30 PM   Please call the care guide team at [phone] if you need to cancel or reschedule your appointment.   If you are experiencing a Mental Health or Behavioral Health Crisis or need someone to talk to, please call 1-800-273-TALK (toll free, 24 hour hotline) go to [facility] [address], [place] [phone])  Patient verbalizes understanding of instructions and care plan provided today and agrees to view in MyChart. Active MyChart status and patient understanding of how to access instructions and care plan via MyChart confirmed with patient.     Delsa Sale, RN, BSN, CCM Care Management Coordinator [facility] Care Management  Direct Phone: [phone]

## 2022-08-28 ENCOUNTER — Other Ambulatory Visit: Payer: Self-pay | Admitting: Nurse Practitioner

## 2022-08-28 ENCOUNTER — Telehealth: Payer: Self-pay

## 2022-08-28 MED ORDER — EMPAGLIFLOZIN 10 MG PO TABS
10.0000 mg | ORAL_TABLET | Freq: Every day | ORAL | 1 refills | Status: DC
Start: 2022-08-28 — End: 2022-12-23

## 2022-08-28 NOTE — Telephone Encounter (Signed)
Called patient to let her know that her  [name] has been changed to [place] and sent to mail order if she can come and pick up samples 10mg . VM was left to call back.

## 2022-09-02 ENCOUNTER — Ambulatory Visit (INDEPENDENT_AMBULATORY_CARE_PROVIDER_SITE_OTHER): Payer: Medicare Other | Admitting: Nurse Practitioner

## 2022-09-02 ENCOUNTER — Encounter: Payer: Self-pay | Admitting: Nurse Practitioner

## 2022-09-02 ENCOUNTER — Telehealth: Payer: Self-pay

## 2022-09-02 VITALS — BP 130/82 | HR 86 | Temp 98.7°F | Ht 67.0 in | Wt 163.0 lb

## 2022-09-02 DIAGNOSIS — R252 Cramp and spasm: Secondary | ICD-10-CM | POA: Insufficient documentation

## 2022-09-02 DIAGNOSIS — I10 Essential (primary) hypertension: Secondary | ICD-10-CM | POA: Diagnosis not present

## 2022-09-02 DIAGNOSIS — E785 Hyperlipidemia, unspecified: Secondary | ICD-10-CM

## 2022-09-02 DIAGNOSIS — E113392 Type 2 diabetes mellitus with moderate nonproliferative diabetic retinopathy without macular edema, left eye: Secondary | ICD-10-CM

## 2022-09-02 DIAGNOSIS — E782 Mixed hyperlipidemia: Secondary | ICD-10-CM

## 2022-09-02 DIAGNOSIS — E559 Vitamin D deficiency, unspecified: Secondary | ICD-10-CM

## 2022-09-02 DIAGNOSIS — Z794 Long term (current) use of insulin: Secondary | ICD-10-CM | POA: Insufficient documentation

## 2022-09-02 DIAGNOSIS — Z79899 Other long term (current) drug therapy: Secondary | ICD-10-CM | POA: Diagnosis not present

## 2022-09-02 DIAGNOSIS — E113312 Type 2 diabetes mellitus with moderate nonproliferative diabetic retinopathy with macular edema, left eye: Secondary | ICD-10-CM

## 2022-09-02 MED ORDER — TRESIBA FLEXTOUCH 100 UNIT/ML ~~LOC~~ SOPN: PEN_INJECTOR | SUBCUTANEOUS | 3 refills | Status: DC

## 2022-09-02 MED ORDER — NOVOLOG FLEXPEN 100 UNIT/ML ~~LOC~~ SOPN: PEN_INJECTOR | SUBCUTANEOUS | 11 refills | Status: DC

## 2022-09-02 NOTE — Progress Notes (Addendum)
I,Jameka J Llittleton, CMA,acting as a Neurosurgeon for [employer], FNP.,have documented all relevant documentation on the behalf of Arnette Felts, FNP,as directed by  Arnette Felts, FNP while in the presence of Arnette Felts, FNP.  Subjective:  Patient ID: Jacqueline Bell , female    DOB: 20-Jul-1950 , 72 y.o.   MRN: [id number]  Chief Complaint  Patient presents with   Hypertension   Diabetes    HPI  The patient is here for a follow-up on her diabetes and blood pressure.  Patient reports compliance with her meds. Patient stated she was supposed to get a freestyle sensors or a meter she stated she just bought one over the counter but she would like a prescription for both. Patient reports she is checking her blood sugars 6 times a day. She will have low blood sugars down to 60  Wt Readings from Last 3 Encounters: 09/02/22 : 163 lb (73.9 kg) 05/07/22 : 163 lb 6.4 oz (74.1 kg) 04/29/22 : 164 lb (74.4 kg)     Diabetes She presents for her follow-up diabetic visit. She has type 2 diabetes mellitus. Her disease course has been stable. Pertinent negatives for hypoglycemia include no confusion, dizziness or headaches. There are no diabetic associated symptoms. Pertinent negatives for diabetes include no chest pain, no fatigue, no polydipsia, no polyphagia, no polyuria and no weakness. There are no hypoglycemic complications. Symptoms are stable. Diabetic complications include retinopathy. Risk factors for coronary artery disease include sedentary lifestyle, diabetes mellitus and dyslipidemia. Current diabetic treatment includes oral agent (dual therapy). She is compliant with treatment all of the time. Her weight is stable. She is following a generally healthy diet. When asked about meal planning, she reported none. She has not had a previous visit with a dietitian. She rarely participates in exercise. There is no change in her home blood glucose trend. (Blood sugar before bed was 187 this morning was 117)  An ACE inhibitor/angiotensin II receptor blocker is being taken. She does not see a podiatrist.Eye exam is not current.  Hypertension This is a chronic problem. The current episode started more than 1 year ago. The problem is unchanged. The problem is uncontrolled. Pertinent negatives include no anxiety, chest pain, headaches, palpitations or shortness of breath. Risk factors for coronary artery disease include obesity, sedentary lifestyle and diabetes mellitus. Past treatments include ACE inhibitors. The current treatment provides mild improvement. There are no compliance problems.  Hypertensive end-organ damage includes retinopathy. There is no history of angina or kidney disease. There is no history of chronic renal disease.     Past Medical History:  Diagnosis Date   Diabetes mellitus without complication (HCC)    Mixed hyperlipidemia 12/04/2020   Positive self-administered antigen test for COVID-19 11/24/2021     History reviewed. No pertinent family history.   Current Outpatient Medications:    atorvastatin (LIPITOR) 20 MG tablet, Take 1 tablet (20 mg total) by mouth daily., Disp: 90 tablet, Rfl: 1   benzonatate (TESSALON PERLES) 100 MG capsule, Take 1 capsule (100 mg total) by mouth every 6 (six) hours as needed., Disp: 30 capsule, Rfl: 1   cholecalciferol (VITAMIN D) 1000 units tablet, Take 1,000 Units by mouth daily., Disp: , Rfl:    empagliflozin (JARDIANCE) 10 MG TABS tablet, Take 1 tablet (10 mg total) by mouth daily before breakfast., Disp: 90 tablet, Rfl: 1   EPINEPHrine 0.3 mg/0.3 mL IJ SOAJ injection, , Disp: , Rfl:    ezetimibe (ZETIA) 10 MG tablet, Take 1 tablet (  10 mg total) by mouth daily., Disp: 90 tablet, Rfl: 1   fluticasone (FLONASE) 50 MCG/ACT nasal spray, SHAKE LIQUID AND USE 1 SPRAY IN EACH NOSTRIL DAILY, Disp: 16 g, Rfl: 2   loratadine (CLARITIN) 10 MG tablet, TAKE 1 TABLET DAILY, Disp: 90 tablet, Rfl: 3   magnesium oxide (MAG-OX) 400 MG tablet, Take 400 mg by  mouth daily., Disp: , Rfl:    meloxicam (MOBIC) 15 MG tablet, TAKE 1 TABLET AS NEEDED, Disp: 90 tablet, Rfl: 3   Multiple Vitamin (MULTIVITAMIN) tablet, Take 1 tablet by mouth daily., Disp: , Rfl:    Olopatadine HCl (PAZEO) 0.7 % SOLN, Place 1 drop into both eyes daily., Disp: 1 Bottle, Rfl: 5   Omega-3 Fatty Acids (FISH OIL PO), Take 2 capsules by mouth daily. , Disp: , Rfl:    telmisartan (MICARDIS) 20 MG tablet, Take 1 tablet (20 mg total) by mouth daily., Disp: 90 tablet, Rfl: 3   Continuous Blood Gluc Sensor (FREESTYLE LIBRE 2 SENSOR) MISC, Use to check blood sugar 3 times a day. Dx code E11.65 (Patient not taking: Reported on 09/02/2022), Disp: 3 each, Rfl: 3   glucose blood test strip, Use as instructed to check blood sugar 3 times a day. Dx code E11.65 (Patient not taking: Reported on 09/02/2022), Disp: 100 each, Rfl: 12   insulin aspart (NOVOLOG FLEXPEN) 100 UNIT/ML FlexPen, Per SS: < 150 = 0; 150-199 = 3 units; 200-249 = 6 units; 250-299 = 9 units; 300-349= 12 units; >350 = 15 units, Disp: 15 mL, Rfl: 11   insulin degludec (TRESIBA FLEXTOUCH) 100 UNIT/ML FlexTouch Pen, Patient is taking 14 units under the skin daily, Disp: 15 mL, Rfl: 3   RYBELSUS 14 MG TABS, TAKE 1 TABLET DAILY, Disp: 90 tablet, Rfl: 3   Allergies  Allergen Reactions   Iodine Hives   Shellfish Allergy Hives   Tramadol Rash     Review of Systems  Constitutional:  Negative for fatigue.  Respiratory:  Negative for shortness of breath.   Cardiovascular:  Negative for chest pain and palpitations.  Endocrine: Negative for polydipsia, polyphagia and polyuria.  Neurological:  Negative for dizziness, weakness and headaches.  Psychiatric/Behavioral:  Negative for confusion.      Today's Vitals   09/02/22 1139  BP: 130/82  Pulse: 86  Temp: 98.7 F (37.1 C)  Weight: 163 lb (73.9 kg)  Height: 5\' 7"  (1.702 m)  PainSc: 0-No pain   Body mass index is 25.53 kg/m.  Wt Readings from Last 3 Encounters:  09/02/22 163 lb  (73.9 kg)  05/07/22 163 lb 6.4 oz (74.1 kg)  04/29/22 164 lb (74.4 kg)    The 10-year ASCVD risk score (Arnett DK, et al., 2019) is: 23%   Values used to calculate the score:     Age: 85 years     Sex: Female     Is Non-Hispanic African American: Yes     Diabetic: Yes     Tobacco smoker: No     Systolic Blood Pressure: 130 mmHg     Is BP treated: Yes     HDL Cholesterol: 58 mg/dL     Total Cholesterol: 163 mg/dL  Objective:  Physical Exam Vitals reviewed.  Constitutional:      General: She is not in acute distress.    Appearance: Normal appearance. She is obese.  Cardiovascular:     Rate and Rhythm: Normal rate and regular rhythm.     Pulses: Normal pulses.     Heart  sounds: Normal heart sounds. No murmur heard. Pulmonary:     Effort: Pulmonary effort is normal. No respiratory distress.     Breath sounds: Normal breath sounds. No wheezing.  Musculoskeletal:        General: No swelling or tenderness. Normal range of motion.     Right lower leg: No edema.     Left lower leg: No edema.     Comments: She is limping slightly when walking  Skin:    General: Skin is warm and dry.     Capillary Refill: Capillary refill takes less than 2 seconds.  Neurological:     General: No focal deficit present.     Mental Status: She is alert and oriented to person, place, and time.     Cranial Nerves: No cranial nerve deficit.     Motor: No weakness.  Psychiatric:        Mood and Affect: Mood normal.        Behavior: Behavior normal.        Thought Content: Thought content normal.        Judgment: Judgment normal.         Assessment And Plan:  1. 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 - Basic metabolic panel - Hemoglobin A1c - insulin aspart (NOVOLOG FLEXPEN) 100 UNIT/ML FlexPen; Per SS: < 150 = 0;  150-199 = 3 units; 200-249 = 6 units; 250-299 = 9 units; 300-349= 12 units; >350 = 15 units  Dispense: 15 mL; Refill: 11 - AMB Referral to Chronic Care Management Services - insulin degludec (TRESIBA FLEXTOUCH) 100 UNIT/ML FlexTouch Pen; Patient is taking 14 units under the skin daily  Dispense: 15 mL; Refill: 3  2. Mixed hyperlipidemia Comments: Cholesterol levels are stable, continune statin, tolerating well - Lipid panel  3. Essential hypertension Comments: Blood pressure is fairly controlled. Continue low salt diet  4. Vitamin D deficiency  5. Leg cramps Comments: Encouraged to stay well hydrated with water and take coq10 daily to help.  6. Other long term (current) drug therapy - CBC  7. Hyperlipidemia LDL goal <70 - ezetimibe (ZETIA) 10 MG tablet; Take 1 tablet (10 mg total) by mouth daily.  Dispense: 90 tablet; Refill: 1    No follow-ups on file.   Patient was given opportunity to ask questions. Patient verbalized understanding of the plan and was able to repeat key elements of the plan. All questions were answered to their satisfaction.  Arnette Felts, FNP   I, Arnette Felts, FNP, have reviewed all documentation for this visit. The documentation on 09/02/22 for the exam, diagnosis, procedures, and orders are all accurate and complete.  IF YOU HAVE BEEN REFERRED TO A SPECIALIST, IT MAY TAKE 1-2 WEEKS TO SCHEDULE/PROCESS THE REFERRAL. IF YOU HAVE NOT HEARD FROM US/SPECIALIST IN TWO WEEKS, PLEASE GIVE [place] A CALL AT [phone].

## 2022-09-02 NOTE — Telephone Encounter (Signed)
Freestyle libre 3 monitor order has been placed on parachute. YL,RMA

## 2022-09-03 LAB — LIPID PANEL
Chol/HDL Ratio: 2.8 ratio (ref 0.0–4.4)
Cholesterol, Total: 163 mg/dL (ref 100–199)
HDL: 58 mg/dL (ref >39)
LDL Chol Calc (NIH): 89 mg/dL (ref 0–99)
Triglycerides: 87 mg/dL (ref 0–149)
VLDL Cholesterol Cal: 16 mg/dL (ref 5–40)

## 2022-09-03 LAB — BASIC METABOLIC PANEL
BUN/Creatinine Ratio: 14 (ref 12–28)
BUN: 10 mg/dL (ref 8–27)
CO2: 24 mmol/L (ref 20–29)
Calcium: 8.8 mg/dL (ref 8.7–10.3)
Chloride: 103 mmol/L (ref 96–106)
Creatinine, Ser: 0.74 mg/dL (ref 0.57–1.00)
Glucose: 113 mg/dL — ABNORMAL HIGH (ref 70–99)
Potassium: 3.9 mmol/L (ref 3.5–5.2)
Sodium: 142 mmol/L (ref 134–144)
eGFR: 87 mL/min/{1.73_m2} (ref >59)

## 2022-09-03 LAB — CBC
Hematocrit: 40 % (ref 34.0–46.6)
Hemoglobin: 13.5 g/dL (ref 11.1–15.9)
MCH: 29.4 pg (ref 26.6–33.0)
MCHC: 33.8 g/dL (ref 31.5–35.7)
MCV: 87 fL (ref 79–97)
Platelets: 232 10*3/uL (ref 150–450)
RBC: 4.59 x10E6/uL (ref 3.77–5.28)
RDW: 12.8 % (ref 11.7–15.4)
WBC: 2.9 10*3/uL — ABNORMAL LOW (ref 3.4–10.8)

## 2022-09-03 LAB — HEMOGLOBIN A1C
Est. average glucose Bld gHb Est-mCnc: 163 mg/dL
Hgb A1c MFr Bld: 7.3 % — ABNORMAL HIGH (ref 4.8–5.6)

## 2022-09-04 ENCOUNTER — Ambulatory Visit: Payer: Self-pay

## 2022-09-07 ENCOUNTER — Other Ambulatory Visit: Payer: Self-pay

## 2022-09-07 NOTE — Patient Outreach (Signed)
  Care Coordination   Follow Up Visit Note   09/04/2022 Name: Jacqueline Bell MRN: [id number] DOB: Nov 04, 1950  Jacqueline Bell is a 72 y.o. year old female who sees Arnette Felts, FNP for primary care. I spoke with  [name] by phone today.  What matters to the patients health and wellness today?  Patient would like to continue her routine exercise regimen and lower her intake of saturated and trans fat to help lower her LDL.     Goals Addressed               This Visit's Progress     Patient Stated     To lower A1c <7.0% (pt-stated)        Care Coordination Interventions: Review of patient status, including review of consultant's reports, relevant laboratory and other test results, and medications completed Counseled on Diabetic diet, my plate method, 562 minutes of moderate intensity exercise/week Mailed printed educational materials related to the Diabetic Diet  Assessed patient's understanding of A1c goal: <7% Lab Results  Component Value Date   HGBA1C 7.3 (H) 09/02/2022       Other     To lower LDL <70        Care Coordination Interventions: Provider established cholesterol goals reviewed Review of patient status, including review of consultant's reports, relevant laboratory and other test results, and medications completed Reviewed role and benefits of statin for ASCVD risk reduction Reviewed importance of limiting foods high in cholesterol Reviewed exercise goals and target of 150 minutes per week Mailed printed educational materials related to Cholesterol Management  Lipid Panel     Component Value Date/Time   CHOL 163 09/02/2022 1239   TRIG 87 09/02/2022 1239   HDL 58 09/02/2022 1239   CHOLHDL 2.8 09/02/2022 1239   LDLCALC 89 09/02/2022 1239   LABVLDL 16 09/02/2022 1239       Interventions Today    Flowsheet Row Most Recent Value  Chronic Disease   Chronic disease during today's visit Diabetes, Other, Hypertension (HTN)  [Hyperlipidemia]   General Interventions   General Interventions Discussed/Reviewed General Interventions Discussed, General Interventions Reviewed, Labs, Doctor Visits  Doctor Visits Discussed/Reviewed Doctor Visits Discussed, Doctor Visits Reviewed, PCP  Exercise Interventions   Exercise Discussed/Reviewed Exercise Discussed, Exercise Reviewed, Physical Activity  Physical Activity Discussed/Reviewed Physical Activity Reviewed, Gym, Physical Activity Discussed  Education Interventions   Education Provided Provided Education, Provided Printed Education  Provided Verbal Education On Nutrition, When to see the doctor, Labs, Medication, Exercise  Labs Reviewed Lipid Profile, Hgb A1c, Kidney Function  Nutrition Interventions   Nutrition Discussed/Reviewed Nutrition Discussed, Nutrition Reviewed, Carbohydrate meal planning, Adding fruits and vegetables, Portion sizes  Pharmacy Interventions   Pharmacy Dicussed/Reviewed Pharmacy Topics Discussed, Pharmacy Topics Reviewed, Medications and their functions          SDOH assessments and interventions completed:  No     Care Coordination Interventions:  Yes, provided   Follow up plan: Follow up call scheduled for 01/06/23 @12  PM    Encounter Outcome:  Pt. Visit Completed

## 2022-09-07 NOTE — Patient Instructions (Signed)
Visit Information  Thank you for taking time to visit with me today. Please don't hesitate to contact me if I can be of assistance to you.   Following are the goals we discussed today:   Goals Addressed               This Visit's Progress     Patient Stated     To lower A1c <7.0% (pt-stated)        Care Coordination Interventions: Review of patient status, including review of consultant's reports, relevant laboratory and other test results, and medications completed Counseled on Diabetic diet, my plate method, 540 minutes of moderate intensity exercise/week Mailed printed educational materials related to the Diabetic Diet  Assessed patient's understanding of A1c goal: <7% Lab Results  Component Value Date   HGBA1C 7.3 (H) 09/02/2022        Other     To lower LDL <70        Care Coordination Interventions: Provider established cholesterol goals reviewed Review of patient status, including review of consultant's reports, relevant laboratory and other test results, and medications completed Reviewed role and benefits of statin for ASCVD risk reduction Reviewed importance of limiting foods high in cholesterol Reviewed exercise goals and target of 150 minutes per week Mailed printed educational materials related to Cholesterol Management  Lipid Panel     Component Value Date/Time   CHOL 163 09/02/2022 1239   TRIG 87 09/02/2022 1239   HDL 58 09/02/2022 1239   CHOLHDL 2.8 09/02/2022 1239   LDLCALC 89 09/02/2022 1239   LABVLDL 16 09/02/2022 1239           Our next appointment is by telephone on 01/06/23 at 12 PM  Please call the care guide team at [phone] if you need to cancel or reschedule your appointment.   If you are experiencing a Mental Health or Behavioral Health Crisis or need someone to talk to, please call 1-800-273-TALK (toll free, 24 hour hotline)  Patient verbalizes understanding of instructions and care plan provided today and agrees to view in  MyChart. Active MyChart status and patient understanding of how to access instructions and care plan via MyChart confirmed with patient.     Delsa Sale, RN, BSN, CCM Care Management Coordinator [facility] Care Management  Direct Phone: [phone]

## 2022-09-22 MED ORDER — EZETIMIBE 10 MG PO TABS
10.0000 mg | ORAL_TABLET | Freq: Every day | ORAL | 1 refills | Status: DC
Start: 2022-09-21 — End: 2023-05-06

## 2022-09-30 ENCOUNTER — Other Ambulatory Visit: Payer: Self-pay | Admitting: Nurse Practitioner

## 2022-09-30 DIAGNOSIS — Z794 Long term (current) use of insulin: Secondary | ICD-10-CM

## 2022-10-07 DIAGNOSIS — H04123 Dry eye syndrome of bilateral lacrimal glands: Secondary | ICD-10-CM | POA: Diagnosis not present

## 2022-10-07 DIAGNOSIS — E113312 Type 2 diabetes mellitus with moderate nonproliferative diabetic retinopathy with macular edema, left eye: Secondary | ICD-10-CM | POA: Diagnosis not present

## 2022-10-07 DIAGNOSIS — H26491 Other secondary cataract, right eye: Secondary | ICD-10-CM | POA: Diagnosis not present

## 2022-10-07 DIAGNOSIS — Z961 Presence of intraocular lens: Secondary | ICD-10-CM | POA: Diagnosis not present

## 2022-10-07 DIAGNOSIS — E113391 Type 2 diabetes mellitus with moderate nonproliferative diabetic retinopathy without macular edema, right eye: Secondary | ICD-10-CM | POA: Diagnosis not present

## 2022-10-07 DIAGNOSIS — H1045 Other chronic allergic conjunctivitis: Secondary | ICD-10-CM | POA: Diagnosis not present

## 2022-10-07 DIAGNOSIS — H35373 Puckering of macula, bilateral: Secondary | ICD-10-CM | POA: Diagnosis not present

## 2022-10-07 LAB — HM DIABETES EYE EXAM

## 2022-10-08 ENCOUNTER — Other Ambulatory Visit: Payer: Medicare Other | Admitting: Pharmacist

## 2022-10-08 MED ORDER — FREESTYLE LIBRE 3 SENSOR MISC: 3 refills | Status: AC

## 2022-10-08 MED ORDER — FREESTYLE LIBRE 3 READER DEVI: 1.0000 | Freq: Once | 0 refills | Status: AC

## 2022-10-08 NOTE — Progress Notes (Signed)
I have reviewed the pharmacist's encounter and agree with their documentation.   Catie Eppie Gibson, PharmD, BCACP, CPP [facility] [phone]

## 2022-10-08 NOTE — Progress Notes (Signed)
10/08/2022 Name: Jacqueline Bell MRN: [id number] DOB: 1950/08/10  No chief complaint on file.   Jacqueline Bell is a 72 y.o. year old female who presented for a telephone visit.   They were referred to the pharmacist by their PCP for assistance in managing diabetes.   Subjective:  Care Team: Primary Care Provider: Arnette Felts, FNP ; Next Scheduled Visit: 01/04/23  Medication Access/Adherence  Current Pharmacy:  [facility] - [place], [place] - [address] [place] [address] Phone: [phone] Fax: [phone]  [facility] [phone] - [place], [place] - [address] AT [facility] [address] [place] [address] Phone: [phone] Fax: [phone]  [facility] - [place], [place] - [address] [place] [address] Phone: [phone] Fax: [phone]   Patient reports affordability concerns with their medications: No  $70 for 90D supply of Rybelsus Patient reports access/transportation concerns to their pharmacy: No  Patient reports adherence concerns with their medications:  No    Diabetes:  Current medications: insulin aspart (Novolog) PRN (used once or twice last month), insulin degludec Jacqueline Bell) 14 units, Rybelsus (semagltuide) 14 mg daily, Farxiga (dapagliflozin) 10 mg daily > now switching to Jardiance (empagliflozin) 10 mg   Medications tried in the past: Ozempic (semaglutide) (medication shortages)  Using [employer] 3 CGM. Is using her cousin's meter, but would like to have her own. Has not received from the pharmacy. She has a new iphone 15 but could not get it to connect (in office with Lolita Cram and nursing staff). Feels like she would prefer the reader vs smart phone at this time.   Recent low blood sugars: 7/5 > 61 (6AM), 117 (9AM) 7/7 > 67 (6AM) 7/12 > 69 (6AM)  Last 7 days (CGM) > not yet connected to  LibreView 12AM > 127, 6AM > 121, 12PM > 141, 6PM > 148 (avg 134) TIR: 96% TAR: 4%   Has Freestyle Libre 3 from her cousin. Was supposed to get new meter. Not currently conn  Patient denies hypoglycemic s/sx, even when BG are <70 - but usually she is sleeping when she gets the low alarms. Treats with a couple oz of OJ, PB crackers/bread  Patient denies hyperglycemic symptoms including polyuria, polydipsia, polyphagia, nocturia, neuropathy, blurred vision (had eye appt yesterday).  [place]-5 miles on the elliptical Tue/Thurs   Currently has yeast infection 2 weeks > treating with probiotic, OTC suppository, cranberry juice  Hyperlipidemia/ASCVD Risk Reduction  Current lipid lowering medications: atorvastatin 20 mg, ezetimibe 10 mg  ASCVD History: none Family History:  Risk Factors: HTN, T2DM  PREVENT Risk Score: 10 year risk of CVD: 12.2% - 10 year risk of ASCVD: 7.2% - 10 year risk of HF: 7.8%  Objective:  Lab Results  Component Value Date   HGBA1C 7.3 (H) 09/02/2022    Lab Results  Component Value Date   CREATININE 0.74 09/02/2022   BUN 10 09/02/2022   NA 142 09/02/2022   K 3.9 09/02/2022   CL 103 09/02/2022   CO2 24 09/02/2022    Lab Results  Component Value Date   CHOL 163 09/02/2022   HDL 58 09/02/2022   LDLCALC 89 09/02/2022   TRIG 87 09/02/2022   CHOLHDL 2.8 09/02/2022    Medications Reviewed Today   Medications were not reviewed in this encounter  Assessment/Plan:   Diabetes: - Currently uncontrolled slightly above goal <7%. Pt reports some hypoglycemia episodes over the past month, however no hypoglycemia over the past 7D per self-reported CGM data - basal insulin dose remains appropriate. Will contact pharmacy ([facility]) to facilitate delivery of Freestyle [employer] 3 reader. Pt doing well on current medications, but does report yeast infection over the past two weeks which could potentially be exacerbated by  SGLT2i. Instructed pt to inform providers of recurrent or worsening yeast infections. Given that pt is not using insulin aspart (Novolg), consider discontinuing prescription to decrease risk of hypoglycemia with intermittent use of sliding scale. Consider switching back to Ozempic (semaglutide) for additional BG control in the future, as supply issues are currently resolved. - Reviewed long term cardiovascular and renal outcomes of uncontrolled blood sugar - Reviewed goal A1c, goal fasting, and goal 2 hour post prandial glucose - Reviewed lifestyle modifications including: maintaining physical activity - Recommend to discontinue insulin aspart (Novolog)  - Recommend to continue using Freestyle Libre 3 to monitor BG. - Recommend reaching out to PCP to inquire about tx for yeast infection - Sent Rx for [employer] 3 reader device to [facility]. Will follow-up on access  HLD: LDL 89 mg/dL currently uncontrolled above goal <70 mg/dL on moderate-intensity statin. Discussed reason for addition of ezetimibe at last PCP appointment - pt agreeable to start taking medication. Can consider escalating to high-intensity statin (atorvastatin 40 mg) if LDL remains >70 mg/dL after repeat lipid panel in 4-12 weeks. - Agree with plan to start ezetimibe 10 mg daily.  Follow Up Plan: Pharmacist call 6 weeks 11/26/22. PCP 01/04/23  Nils Pyle, PharmD PGY1 Pharmacy Resident

## 2022-10-08 NOTE — Patient Instructions (Signed)
It was nice talking to you today!  We will follow-up with you about obtaining a Freestyle Libre 3 Reader. In the meantime, continue the great work managing your diabetes! Please let us know if you start to have more frequent low blood sugars less than 70 mg/dL.   Thank you!

## 2022-10-09 ENCOUNTER — Other Ambulatory Visit: Payer: Medicare Other | Admitting: Pharmacist

## 2022-10-28 ENCOUNTER — Ambulatory Visit (INDEPENDENT_AMBULATORY_CARE_PROVIDER_SITE_OTHER): Payer: Medicare Other

## 2022-10-28 VITALS — BP 122/60 | HR 83 | Temp 98.3°F | Ht 67.0 in | Wt 167.8 lb

## 2022-10-28 DIAGNOSIS — Z0000 Encounter for general adult medical examination without abnormal findings: Secondary | ICD-10-CM

## 2022-10-28 NOTE — Patient Instructions (Addendum)
[name] , Thank you for taking time to come for your Medicare Wellness Visit. I appreciate your ongoing commitment to your health goals. Please review the following plan we discussed and let me know if I can assist you in the future.   Referrals/Orders/Follow-Ups/Clinician Recommendations: none  This is a list of the screening recommended for you and due dates:  Health Maintenance  Topic Date Due   Zoster (Shingles) Vaccine (1 of 2) Never done   DTaP/Tdap/Td vaccine (2 - Tdap) 04/15/2003   COVID-19 Vaccine (5 - 2023-24 season) 11/07/2021   Complete foot exam   03/18/2022   Eye exam for diabetics  10/02/2022   Flu Shot  10/08/2022   Yearly kidney health urinalysis for diabetes  12/11/2022   Hemoglobin A1C  03/04/2023   Mammogram  07/06/2023   Yearly kidney function blood test for diabetes  09/02/2023   Medicare Annual Wellness Visit  10/28/2023   Colon Cancer Screening  09/18/2028   Pneumonia Vaccine  Completed   DEXA scan (bone density measurement)  Completed   Hepatitis C Screening  Completed   HPV Vaccine  Aged Out    Advanced directives: (Copy Requested) Please bring a copy of your health care power of attorney and living will to the office to be added to your chart at your convenience.  Next Medicare Annual Wellness Visit scheduled for next year: No, office will schedule appointment  Insert Preventive Care attachment Insert FALL PREVENTION attachment if needed

## 2022-10-28 NOTE — Progress Notes (Signed)
Subjective:   Jacqueline Bell is a 72 y.o. female who presents for Medicare Annual (Subsequent) preventive examination.  Visit Complete: In person    Review of Systems     Cardiac Risk Factors include: advanced age (>27men, >39 women);diabetes mellitus;dyslipidemia     Objective:    Today's Vitals   10/28/22 1127  BP: 122/60  Pulse: 83  Temp: 98.3 F (36.8 C)  TempSrc: Oral  SpO2: 97%  Weight: 167 lb 12.8 oz (76.1 kg)  Height: 5\' 7"  (1.702 m)   Body mass index is 26.28 kg/m.     10/28/2022   11:36 AM 10/22/2021   10:41 AM 09/25/2020   10:50 AM 05/17/2019   11:14 AM 05/05/2018   11:50 AM 10/05/2016   10:27 AM  Advanced Directives  Does Patient Have a Medical Advance Directive? Yes Yes Yes Yes No No  Type of Estate agent of [place];Living will Healthcare Power of [place];Living will Healthcare Power of [place];Living will Living will    Copy of Healthcare Power of Attorney in Chart?  No - copy requested No - copy requested     Would patient like information on creating a medical advance directive?     Yes (MAU/Ambulatory/Procedural Areas - Information given)     Current Medications (verified) Outpatient Encounter Medications as of 10/28/2022  Medication Sig   atorvastatin (LIPITOR) 20 MG tablet Take 1 tablet (20 mg total) by mouth daily.   benzonatate (TESSALON PERLES) 100 MG capsule Take 1 capsule (100 mg total) by mouth every 6 (six) hours as needed.   cholecalciferol (VITAMIN D) 1000 units tablet Take 1,000 Units by mouth daily.   Continuous Glucose Sensor (FREESTYLE LIBRE 3 SENSOR) MISC Place 1 sensor on the skin every 14 days. Use to check glucose continuously   empagliflozin (JARDIANCE) 10 MG TABS tablet Take 1 tablet (10 mg total) by mouth daily before breakfast.   EPINEPHrine 0.3 mg/0.3 mL IJ SOAJ injection    ezetimibe (ZETIA) 10 MG tablet Take 1 tablet (10 mg total) by mouth daily.   fluticasone (FLONASE) 50 MCG/ACT nasal spray SHAKE  LIQUID AND USE 1 SPRAY IN EACH NOSTRIL DAILY   insulin aspart (NOVOLOG FLEXPEN) 100 UNIT/ML FlexPen Per SS: < 150 = 0; 150-199 = 3 units; 200-249 = 6 units; 250-299 = 9 units; 300-349= 12 units; >350 = 15 units   insulin degludec (TRESIBA FLEXTOUCH) 100 UNIT/ML FlexTouch Pen Patient is taking 14 units under the skin daily   loratadine (CLARITIN) 10 MG tablet TAKE 1 TABLET DAILY   magnesium oxide (MAG-OX) 400 MG tablet Take 400 mg by mouth daily.   meloxicam (MOBIC) 15 MG tablet TAKE 1 TABLET AS NEEDED   Multiple Vitamin (MULTIVITAMIN) tablet Take 1 tablet by mouth daily.   Olopatadine HCl (PAZEO) 0.7 % SOLN Place 1 drop into both eyes daily.   Omega-3 Fatty Acids (FISH OIL PO) Take 2 capsules by mouth daily.    RYBELSUS 14 MG TABS TAKE 1 TABLET DAILY   telmisartan (MICARDIS) 20 MG tablet Take 1 tablet (20 mg total) by mouth daily.   glucose blood test strip Use as instructed to check blood sugar 3 times a day. Dx code E11.65 (Patient not taking: Reported on 09/02/2022)   No facility-administered encounter medications on file as of 10/28/2022.    Allergies (verified) Iodine, Shellfish allergy, and Tramadol   History: Past Medical History:  Diagnosis Date   Diabetes mellitus without complication (HCC)    Mixed hyperlipidemia 12/04/2020  Positive self-administered antigen test for COVID-19 11/24/2021   History reviewed. No pertinent surgical history. History reviewed. No pertinent family history. Social History   Socioeconomic History   Marital status: Married    Spouse name: Not on file   Number of children: 4   Years of education: Not on file   Highest education level: Associate degree: academic program  Occupational History   Not on file  Tobacco Use   Smoking status: Never   Smokeless tobacco: Never  Vaping Use   Vaping status: Never Used  Substance and Sexual Activity   Alcohol use: No   Drug use: No   Sexual activity: Yes  Other Topics Concern   Not on file  Social  History Narrative   Patient reports no social determinants of health during today's screening. 3/12   Social Determinants of Health   Financial Resource Strain: Low Risk  (10/28/2022)   Overall Financial Resource Strain (CARDIA)    Difficulty of Paying Living Expenses: Not hard at all  Food Insecurity: No Food Insecurity (10/28/2022)   Hunger Vital Sign    Worried About Running Out of Food in the Last Year: Never true    Ran Out of Food in the Last Year: Never true  Transportation Needs: No Transportation Needs (10/28/2022)   PRAPARE - Administrator, Civil Service (Medical): No    Lack of Transportation (Non-Medical): No  Physical Activity: Sufficiently Active (10/28/2022)   Exercise Vital Sign    Days of Exercise per Week: 6 days    Minutes of Exercise per Session: 30 min  Stress: No Stress Concern Present (10/28/2022)   [employer] of Occupational Health - Occupational Stress Questionnaire    Feeling of Stress : Not at all  Social Connections: Socially Integrated (10/28/2022)   Social Connection and Isolation Panel [NHANES]    Frequency of Communication with Friends and Family: More than three times a week    Frequency of Social Gatherings with Friends and Family: Once a week    Attends Religious Services: More than 4 times per year    Active Member of [employer] or Organizations: Yes    Attends Engineer, structural: More than 4 times per year    Marital Status: Married    Tobacco Counseling Counseling given: Not Answered   Clinical Intake:  Pre-visit preparation completed: Yes  Pain : No/denies pain     Nutritional Status: BMI 25 -29 Overweight Nutritional Risks: None Diabetes: Yes CBG done?: No Did pt. bring in CBG monitor from home?: No  How often do you need to have someone help you when you read instructions, pamphlets, or other written materials from your doctor or pharmacy?: 1 - Never  Interpreter Needed?: No  Information entered by ::  NAllen LPN   Activities of Daily Living    10/28/2022   11:30 AM  In your present state of health, do you have any difficulty performing the following activities:  Hearing? 0  Vision? 0  Difficulty concentrating or making decisions? 0  Walking or climbing stairs? 0  Dressing or bathing? 0  Doing errands, shopping? 0  Preparing Food and eating ? N  Using the Toilet? N  In the past six months, have you accidently leaked urine? N  Do you have problems with loss of bowel control? N  Managing your Medications? N  Managing your Finances? N  Housekeeping or managing your Housekeeping? N    Patient Care Team: Arnette Felts, FNP as PCP -  General (General Practice) Sallye Lat, MD as Consulting Physician (Ophthalmology) Clarene Duke Karma Lew, RN as [facility] Alden Hipp, RPH-CPP (Pharmacist)  Indicate any recent Medical Services you may have received from other than [facility] providers in the past year (date may be approximate).     Assessment:   This is a routine wellness examination for [place].  Hearing/Vision screen Hearing Screening - Comments:: Denies hearing issues Vision Screening - Comments:: Regular eye exams, [place], [place]  Dietary issues and exercise activities discussed:     Goals Addressed             This Visit's Progress    Patient Stated       10/28/2022, wants to get of diabetes medicine       Depression Screen    10/28/2022   11:37 AM 09/02/2022   11:46 AM 04/29/2022   12:10 PM 10/22/2021   10:42 AM 09/25/2020   10:50 AM 05/23/2020   11:44 AM 05/17/2019   11:15 AM  PHQ 2/9 Scores  PHQ - 2 Score 0 0 0 0 0 0 0  PHQ- 9 Score 0 0     3    Fall Risk    10/28/2022   11:37 AM 09/02/2022   11:46 AM 04/29/2022   12:10 PM 12/10/2021   10:14 AM 10/22/2021   10:42 AM  Fall Risk   Falls in the past year? 0 0 0 0 0  Number falls in past yr: 0 0  0 0  Injury with Fall? 0 0  0 0  Risk for fall due to : Medication side  effect No Fall Risks  No Fall Risks Medication side effect  Follow up Falls prevention discussed;Falls evaluation completed Falls evaluation completed  Falls evaluation completed Falls evaluation completed;Education provided;Falls prevention discussed    MEDICARE RISK AT HOME: Medicare Risk at Home Any stairs in or around the home?: Yes If so, are there any without handrails?: No Home free of loose throw rugs in walkways, pet beds, electrical cords, etc?: Yes Adequate lighting in your home to reduce risk of falls?: Yes Life alert?: No Use of a cane, walker or w/c?: No Grab bars in the bathroom?: No Shower chair or bench in shower?: No Elevated toilet seat or a handicapped toilet?: No  TIMED UP AND GO:  Was the test performed?  Yes  Length of time to ambulate 10 feet: 5 sec Gait steady and fast without use of assistive device    Cognitive Function:        10/28/2022   11:38 AM 10/22/2021   10:43 AM 09/25/2020   10:51 AM 05/17/2019   11:19 AM 05/05/2018   11:54 AM  6CIT Screen  What Year? 0 points 0 points 0 points 0 points 0 points  What month? 0 points 0 points 0 points 0 points 0 points  What time? 0 points 0 points 0 points 0 points 0 points  Count back from 20 0 points 0 points 0 points 0 points 0 points  Months in reverse 0 points 2 points 0 points 0 points 0 points  Repeat phrase 2 points 4 points 4 points 2 points 0 points  Total Score 2 points 6 points 4 points 2 points 0 points    Immunizations Immunization History  Administered Date(s) Administered   Fluad Quad(high Dose 65+) 11/23/2019, 12/10/2021   IPV 08/28/1996   Influenza Whole 01/21/1998   Influenza, High Dose Seasonal PF 11/28/2018   Influenza-Unspecified 01/07/2001, 11/18/2017,  12/05/2020   PFIZER(Purple Top)SARS-COV-2 Vaccination 04/07/2019, 05/01/2019, 01/17/2020   PNEUMOCOCCAL CONJUGATE-20 08/28/2020   PPD Test 08/28/1996, 05/29/1998   Pfizer Covid-19 Vaccine Bivalent Booster 53yrs & up 01/24/2021    Td 04/14/1993   Typhoid Parenteral, AKD ([place] [employer]) 04/14/1993    TDAP status: Completed at today's visit  Flu Vaccine status: Due, Education has been provided regarding the importance of this vaccine. Advised may receive this vaccine at local pharmacy or Health Dept. Aware to provide a copy of the vaccination record if obtained from local pharmacy or Health Dept. Verbalized acceptance and understanding.  Pneumococcal vaccine status: Up to date  Covid-19 vaccine status: Completed vaccines  Qualifies for Shingles Vaccine? Yes   Zostavax completed No   Shingrix Completed?: No.    Education has been provided regarding the importance of this vaccine. Patient has been advised to call insurance company to determine out of pocket expense if they have not yet received this vaccine. Advised may also receive vaccine at local pharmacy or Health Dept. Verbalized acceptance and understanding.  Screening Tests Health Maintenance  Topic Date Due   Zoster Vaccines- Shingrix (1 of 2) Never done   DTaP/Tdap/Td (2 - Tdap) 04/15/2003   COVID-19 Vaccine (5 - 2023-24 season) 11/07/2021   FOOT EXAM  03/18/2022   OPHTHALMOLOGY EXAM  10/02/2022   INFLUENZA VACCINE  10/08/2022   Diabetic kidney evaluation - Urine ACR  12/11/2022   HEMOGLOBIN A1C  03/04/2023   MAMMOGRAM  07/06/2023   Diabetic kidney evaluation - eGFR measurement  09/02/2023   Medicare Annual Wellness (AWV)  10/28/2023   Colonoscopy  09/18/2028   Pneumonia Vaccine 36+ Years old  Completed   DEXA SCAN  Completed   Hepatitis C Screening  Completed   HPV VACCINES  Aged Out    Health Maintenance  Health Maintenance Due  Topic Date Due   Zoster Vaccines- Shingrix (1 of 2) Never done   DTaP/Tdap/Td (2 - Tdap) 04/15/2003   COVID-19 Vaccine (5 - 2023-24 season) 11/07/2021   FOOT EXAM  03/18/2022   OPHTHALMOLOGY EXAM  10/02/2022   INFLUENZA VACCINE  10/08/2022    Colorectal cancer screening: Type of screening: Colonoscopy. Completed  09/19/2018. Repeat every 10 years  Mammogram status: Completed 07/06/2022. Repeat every year  Bone Density status: Completed 10/26/2012.   Lung Cancer Screening: (Low Dose CT Chest recommended if Age 108-80 years, 20 pack-year currently smoking OR have quit w/in 15years.) does not qualify.   Lung Cancer Screening Referral: no  Additional Screening:  Hepatitis C Screening: does qualify; Completed 03/28/2012  Vision Screening: Recommended annual ophthalmology exams for early detection of glaucoma and other disorders of the eye. Is the patient up to date with their annual eye exam?  Yes  Who is the provider or what is the name of the office in which the patient attends annual eye exams? [facility] If pt is not established with a provider, would they like to be referred to a provider to establish care? No .   Dental Screening: Recommended annual dental exams for proper oral hygiene  Diabetic Foot Exam: Diabetic Foot Exam: Overdue, Pt has been advised about the importance in completing this exam. Pt is scheduled for diabetic foot exam on next appointment.  Community Resource Referral / Chronic Care Management: CRR required this visit?  No   CCM required this visit?  No     Plan:     I have personally reviewed and noted the following in the patient's chart:   Medical and  social history Use of alcohol, tobacco or illicit drugs  Current medications and supplements including opioid prescriptions. Patient is not currently taking opioid prescriptions. Functional ability and status Nutritional status Physical activity Advanced directives List of other physicians Hospitalizations, surgeries, and ER visits in previous 12 months Vitals Screenings to include cognitive, depression, and falls Referrals and appointments  In addition, I have reviewed and discussed with patient certain preventive protocols, quality metrics, and best practice recommendations. A written personalized care plan for  preventive services as well as general preventive health recommendations were provided to patient.     Barb Merino, LPN   06/15/8117   After Visit Summary: in person  Nurse Notes: none

## 2022-11-03 ENCOUNTER — Encounter: Payer: Self-pay | Admitting: Nurse Practitioner

## 2022-11-04 ENCOUNTER — Other Ambulatory Visit: Payer: Self-pay | Admitting: Nurse Practitioner

## 2022-11-04 DIAGNOSIS — I10 Essential (primary) hypertension: Secondary | ICD-10-CM

## 2022-11-04 DIAGNOSIS — E119 Type 2 diabetes mellitus without complications: Secondary | ICD-10-CM

## 2022-11-10 NOTE — Progress Notes (Signed)
Madelaine Bhat, CMA,acting as a Neurosurgeon for Arnette Felts, FNP.,have documented all relevant documentation on the behalf of Arnette Felts, FNP,as directed by  Arnette Felts, FNP while in the presence of Arnette Felts, FNP.  Subjective:  Patient ID: Jacqueline Bell , female    DOB: [birth year] , 72 y.o.   MRN: [id number]  Chief Complaint  Patient presents with   Vaginal Itching    HPI  Patient presents today for a possible yeast infection, patient reports having vaginal itch, and vaginal discomfort, started about 2 weeks ago worsened.  She tried an herbal medication for yeast but it did not work.  Patient also reports burning when urinating.  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 No pertinent family history.   Current Outpatient Medications:    atorvastatin (LIPITOR) 20 MG tablet, Take 1 tablet (20 mg total) by mouth daily., Disp: 90 tablet, Rfl: 1   cholecalciferol (VITAMIN D) 1000 units tablet, Take 5,000 Units by mouth daily., Disp: , Rfl:    Continuous Glucose Sensor (FREESTYLE LIBRE 3 SENSOR) MISC, Place 1 sensor on the skin every 14 days. Use to check glucose continuously, Disp: 6 each, Rfl: 3   EPINEPHrine 0.3 mg/0.3 mL IJ SOAJ injection, , Disp: , Rfl:    ezetimibe (ZETIA) 10 MG tablet, Take 1 tablet (10 mg total) by mouth daily., Disp: 90 tablet, Rfl: 1   fluticasone (FLONASE) 50 MCG/ACT nasal spray, SHAKE LIQUID AND USE 1 SPRAY IN EACH NOSTRIL DAILY, Disp: 16 g, Rfl: 2   insulin degludec (TRESIBA FLEXTOUCH) 100 UNIT/ML FlexTouch Pen, Patient is taking 14 units under the skin daily,  Disp: 15 mL, Rfl: 3   loratadine (CLARITIN) 10 MG tablet, TAKE 1 TABLET DAILY, Disp: 90 tablet, Rfl: 3   magnesium oxide (MAG-OX) 400 MG tablet, Take 400 mg by mouth daily., Disp: , Rfl:    meloxicam (MOBIC) 15 MG tablet, TAKE 1 TABLET AS NEEDED, Disp: 90 tablet, Rfl: 3   Multiple Vitamin (MULTIVITAMIN) tablet, Take 1 tablet by mouth daily., Disp: , Rfl:    Olopatadine HCl (PAZEO) 0.7 % SOLN, Place 1 drop into both eyes daily., Disp: 1 Bottle, Rfl: 5   Omega-3 Fatty Acids (FISH OIL PO), Take 2 capsules by mouth daily. , Disp: , Rfl:    RYBELSUS 14 MG TABS, TAKE 1 TABLET DAILY, Disp: 90 tablet, Rfl: 3   telmisartan (MICARDIS) 20 MG tablet, TAKE 1 TABLET DAILY, Disp: 90 tablet, Rfl: 3   Biotin 5000 MCG CAPS, Take 5,000 mcg by mouth daily., Disp: , Rfl:    empagliflozin (JARDIANCE) 10 MG TABS tablet, Take 1 tablet (10 mg total) by mouth daily before breakfast., Disp: 90 tablet, Rfl: 1   glucose blood test strip, Use as instructed to check blood sugar 3 times a day. Dx code E11.65 (Patient not taking: Reported on 09/02/2022), Disp: 100 each, Rfl: 12   Allergies  Allergen Reactions   Iodine Hives   Shellfish Allergy Hives   Tramadol Rash     Review of Systems  Constitutional: Negative.   HENT: Negative.  Eyes: Negative.   Respiratory: Negative.    Cardiovascular: Negative.   Gastrointestinal: Negative.   Endocrine: Negative.   Genitourinary: Negative.   Musculoskeletal: Negative.   Skin: Negative.   Allergic/Immunologic: Negative.   Neurological: Negative.   Hematological: Negative.   Psychiatric/Behavioral: Negative.       Today's Vitals   11/11/22 1216  BP: 100/60  Pulse: 93  Temp: 98.6 F (37 C)  TempSrc: Oral  Weight: 170 lb 3.2 oz (77.2 kg)  Height: 5\' 7"  (1.702 m)  PainSc: 0-No pain   Body mass index is 26.66 kg/m.  Wt Readings from Last 3 Encounters:  11/11/22 170 lb 3.2 oz (77.2 kg)  10/28/22 167 lb 12.8 oz (76.1 kg)  09/02/22 163 lb (73.9 kg)    The 10-year  ASCVD risk score (Arnett DK, et al., 2019) is: 14.4%   Values used to calculate the score:     Age: 72 years     Sex: Female     Is Non-Hispanic African American: Yes     Diabetic: Yes     Tobacco smoker: No     Systolic Blood Pressure: 100 mmHg     Is BP treated: Yes     HDL Cholesterol: 58 mg/dL     Total Cholesterol: 163 mg/dL  Objective:  Physical Exam Vitals reviewed.  Constitutional:      General: She is not in acute distress.    Appearance: Normal appearance. She is obese.  Cardiovascular:     Rate and Rhythm: Normal rate and regular rhythm.     Pulses: Normal pulses.     Heart sounds: Normal heart sounds. No murmur heard. Pulmonary:     Effort: Pulmonary effort is normal. No respiratory distress.     Breath sounds: Normal breath sounds. No wheezing.  Musculoskeletal:        General: No swelling or tenderness. Normal range of motion.     Right lower leg: No edema.     Left lower leg: No edema.  Skin:    General: Skin is warm and dry.     Capillary Refill: Capillary refill takes less than 2 seconds.  Neurological:     General: No focal deficit present.     Mental Status: She is alert and oriented to person, place, and time.     Cranial Nerves: No cranial nerve deficit.     Motor: No weakness.  Psychiatric:        Mood and Affect: Mood normal.        Behavior: Behavior normal.        Thought Content: Thought content normal.        Judgment: Judgment normal.         Assessment And Plan:  Vaginal itching Assessment & Plan: May be related to her medications however will be sending a sample to check for yeast. 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   Orders: -     Flu Vaccine Trivalent High Dose (Fluad)  Need for Tdap vaccination Assessment &  Plan: Transrx done, she has to have her tdap done at the the pharmacy, Rx sent to pharmacy  Orders: -     Tetanus-Diphth-Acell Pertussis; Inject 0.5 mLs into the muscle once for 1 dose.  Dispense: 0.5 mL; Refill: 0  Abnormal urine odor -     Urine Culture  Acute cystitis without hematuria Assessment & Plan: Positive urine for nitrates, will treat for UTI and send urine culture. Pending results will make changes to medications if deemed necessary  Orders: -     Nitrofurantoin Monohyd Macro; Take 1 capsule (100 mg total) by mouth 2 (two) times daily for 7 days.  Dispense: 14 capsule; Refill: 0    Return if symptoms worsen or fail to improve.  Patient was given opportunity to ask questions. Patient verbalized understanding of the plan and was able to repeat key elements of the plan. All questions were answered to their satisfaction.    Jeanell Sparrow, FNP, have reviewed all documentation for this visit. The documentation on 11/11/22 for the exam, diagnosis, procedures, and orders are all accurate and complete.   IF YOU HAVE BEEN REFERRED TO A SPECIALIST, IT MAY TAKE 1-2 WEEKS TO SCHEDULE/PROCESS THE REFERRAL. IF YOU HAVE NOT HEARD FROM US/SPECIALIST IN TWO WEEKS, PLEASE GIVE [place] A CALL AT [phone] X [phone].

## 2022-11-11 ENCOUNTER — Encounter: Payer: Self-pay | Admitting: Nurse Practitioner

## 2022-11-11 ENCOUNTER — Ambulatory Visit (INDEPENDENT_AMBULATORY_CARE_PROVIDER_SITE_OTHER): Payer: Medicare Other | Admitting: Nurse Practitioner

## 2022-11-11 ENCOUNTER — Other Ambulatory Visit (HOSPITAL_COMMUNITY)
Admission: RE | Admit: 2022-11-11 | Discharge: 2022-11-11 | Disposition: A | Discharge status: Home / Self Care | Payer: Medicare Other | Source: Ambulatory Visit | Attending: Nurse Practitioner | Admitting: Nurse Practitioner

## 2022-11-11 VITALS — BP 100/60 | HR 93 | Temp 98.6°F | Ht 67.0 in | Wt 170.2 lb

## 2022-11-11 DIAGNOSIS — N898 Other specified noninflammatory disorders of vagina: Secondary | ICD-10-CM | POA: Diagnosis not present

## 2022-11-11 DIAGNOSIS — Z23 Encounter for immunization: Secondary | ICD-10-CM | POA: Diagnosis not present

## 2022-11-11 DIAGNOSIS — Z794 Long term (current) use of insulin: Secondary | ICD-10-CM

## 2022-11-11 DIAGNOSIS — R8290 Unspecified abnormal findings in urine: Secondary | ICD-10-CM | POA: Diagnosis not present

## 2022-11-11 DIAGNOSIS — E113312 Type 2 diabetes mellitus with moderate nonproliferative diabetic retinopathy with macular edema, left eye: Secondary | ICD-10-CM | POA: Diagnosis not present

## 2022-11-11 DIAGNOSIS — N3000 Acute cystitis without hematuria: Secondary | ICD-10-CM

## 2022-11-11 DIAGNOSIS — I10 Essential (primary) hypertension: Secondary | ICD-10-CM

## 2022-11-11 LAB — POCT URINALYSIS DIPSTICK
Bilirubin, UA: NEGATIVE
Blood, UA: NEGATIVE
Glucose, UA: POSITIVE — AB
Ketones, UA: NEGATIVE
Leukocytes, UA: NEGATIVE
Nitrite, UA: POSITIVE
Protein, UA: NEGATIVE
Spec Grav, UA: 1.015 (ref 1.010–1.025)
Urobilinogen, UA: 0.2 U/dL
pH, UA: 6 (ref 5.0–8.0)

## 2022-11-11 MED ORDER — TETANUS-DIPHTH-ACELL PERTUSSIS 5-2.5-18.5 LF-MCG/0.5 IM SUSP
0.5000 mL | Freq: Once | INTRAMUSCULAR | 0 refills | Status: AC
Start: 2022-11-11 — End: 2022-11-11

## 2022-11-11 MED ORDER — NITROFURANTOIN MONOHYD MACRO 100 MG PO CAPS
100.0000 mg | ORAL_CAPSULE | Freq: Two times a day (BID) | ORAL | 0 refills | Status: AC
Start: 2022-11-11 — End: 2022-11-18

## 2022-11-11 MED ORDER — FLUCONAZOLE 100 MG PO TABS: 100.0000 mg | ORAL_TABLET | Freq: Every day | ORAL | 0 refills | Status: DC

## 2022-11-12 LAB — MICROALBUMIN / CREATININE URINE RATIO
Creatinine, Urine: 53 mg/dL
Microalb/Creat Ratio: 7 mg/g{creat} (ref 0–29)
Microalbumin, Urine: 3.7 ug/mL

## 2022-11-13 LAB — CERVICOVAGINAL ANCILLARY ONLY
Bacterial Vaginitis (gardnerella): NEGATIVE
Candida Glabrata: NEGATIVE
Candida Vaginitis: POSITIVE — AB
Comment: NEGATIVE
Comment: NEGATIVE
Comment: NEGATIVE

## 2022-11-16 ENCOUNTER — Other Ambulatory Visit: Payer: Self-pay | Admitting: Nurse Practitioner

## 2022-11-16 DIAGNOSIS — N3000 Acute cystitis without hematuria: Secondary | ICD-10-CM

## 2022-11-16 LAB — URINE CULTURE

## 2022-11-16 MED ORDER — AMOXICILLIN-POT CLAVULANATE 875-125 MG PO TABS
1.0000 | ORAL_TABLET | Freq: Two times a day (BID) | ORAL | 0 refills | Status: DC
Start: 2022-11-16 — End: 2022-11-26

## 2022-11-26 ENCOUNTER — Other Ambulatory Visit: Payer: Medicare Other | Admitting: Pharmacist

## 2022-11-26 NOTE — Progress Notes (Signed)
11/26/2022 Name: Jacqueline Bell MRN: [id number] DOB: 21-Jan-1951  No chief complaint on file.   Jacqueline Bell is a 72 y.o. year old female who presented for a telephone visit.   They were referred to the pharmacist by their PCP for assistance in managing diabetes.    Subjective:  Patient reports doing well today.  Care Team: Primary Care Provider: Arnette Felts, FNP ; Next Scheduled Visit: 01/04/2023  Medication Access/Adherence  Current Pharmacy:  [facility] - [place], [place] - [address] [place] [address] Phone: [phone] Fax: [phone]  [facility] [phone] - [place], [place] - [address] AT [facility] [address] [place] [address] Phone: [phone] Fax: [phone]  [facility] DELIVERY - [place], [place] - [address] [place] [address] Phone: [phone] Fax: [phone]  [facility] [phone] - [place],  - [address] AT [facility] OF [address] [place] [address] Phone: [phone] Fax: [phone]   Patient reports affordability concerns with their medications: No  Patient reports access/transportation concerns to their pharmacy: No  Patient reports adherence concerns with their medications:  No     Diabetes:  Current medications: Rybelsus 14 mg daily, Tresiba 14 units daily (takes [place] at varied times in AM or PM), Novolog SSI (only used in once in past two weeks, will only inject 2 units if she does use it) Medications tried in the past: Ozempic (switched to Rybelsus during Ozempic drug shortages)  Patient Reported CGM data from last 14 says: % Time CGM is active: 92% Average Glucose: 139 mg/dL Time in Goal:  - Time in range 70-180: 88% - Time above range: 12% - Time below range: 0%  Patient denies hypoglycemic s/sx including dizziness,  shakiness, sweating, but endorses 4-5 episodes of waking up in the middle of the night with BG 67-69. Drinks ensure and BG improves. She does not have any symptoms with these events. Patient denies hyperglycemic symptoms including polyuria, polydipsia, polyphagia, nocturia, neuropathy, blurred vision.   Hyperlipidemia/ASCVD Risk Reduction  Current lipid lowering medications: atorvastatin 20 mg daily, ezetimibe 10 mg daily  ASCVD History: none Risk Factors: HTN, T2DM   PREVENT Risk Score: 10 year risk of CVD: 12.2% - 10 year risk of ASCVD: 7.2% - 10 year risk of HF: 7.8%   Objective:  Lab Results  Component Value Date   HGBA1C 7.3 (H) 09/02/2022    Lab Results  Component Value Date   CREATININE 0.74 09/02/2022   BUN 10 09/02/2022   NA 142 09/02/2022   K 3.9 09/02/2022   CL 103 09/02/2022   CO2 24 09/02/2022    Lab Results  Component Value Date   CHOL 163 09/02/2022   HDL 58 09/02/2022   LDLCALC 89 09/02/2022   TRIG 87 09/02/2022   CHOLHDL 2.8 09/02/2022    Medications Reviewed Today   Medications were not reviewed in this encounter       Assessment/Plan:   Diabetes: - Currently controlled based on patient reported CGM data showing TIR 88% - Reviewed long term cardiovascular and renal outcomes of uncontrolled blood sugar - Reviewed goal A1c, goal fasting, and goal 2 hour post prandial glucose - Recommend to continue Rybelsus 14 mg daily. Discussed switching Rybelsus to Ozempic for better BG lowering with the goal of reducing or discontinuing insulin. Per  patient preference, will continue on Rybelsus. She does not want to lose any more weight than she already did when taking Ozempic previously. - Recommend to continue Tresiba 14 units daily and counseled on the importance of taking Tresiba at the same time every day. Discussed that [name] lasts for 24 hours and taking at varying times each day could be contributing to her hypoglycemia. - Will discontinue Novolog  SSI as patient has been rarely using and BG is mostly within goal with some episodes of hypoglycemia - Recommend to check glucose continuously with Freestyle Libre CGM - A1c due at PCP f/u in October     Hyperlipidemia/ASCVD Risk Reduction: - Currently uncontrolled based on LDL of 89 in June, above goal <70 mg/dl. Likely at or close to goal with the addition of ezetimibe since that time. Recommend to check updated lipids at next PCP visit in October. If still elevated, would recommend increasing atorvastatin to high intensity 40 mg daily.    Follow Up Plan: telephone visit in 4 weeks  Jarrett Ables, PharmD PGY-1 Pharmacy Resident

## 2022-11-26 NOTE — Patient Instructions (Addendum)
It was great talking with you today, Jacqueline Bell!  Please discontinue the Novolog sliding scale insulin.  Please take Tresiba 14 units every day in the morning. It is important for you take this around the same time every day to avoid episodes of low blood sugar.  Continue taking Rybelsus 14 mg daily, atorvastatin 20 mg daily, and ezetimibe 10 mg daily.  We will follow up with you on 10/17 at 10:30 AM for a telephone visit and your next primary care provider visit is on 10/28 at 12 noon.  Please let us know if you have any questions or concerns or if you continue to experience low blood sugars!

## 2022-11-29 DIAGNOSIS — R8290 Unspecified abnormal findings in urine: Secondary | ICD-10-CM | POA: Insufficient documentation

## 2022-11-29 DIAGNOSIS — Z794 Long term (current) use of insulin: Secondary | ICD-10-CM | POA: Insufficient documentation

## 2022-11-29 DIAGNOSIS — N3000 Acute cystitis without hematuria: Secondary | ICD-10-CM | POA: Insufficient documentation

## 2022-11-29 DIAGNOSIS — Z23 Encounter for immunization: Secondary | ICD-10-CM | POA: Insufficient documentation

## 2022-11-29 DIAGNOSIS — N898 Other specified noninflammatory disorders of vagina: Secondary | ICD-10-CM | POA: Insufficient documentation

## 2022-11-29 NOTE — Assessment & Plan Note (Signed)
Due for her microalbumin check

## 2022-11-29 NOTE — Assessment & Plan Note (Signed)
Influenza vaccine administered Encouraged to take Tylenol as needed for fever or muscle aches.

## 2022-11-29 NOTE — Assessment & Plan Note (Signed)
Transrx done, she has to have her tdap done at the the pharmacy, Rx sent to pharmacy

## 2022-11-29 NOTE — Assessment & Plan Note (Signed)
Positive urine for nitrates, will treat for UTI and send urine culture. Pending results will make changes to medications if deemed necessary

## 2022-11-29 NOTE — Assessment & Plan Note (Signed)
May be related to her medications however will be sending a sample to check for yeast. Will treat with diflucan

## 2022-12-01 ENCOUNTER — Other Ambulatory Visit: Payer: Self-pay | Admitting: Nurse Practitioner

## 2022-12-01 DIAGNOSIS — E782 Mixed hyperlipidemia: Secondary | ICD-10-CM

## 2022-12-23 ENCOUNTER — Other Ambulatory Visit (INDEPENDENT_AMBULATORY_CARE_PROVIDER_SITE_OTHER): Payer: Medicare Other | Admitting: Pharmacist

## 2022-12-23 DIAGNOSIS — Z794 Long term (current) use of insulin: Secondary | ICD-10-CM

## 2022-12-23 DIAGNOSIS — I10 Essential (primary) hypertension: Secondary | ICD-10-CM

## 2022-12-23 DIAGNOSIS — E113312 Type 2 diabetes mellitus with moderate nonproliferative diabetic retinopathy with macular edema, left eye: Secondary | ICD-10-CM

## 2022-12-23 MED ORDER — TRESIBA FLEXTOUCH 100 UNIT/ML ~~LOC~~ SOPN: 10.0000 [IU] | PEN_INJECTOR | Freq: Every day | SUBCUTANEOUS | 1 refills | Status: DC

## 2022-12-23 MED ORDER — EMPAGLIFLOZIN 25 MG PO TABS: 25.0000 mg | ORAL_TABLET | Freq: Every day | ORAL | 3 refills | Status: DC

## 2022-12-23 NOTE — Progress Notes (Signed)
12/23/2022 Name: Jacqueline Bell MRN: [id number] DOB: [birth year]  Chief Complaint  Patient presents with   Medication Management   Diabetes   Hypertension    Jacqueline Bell is a 72 y.o. year old female who presented for a telephone visit.   They were referred to the pharmacist by their PCP for assistance in managing diabetes, hypertension, and hyperlipidemia.    Subjective:  Care Team: Primary Care Provider: Arnette Felts, FNP ; Next Scheduled Visit: 01/04/23  Medication Access/Adherence  Current Pharmacy:  [facility] - [place], [place] - [address] [place] [address] Phone: [phone] Fax: [phone]  [facility] [phone] - [place], [place] - [address] AT [facility] [address] [place] [address] Phone: [phone] Fax: [phone]  [facility] DELIV[facility] - [place], [place] - [address] [place] [address] Phone: [phone] Fax: [phone]  [facility] [phone] - [place], [place] - [address] AT [facility] [address] [place] [address] Phone: [phone] Fax: [phone]   Patient reports affordability concerns with their medications: No  Patient reports access/transportation concerns to their pharmacy: No  Patient reports adherence concerns with their medications:  No    Diabetes:  Current medications: Jardiance 10 mg daily, Rybelsus 14 mg daily, Tresiba 14 units daily  Medications tried in the past: reports a prior history of metformin, including metformin XR - severe stomach upset   She does not feel like she has been having more yeast infections since starting SGLT2 therapy.   Past 7 days Average Glucose: 136 mg/dL - 854; 627; 035; 009 Time in Goal:  - Time in range 70-180: 93% - Time above range: 7% - Time below range: 0%  Patient reports hypoglycemic  alarms for decreasing sugar, but has not had readings <70  Current medication access support: [facility], no concerns with cost  Hypertension:  Current medications: telmisartan 20 mg daily  Hyperlipidemia/ASCVD Risk Reduction  Current lipid lowering medications: atorvastatin 20 mg daily, ezetimibe 10 mg daily    Objective:  Lab Results  Component Value Date   HGBA1C 7.3 (H) 09/02/2022    Lab Results  Component Value Date   CREATININE 0.74 09/02/2022   BUN 10 09/02/2022   NA 142 09/02/2022   K 3.9 09/02/2022   CL 103 09/02/2022   CO2 24 09/02/2022    Lab Results  Component Value Date   CHOL 163 09/02/2022   HDL 58 09/02/2022   LDLCALC 89 09/02/2022   TRIG 87 09/02/2022   CHOLHDL 2.8 09/02/2022    Medications Reviewed Today     Reviewed by Alden Hipp, RPH-CPP (Pharmacist) on 12/23/22 at 1114  Med List Status: <None>   Medication Order Taking? Sig Documenting Provider Last Dose Status Informant  atorvastatin (LIPITOR) 20 MG tablet [id number] Yes TAKE 1 TABLET DAILY Arnette Felts, FNP Taking Active   Biotin 5000 MCG CAPS [id number] Yes Take 5,000 mcg by mouth daily. [provider] Taking Active   cholecalciferol (VITAMIN D) 1000 units tablet [id number] Yes Take 5,000 Units by mouth daily. [provider] Taking Active   Continuous Glucose Sensor (FREESTYLE LIBRE 3 SENSOR) [place] [id number] Yes Place 1 sensor on the skin every 14 days. Use to check glucose continuously Arnette Felts, FNP Taking Active   empagliflozin (JARDIANCE) 10 MG TABS tablet [id number] Yes Take  1 tablet (10 mg total) by mouth daily before breakfast. Arnette Felts, FNP Taking Active            Med Note Clearance Coots, Janace Litten   Wed Dec 23, 2022 11:12 AM)    EPINEPHrine 0.3 mg/0.3 mL IJ SOAJ injection [id number]   [provider]  Active   ezetimibe (ZETIA) 10 MG tablet [id number] Yes Take 1 tablet (10 mg total) by mouth daily. Arnette Felts, FNP Taking Active   fluticasone  [facility]) 50 MCG/ACT nasal spray [id number] Yes SHAKE LIQUID AND USE 1 SPRAY IN EACH NOSTRIL DAILY Arnette Felts, FNP Taking Active   glucose blood test strip [id number] Yes Use as instructed to check blood sugar 3 times a day. Dx code E11.65 Arnette Felts, FNP Taking Active   insulin degludec (TRESIBA FLEXTOUCH) 100 UNIT/ML FlexTouch Pen [id number] Yes Patient is taking 14 units under the skin daily Arnette Felts, FNP Taking Active   loratadine (CLARITIN) 10 MG tablet [id number] Yes TAKE 1 TABLET DAILY Arnette Felts, FNP Taking Active   magnesium oxide (MAG-OX) 400 MG tablet [id number] Yes Take 400 mg by mouth daily. [provider] Taking Active Self  meloxicam (MOBIC) 15 MG tablet [id number] Yes TAKE 1 TABLET AS NEEDED Arnette Felts, FNP Taking Active            Med Note Caryn Section Nov 26, 2022 10:15 AM) Taking PRN  Multiple Vitamin (MULTIVITAMIN) tablet [id number] Yes Take 1 tablet by mouth daily. [provider] Taking Active   Olopatadine HCl (PAZEO) 0.7 % SOLN [id number] Yes Place 1 drop into both eyes daily. Bobbitt, Heywood Iles, MD Taking Active            Med Note Clearance Coots, Desma Paganini Dec 23, 2022 11:13 AM)    Omega-3 Fatty Acids (FISH OIL PO) [id number] Yes Take 2 capsules by mouth daily.  [provider] Taking Active   RYBELSUS 14 MG TABS [id number] Yes TAKE 1 TABLET DAILY Arnette Felts, FNP Taking Active   telmisartan (MICARDIS) 20 MG tablet [id number] Yes TAKE 1 TABLET DAILY Arnette Felts, FNP Taking Active              Assessment/Plan:   Diabetes: - Currently controlled per CGM readings - Reviewed long term cardiovascular and renal outcomes of uncontrolled blood sugar - Reviewed goal A1c, goal fasting, and goal 2 hour post prandial glucose - Recommend to increase Jardiance to 25 mg daily. Discussed risk of genitourinary infections. Patient will monitor and let us know if increase in frequency of yeast infections moving forward. Decrease  Tresiba to 10 units daily to reduce risk of hypoglycemia. Discussed goals of eventual insulin elimination. Discussed with PCP, she is in agreement.  - Recommend to check glucose using CGM.   Hypertension: - Currently controlled - Recommend to continue current regimen at this time   Hyperlipidemia/ASCVD Risk Reduction: - Currently uncontrolled but anticipate improvement with addition of ezetimibe.  - Recommend to continue current regimen, recheck lipids at upcoming PCP appointment.    Follow Up Plan: PCP in 2 weeks, PharmD in ~ 8   Catie TClearance Coots, PharmD, BCACP, CPP Clinical Pharmacist [facility] [phone]

## 2022-12-23 NOTE — Patient Instructions (Signed)
Ms. Lukach,   It was great talking to you today!  Increase Jardiance to 25 mg daily. I [facility] an order to [facility] - in the meantime, you can take 2 of the Jardiance 10 mg tablets daily. Decrease Tresiba to 10 units daily to reduce the risk of low blood sugars. Continue Rybelsus 14 mg daily.   Our goal is to maintain your Time in Range of at least 70%.   Our goal LDL is less than 70. We will check your cholesterol when you are here in the office later this month.   Please reach out with any questions!  Catie Eppie Gibson, PharmD, BCACP, CPP Clinical Pharmacist [facility] [phone]

## 2022-12-24 ENCOUNTER — Other Ambulatory Visit: Payer: Medicare Other | Admitting: Pharmacist

## 2023-01-04 ENCOUNTER — Ambulatory Visit: Payer: Medicare Other | Admitting: Nurse Practitioner

## 2023-01-06 ENCOUNTER — Ambulatory Visit: Payer: Self-pay

## 2023-01-06 NOTE — Patient Outreach (Signed)
  Care Coordination   Follow Up Visit Note   01/06/2023 Name: DEDRIA LAFEVER MRN: [id number] DOB: 1950-05-14  CAYLIANA MAGNESS is a 72 y.o. year old female who sees Arnette Felts, FNP for primary care. I spoke with  [name] by phone today.  What matters to the patients health and wellness today?  Patient would like to keep her in person visit with PCP as scheduled to check on her progress with chronic health conditions.     Goals Addressed             This Visit's Progress    RN Care Coordination Activities: further follow up needed       Care Coordination Interventions: Determined patient's PCP rescheduled her follow up visit, her new is appointment is scheduled for 01/07/23 Determined patient is agreeable to a nurse care coordination follow up call after her PCP visit in order to discuss her progress with chronic disease management  Discussed plans with patient for ongoing care coordination follow up and provided patient with direct contact information for nurse care coordinator     Interventions Today    Flowsheet Row Most Recent Value  General Interventions   General Interventions Discussed/Reviewed Doctor Visits  Doctor Visits Discussed/Reviewed Doctor Visits Reviewed, Doctor Visits Discussed, PCP          SDOH assessments and interventions completed:  No     Care Coordination Interventions:  Yes, provided   Follow up plan: Follow up call scheduled for 01/11/23 @12 :00 PM     Encounter Outcome:  Patient Visit Completed

## 2023-01-06 NOTE — Patient Instructions (Signed)
Visit Information  Thank you for taking time to visit with me today. Please don't hesitate to contact me if I can be of assistance to you.   Following are the goals we discussed today:   Goals Addressed             This Visit's Progress    RN Care Coordination Activities: further follow up needed       Care Coordination Interventions: Determined patient's PCP rescheduled her follow up visit, her new is appointment is scheduled for 01/07/23 Determined patient is agreeable to a nurse care coordination follow up call after her PCP visit in order to discuss her progress with chronic disease management  Discussed plans with patient for ongoing care coordination follow up and provided patient with direct contact information for nurse care coordinator        Our next appointment is by telephone on 01/11/23 at 12:00 PM  Please call the care guide team at [phone] if you need to cancel or reschedule your appointment.   If you are experiencing a Mental Health or Behavioral Health Crisis or need someone to talk to, please call 1-800-273-TALK (toll free, 24 hour hotline)  Patient verbalizes understanding of instructions and care plan provided today and agrees to view in MyChart. Active MyChart status and patient understanding of how to access instructions and care plan via MyChart confirmed with patient.     Delsa Sale RN BSN CCM [place]  [facility] [place], [facility] Nurse Care Coordinator  Direct Dial: [phone] Website: [email or web address]

## 2023-01-07 ENCOUNTER — Ambulatory Visit (INDEPENDENT_AMBULATORY_CARE_PROVIDER_SITE_OTHER): Payer: Medicare Other | Admitting: Nurse Practitioner

## 2023-01-07 VITALS — BP 120/70 | HR 98 | Temp 98.4°F | Ht 67.0 in | Wt 167.6 lb

## 2023-01-07 DIAGNOSIS — E782 Mixed hyperlipidemia: Secondary | ICD-10-CM

## 2023-01-07 DIAGNOSIS — Z794 Long term (current) use of insulin: Secondary | ICD-10-CM | POA: Diagnosis not present

## 2023-01-07 DIAGNOSIS — I10 Essential (primary) hypertension: Secondary | ICD-10-CM | POA: Diagnosis not present

## 2023-01-07 DIAGNOSIS — E113312 Type 2 diabetes mellitus with moderate nonproliferative diabetic retinopathy with macular edema, left eye: Secondary | ICD-10-CM | POA: Diagnosis not present

## 2023-01-07 DIAGNOSIS — N898 Other specified noninflammatory disorders of vagina: Secondary | ICD-10-CM

## 2023-01-07 MED ORDER — FLUCONAZOLE 100 MG PO TABS: 100.0000 mg | ORAL_TABLET | Freq: Every day | ORAL | 0 refills | Status: DC

## 2023-01-07 NOTE — Progress Notes (Signed)
Madelaine Bhat, CMA,acting as a Neurosurgeon for Arnette Felts, FNP.,have documented all relevant documentation on the behalf of Arnette Felts, FNP,as directed by  Arnette Felts, FNP while in the presence of Arnette Felts, FNP.  Subjective:  Patient ID: Jacqueline Bell , female    DOB: [birth year] , 72 y.o.   MRN: [id number]  Chief Complaint  Patient presents with   Hypertension    HPI  Patient presents today for a bp and dm follow up, Patient reports compliance with medication. Patient denies any chest pain, SOB, or headaches. Patient reports she thinks she has another yeast infection- she reports vaginal itch.   BP Readings from Last 3 Encounters: 01/07/23 : 120/70 11/11/22 : 100/60 10/28/22 : 122/60    Diabetes She presents for her follow-up diabetic visit. She has type 2 diabetes mellitus. Her disease course has been stable. Pertinent negatives for hypoglycemia include no confusion, dizziness or headaches. There are no diabetic associated symptoms. Pertinent negatives for diabetes include no chest pain, no fatigue, no polydipsia, no polyphagia, no polyuria and no weakness. There are no hypoglycemic complications. Symptoms are stable. Diabetic complications include retinopathy. Risk factors for coronary artery disease include sedentary lifestyle, diabetes mellitus and dyslipidemia. Current diabetic treatment includes oral agent (dual therapy). She is compliant with treatment all of the time. Her weight is stable. She is following a generally healthy diet. When asked about meal planning, she reported none. She has not had a previous visit with a dietitian. She rarely participates in exercise. There is no change in her home blood glucose trend. (Blood sugar readings average last 90 days 139) An ACE inhibitor/angiotensin II receptor blocker is being taken. She does not see a podiatrist.Eye exam is current (11/11/2022).  Hypertension This is a chronic problem. The current episode started more than 1 year  ago. The problem is unchanged. The problem is uncontrolled. Pertinent negatives include no anxiety, chest pain, headaches, palpitations or shortness of breath. Risk factors for coronary artery disease include obesity, sedentary lifestyle and diabetes mellitus. Past treatments include ACE inhibitors. The current treatment provides mild improvement. There are no compliance problems.  Hypertensive end-organ damage includes retinopathy. There is no history of angina or kidney disease. There is no history of chronic renal disease.     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 No pertinent family history.   Current Outpatient Medications:    atorvastatin (LIPITOR) 20 MG tablet, TAKE 1 TABLET DAILY, Disp: 90 tablet, Rfl: 3   Biotin 5000 MCG CAPS, Take 5,000 mcg by mouth daily., Disp: , Rfl:    cholecalciferol (VITAMIN D) 1000 units tablet, Take 5,000 Units by mouth daily., Disp: , Rfl:    Continuous Glucose Sensor (FREESTYLE LIBRE 3 SENSOR) MISC, Place 1 sensor on the skin every 14 days. Use to check glucose continuously, Disp: 6 each, Rfl: 3   empagliflozin (JARDIANCE) 25 MG TABS tablet, Take 1 tablet (25 mg total) by mouth daily before breakfast., Disp: 90 tablet, Rfl: 3   EPINEPHrine 0.3 mg/0.3 mL IJ SOAJ injection, , Disp: , Rfl:    ezetimibe (ZETIA) 10 MG tablet, Take 1 tablet (10 mg total) by mouth daily., Disp: 90 tablet, Rfl: 1   fluconazole (DIFLUCAN) 100 MG tablet, Take  1 tablet (100 mg total) by mouth daily. Take 1 tablet by mouth now repeat in 5 days, Disp: 2 tablet, Rfl: 0   fluticasone (FLONASE) 50 MCG/ACT nasal spray, SHAKE LIQUID AND USE 1 SPRAY IN EACH NOSTRIL DAILY, Disp: 16 g, Rfl: 2   glucose blood test strip, Use as  instructed to check blood sugar 3 times a day. Dx code E11.65, Disp: 100 each, Rfl: 12   insulin degludec (TRESIBA FLEXTOUCH) 100 UNIT/ML FlexTouch Pen, Inject 10 Units into the skin daily., Disp: 15 mL, Rfl: 1   loratadine (CLARITIN) 10 MG tablet, TAKE 1 TABLET DAILY, Disp: 90 tablet, Rfl: 3   magnesium oxide (MAG-OX) 400 MG tablet, Take 400 mg by mouth daily., Disp: , Rfl:    meloxicam (MOBIC) 15 MG tablet, TAKE 1 TABLET AS NEEDED, Disp: 90 tablet, Rfl: 3   Multiple Vitamin (MULTIVITAMIN) tablet, Take 1 tablet by mouth daily., Disp: , Rfl:    Olopatadine HCl (PAZEO) 0.7 % SOLN, Place 1 drop into both eyes daily., Disp: 1 Bottle, Rfl: 5   Omega-3 Fatty Acids (FISH OIL PO), Take 2 capsules by mouth daily. , Disp: , Rfl:    RYBELSUS 14 MG TABS, TAKE 1 TABLET DAILY, Disp: 90 tablet, Rfl: 3   telmisartan (MICARDIS) 20 MG tablet, TAKE 1 TABLET DAILY, Disp: 90 tablet, Rfl: 3   Allergies  Allergen Reactions   Iodine Hives   Shellfish Allergy Hives   Tramadol Rash     Review of Systems  Constitutional: Negative.  Negative for fatigue.  HENT: Negative.    Eyes: Negative.   Respiratory: Negative.  Negative for shortness of breath.   Cardiovascular: Negative.  Negative for chest pain and palpitations.  Gastrointestinal: Negative.   Endocrine: Negative for polydipsia, polyphagia and polyuria.  Neurological: Negative.  Negative for dizziness, weakness and headaches.  Psychiatric/Behavioral: Negative.  Negative for confusion.      Today's Vitals   01/07/23 1601  BP: 120/70  Pulse: 98  Temp: 98.4 F (36.9 C)  TempSrc: Oral  Weight: 167 lb 9.6 oz (76 kg)  Height: 5\' 7"  (1.702 m)  PainSc: 0-No pain   Body mass index is 26.25 kg/m.  Wt Readings from Last 3 Encounters:  01/07/23 167 lb 9.6 oz (76 kg)  11/11/22 170 lb 3.2 oz (77.2 kg)  10/28/22 167 lb 12.8 oz (76.1 kg)     Objective:  Physical Exam Vitals reviewed.  Constitutional:      General: She is not in acute distress.     Appearance: Normal appearance.  Cardiovascular:     Rate and Rhythm: Normal rate and regular rhythm.     Pulses: Normal pulses.     Heart sounds: Normal heart sounds. No murmur heard. Pulmonary:     Effort: Pulmonary effort is normal. No respiratory distress.     Breath sounds: Normal breath sounds. No wheezing.  Musculoskeletal:        General: No swelling or tenderness. Normal range of motion.     Right lower leg: No edema.     Left lower leg: No edema.  Skin:    General: Skin is warm and dry.     Capillary Refill: Capillary refill takes less than 2 seconds.     Coloration: Skin is not jaundiced.     Findings: No bruising.  Neurological:     General: No focal deficit present.     Mental Status: She is alert and oriented to person, place, and time.  Cranial Nerves: No cranial nerve deficit.     Motor: No weakness.  Psychiatric:        Mood and Affect: Mood normal.        Behavior: Behavior normal.        Thought Content: Thought content normal.        Judgment: Judgment normal.         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  Her Jardiance was increased between the last visit we will recheck levels today.  Blood sugars have been averaging 141 over the last 90 days  Orders: -     Basic metabolic panel -     Hemoglobin A1c  Essential hypertension Assessment & Plan: Blood pressure is well control, continue current medications.    Mixed hyperlipidemia Assessment & Plan: Chronic, stable. Continue current medications  Orders: -     Lipid panel  Vaginal itching Assessment & Plan: For some reason continues to have vaginal itching.  She is taking Jardiance currently.  I have encouraged her to increase her water intake especially when she takes her Jardiance.  Will go ahead and treat  for yeast infection and we will send her vaginal swab to check for yeast.  Orders: -     NuSwab Vaginitis Plus (VG+) -     Fluconazole; Take 1 tablet (100 mg total) by mouth daily. Take 1 tablet by mouth now repeat in 5 days  Dispense: 2 tablet; Refill: 0    Return for controlled DM check 4 months.  Patient was given opportunity to ask questions. Patient verbalized understanding of the plan and was able to repeat key elements of the plan. All questions were answered to their satisfaction.    Jeanell Sparrow, FNP, have reviewed all documentation for this visit. The documentation on 01/07/23 for the exam, diagnosis, procedures, and orders are all accurate and complete.   IF YOU HAVE BEEN REFERRED TO A SPECIALIST, IT MAY TAKE 1-2 WEEKS TO SCHEDULE/PROCESS THE REFERRAL. IF YOU HAVE NOT HEARD FROM US/SPECIALIST IN TWO WEEKS, PLEASE GIVE [place] A CALL AT [phone].

## 2023-01-07 NOTE — Assessment & Plan Note (Signed)
Chronic, stable.  Continue current medications.

## 2023-01-07 NOTE — Assessment & Plan Note (Signed)
Hemoglobin A1c is stable at 7.3.  Her Jardiance was increased between the last visit we will recheck levels today.  Blood sugars have been averaging 141 over the last 90 days

## 2023-01-07 NOTE — Assessment & Plan Note (Signed)
For some reason continues to have vaginal itching.  She is taking Jardiance currently.  I have encouraged her to increase her water intake especially when she takes her Jardiance.  Will go ahead and treat for yeast infection and we will send her vaginal swab to check for yeast.

## 2023-01-07 NOTE — Assessment & Plan Note (Signed)
Blood pressure is well control, continue current medications.

## 2023-01-08 LAB — BASIC METABOLIC PANEL
BUN/Creatinine Ratio: 18 (ref 12–28)
BUN: 13 mg/dL (ref 8–27)
CO2: 25 mmol/L (ref 20–29)
Calcium: 9.3 mg/dL (ref 8.7–10.3)
Chloride: 102 mmol/L (ref 96–106)
Creatinine, Ser: 0.72 mg/dL (ref 0.57–1.00)
Glucose: 90 mg/dL (ref 70–99)
Potassium: 4.1 mmol/L (ref 3.5–5.2)
Sodium: 142 mmol/L (ref 134–144)
eGFR: 90 mL/min/{1.73_m2} (ref >59)

## 2023-01-08 LAB — LIPID PANEL
Chol/HDL Ratio: 2 ratio (ref 0.0–4.4)
Cholesterol, Total: 123 mg/dL (ref 100–199)
HDL: 61 mg/dL (ref >39)
LDL Chol Calc (NIH): 49 mg/dL (ref 0–99)
Triglycerides: 57 mg/dL (ref 0–149)
VLDL Cholesterol Cal: 13 mg/dL (ref 5–40)

## 2023-01-08 LAB — HEMOGLOBIN A1C
Est. average glucose Bld gHb Est-mCnc: 166 mg/dL
Hgb A1c MFr Bld: 7.4 % — ABNORMAL HIGH (ref 4.8–5.6)

## 2023-01-10 LAB — NUSWAB VAGINITIS PLUS (VG+)
Candida albicans, NAA: NEGATIVE
Candida glabrata, NAA: NEGATIVE
Chlamydia trachomatis, NAA: NEGATIVE
Neisseria gonorrhoeae, NAA: NEGATIVE
Trich vag by NAA: NEGATIVE

## 2023-01-11 ENCOUNTER — Ambulatory Visit: Payer: Self-pay

## 2023-01-11 NOTE — Patient Instructions (Signed)
Visit Information  Thank you for taking time to visit with me today. Please don't hesitate to contact me if I can be of assistance to you.   Following are the goals we discussed today:   Goals Addressed               This Visit's Progress     Patient Stated     To lower A1c <7.0% (pt-stated)   On track     Care Coordination Interventions: Evaluation of current treatment plan related to type 2 diabetes and patient's adherence to plan as established by provider Review of patient status, including review of consultant's reports, relevant laboratory and other test results, and medications completed Educated patient about the importance of drinking plenty of water while taking Jardiance, aim for 64 oz daily on average Counseled patient about using portion control with carbs and increase protein with dinner to help avoid nighttime hypoglycemic events  Counseled patient about daily glycemic control, FBS 80-130, <180 within 2 hours after a meal Mailed printed educational material related to Hypo/Hyperglycemia Assessed patient's understanding of A1c goal: <7% Lab Results  Component Value Date   HGBA1C 7.4 (H) 01/07/2023         Other     COMPLETED: RN Care Coordination Activities: further follow up needed        Care Coordination Interventions: Discussed with patient she completed her scheduled follow up with PCP  Review of patient status, including review of consultant's reports, relevant laboratory and other test results, and medications completed      COMPLETED: To lower LDL <70        Care Coordination Interventions: Provider established cholesterol goals reviewed Review of patient status, including review of consultant's reports, relevant laboratory and other test results, and medications completed Reviewed role and benefits of statin for ASCVD risk reduction Reviewed importance of limiting foods high in cholesterol Reviewed exercise goals and target of 150 minutes per  week Mailed printed educational materials related to Cholesterol Management  Lipid Panel     Component Value Date/Time   CHOL 123 01/07/2023 1624   TRIG 57 01/07/2023 1624   HDL 61 01/07/2023 1624   CHOLHDL 2.0 01/07/2023 1624   LDLCALC 49 01/07/2023 1624   LABVLDL 13 01/07/2023 1624             Our next appointment is by telephone on 05/12/23 at 12:00 PM  Please call the care guide team at [phone] if you need to cancel or reschedule your appointment.   If you are experiencing a Mental Health or Behavioral Health Crisis or need someone to talk to, please call 1-800-273-TALK (toll free, 24 hour hotline)  Patient verbalizes understanding of instructions and care plan provided today and agrees to view in MyChart. Active MyChart status and patient understanding of how to access instructions and care plan via MyChart confirmed with patient.     Delsa Sale RN BSN CCM [place]  [facility], [facility] Nurse Care Coordinator  Direct Dial: [phone] Website: [email or web address]

## 2023-01-11 NOTE — Patient Outreach (Signed)
Care Coordination   Follow Up Visit Note   01/11/2023 Name: Jacqueline Bell MRN: [id number] DOB: [birth year]  Jacqueline Bell is a 72 y.o. year old female who sees Arnette Felts, FNP for primary care. I spoke with  [name] by phone today.  What matters to the patients health and wellness today?  Patient would like to work on lowering her A1c <7.    Goals Addressed               This Visit's Progress     Patient Stated     To lower A1c <7.0% (pt-stated)   On track     Care Coordination Interventions: Evaluation of current treatment plan related to type 2 diabetes and patient's adherence to plan as established by provider Review of patient status, including review of consultant's reports, relevant laboratory and other test results, and medications completed Educated patient about the importance of drinking plenty of water while taking Jardiance, aim for 64 oz daily on average Counseled patient about using portion control with carbs and increase protein with dinner to help avoid nighttime hypoglycemic events  Counseled patient about daily glycemic control, FBS 80-130, <180 within 2 hours after a meal Mailed printed educational material related to Hypo/Hyperglycemia Assessed patient's understanding of A1c goal: <7% Lab Results  Component Value Date   HGBA1C 7.4 (H) 01/07/2023         Other     COMPLETED: RN Care Coordination Activities: further follow up needed        Care Coordination Interventions: Discussed with patient she completed her scheduled follow up with PCP  Review of patient status, including review of consultant's reports, relevant laboratory and other test results, and medications completed      COMPLETED: To lower LDL <70        Care Coordination Interventions: Provider established cholesterol goals reviewed Review of patient status, including review of consultant's reports, relevant laboratory and other test results, and medications  completed Reviewed role and benefits of statin for ASCVD risk reduction Reviewed importance of limiting foods high in cholesterol Reviewed exercise goals and target of 150 minutes per week Mailed printed educational materials related to Cholesterol Management  Lipid Panel     Component Value Date/Time   CHOL 123 01/07/2023 1624   TRIG 57 01/07/2023 1624   HDL 61 01/07/2023 1624   CHOLHDL 2.0 01/07/2023 1624   LDLCALC 49 01/07/2023 1624   LABVLDL 13 01/07/2023 1624         Interventions Today    Flowsheet Row Most Recent Value  Chronic Disease   Chronic disease during today's visit Diabetes, Chronic Kidney Disease/End Stage Renal Disease (ESRD), Hypertension (HTN), Other  [HLD]  General Interventions   General Interventions Discussed/Reviewed General Interventions Discussed, General Interventions Reviewed, Labs, Vaccines, Doctor Visits, Durable Medical Equipment (DME)  Vaccines Shingles  Doctor Visits Discussed/Reviewed Doctor Visits Reviewed, Doctor Visits Discussed, PCP  Durable Medical Equipment (DME) Glucomoter  Education Interventions   Education Provided Provided Education  Provided Verbal Education On Nutrition, Labs, Blood Sugar Monitoring, Medication, When to see the doctor  Labs Reviewed Kidney Function, Hgb A1c, Lipid Profile  Nutrition Interventions   Nutrition Discussed/Reviewed Nutrition Reviewed, Portion sizes, Fluid intake  Pharmacy Interventions   Pharmacy Dicussed/Reviewed Pharmacy Topics Discussed, Pharmacy Topics Reviewed, Medications and their functions          SDOH assessments and interventions completed:  No     Care Coordination Interventions:  Yes, provided   Follow up  plan: Follow up call scheduled for 05/12/23 @12 :00 PM    Encounter Outcome:  Patient Visit Completed

## 2023-01-12 ENCOUNTER — Encounter: Payer: Self-pay | Admitting: Nurse Practitioner

## 2023-01-30 ENCOUNTER — Other Ambulatory Visit: Payer: Self-pay | Admitting: Nurse Practitioner

## 2023-01-30 DIAGNOSIS — J3089 Other allergic rhinitis: Secondary | ICD-10-CM

## 2023-02-10 ENCOUNTER — Other Ambulatory Visit: Payer: Medicare Other | Admitting: Pharmacist

## 2023-02-10 DIAGNOSIS — E113392 Type 2 diabetes mellitus with moderate nonproliferative diabetic retinopathy without macular edema, left eye: Secondary | ICD-10-CM

## 2023-02-10 DIAGNOSIS — Z794 Long term (current) use of insulin: Secondary | ICD-10-CM

## 2023-02-10 DIAGNOSIS — I10 Essential (primary) hypertension: Secondary | ICD-10-CM

## 2023-02-10 NOTE — Patient Instructions (Signed)
Jacqueline Bell,   It was great talking to you today!  Continue your current regimen. We would like your Time In Range to remain greater than 70% - the higher the better!  Please reach out if you develop any issues with low blood sugars.   Catie Eppie Gibson, PharmD, BCACP, CPP Clinical Pharmacist [facility] [phone]

## 2023-02-10 NOTE — Progress Notes (Signed)
02/10/2023 Name: Jacqueline Bell MRN: [id number] DOB: September 29, 1950  Chief Complaint  Patient presents with   Medication Management   Diabetes   Hypertension    Jacqueline Bell is a 72 y.o. year old female who presented for a telephone visit.   They were referred to the pharmacist by their PCP for assistance in managing diabetes, hypertension, and hyperlipidemia.    Subjective:  Care Team: Primary Care Provider: Arnette Felts, FNP ; Next Scheduled Visit: 05/10/23  Medication Access/Adherence  Current Pharmacy:  [facility] - [place], [place] - [address] [place] [address] Phone: [phone] Fax: [phone]  [facility] [phone] - [place], [place] - [address] AT [facility] [address] [place] [address] Phone: [phone] Fax: [phone]  [facility] - [place], [place] - [address] [place] [address] Phone: [phone] Fax: [phone]  [facility] [phone] - [place], [place] - [address] AT [facility] [address] [place] [address] Phone: [phone] Fax: [phone]   Patient reports affordability concerns with their medications: No  Patient reports access/transportation concerns to their pharmacy: No  Patient reports adherence concerns with their medications:  No     Diabetes:  Current medications: Rybelsus 14 mg daily, Jardiance 25 mg daily, Tresiba 10-12 units daily  Libre 3:  Date of Download: last 14 days  Average Glucose: 149 mg/dL - 789/381/017/510 Time in Goal:  - Time in range 70-180: 86% - Time above range: 14% - Time below range: 0% Observed patterns:  Patient denies hypoglycemic s/sx including dizziness, shakiness, sweating. Patient denies hyperglycemic symptoms including polyuria, polydipsia, polyphagia, nocturia, neuropathy, blurred  vision.   Hypertension:  Current medications: telmisartan 20 mg daily  Patient has a validated, automated, upper arm home BP cuff Current blood pressure readings readings: reoprts readings remain well controlled, does not remember specific numbers  Hyperlipidemia/ASCVD Risk Reduction  Current lipid lowering medications: atorvastatin 20 mg daily, ezetimibe 10 mg daily   Objective:  Lab Results  Component Value Date   HGBA1C 7.4 (H) 01/07/2023    Lab Results  Component Value Date   CREATININE 0.72 01/07/2023   BUN 13 01/07/2023   NA 142 01/07/2023   K 4.1 01/07/2023   CL 102 01/07/2023   CO2 25 01/07/2023    Lab Results  Component Value Date   CHOL 123 01/07/2023   HDL 61 01/07/2023   LDLCALC 49 01/07/2023   TRIG 57 01/07/2023   CHOLHDL 2.0 01/07/2023    Medications Reviewed Today     Reviewed by Alden Hipp, RPH-CPP (Pharmacist) on 02/10/23 at 1042  Med List Status: <None>   Medication Order Taking? Sig Documenting Provider Last Dose Status Informant  atorvastatin (LIPITOR) 20 MG tablet [id number] Yes TAKE 1 TABLET DAILY Arnette Felts, FNP Taking Active   Biotin 5000 MCG CAPS [id number] Yes Take 5,000 mcg by mouth daily. [provider] Taking Active   cholecalciferol (VITAMIN D) 1000 units tablet [id number] Yes Take 5,000 Units by mouth daily. [provider] Taking Active   Continuous Glucose Sensor (FREESTYLE LIBRE 3 SENSOR) [place] [id number] Yes Place 1 sensor on the skin every 14 days. Use to check glucose continuously Arnette Felts, FNP Taking Active   empagliflozin (JARDIANCE) 25 MG TABS tablet [id number] Yes Take 1 tablet (25 mg total) by mouth daily  before breakfast. Arnette Felts, FNP Taking Active   EPINEPHrine 0.3 mg/0.3 mL IJ SOAJ injection [id number]   [provider]  Active   ezetimibe (ZETIA) 10 MG tablet [id number] Yes Take 1 tablet (10 mg total) by mouth daily. Arnette Felts, FNP Taking Active   fluticasone [facility]) 50  MCG/ACT nasal spray [id number] Yes SHAKE LIQUID AND USE 1 SPRAY IN EACH NOSTRIL DAILY Arnette Felts, FNP Taking Active   glucose blood test strip [id number] Yes Use as instructed to check blood sugar 3 times a day. Dx code E11.65 Arnette Felts, FNP Taking Active   insulin degludec (TRESIBA FLEXTOUCH) 100 UNIT/ML FlexTouch Pen [id number] Yes Inject 10 Units into the skin daily. Arnette Felts, FNP Taking Active            Med Note Clearance Coots, Desma Paganini Feb 10, 2023 10:39 AM) 10-12 units daily  loratadine (CLARITIN) 10 MG tablet [id number] Yes TAKE 1 TABLET DAILY Arnette Felts, FNP Taking Active   magnesium oxide (MAG-OX) 400 MG tablet [id number] Yes Take 400 mg by mouth daily. [provider] Taking Active Self  meloxicam (MOBIC) 15 MG tablet [id number] Yes TAKE 1 TABLET AS NEEDED Arnette Felts, FNP Taking Active            Med Note Caryn Section Nov 26, 2022 10:15 AM) Taking PRN  Multiple Vitamin (MULTIVITAMIN) tablet [id number] Yes Take 1 tablet by mouth daily. [provider] Taking Active   Olopatadine HCl (PAZEO) 0.7 % SOLN [id number] Yes Place 1 drop into both eyes daily. Bobbitt, Heywood Iles, MD Taking Active            Med Note Clearance Coots, Desma Paganini Dec 23, 2022 11:13 AM)    Omega-3 Fatty Acids (FISH OIL PO) [id number] Yes Take 2 capsules by mouth daily.  [provider] Taking Active   RYBELSUS 14 MG TABS [id number] Yes TAKE 1 TABLET DAILY Arnette Felts, FNP Taking Active   telmisartan (MICARDIS) 20 MG tablet [id number] Yes TAKE 1 TABLET DAILY Arnette Felts, FNP Taking Active               Assessment/Plan:   Diabetes: - Currently controlled per CGM readings - Reviewed goal A1c and corresponding time in range - Recommend to continue current regimen at this time. Will consider further insulin reduction at next visit.   - Recommend to check glucose using CGM  Hypertension: - Currently controlled - Recommend to continue current  regimen  Hyperlipidemia/ASCVD Risk Reduction: - Currently controlled.  - Recommend to continue current regimen   Follow Up Plan: phone call in 6 weeks  Catie TClearance Coots, PharmD, BCACP, CPP Clinical Pharmacist [facility] [phone]

## 2023-02-24 ENCOUNTER — Other Ambulatory Visit: Payer: Self-pay

## 2023-03-05 ENCOUNTER — Encounter: Payer: Self-pay | Admitting: Pharmacist

## 2023-03-07 IMAGING — MR MR FEMUR*R* W/O CM
9 series · 40 of 40 positions shown · non-contrast
Comparison: None.

CLINICAL DATA: Soft tissue mass complex high. Soft tissue mass,
anterior mid/proximal femur for 1 year. Mass is increasing in size
and becoming couple patient. Complains of right leg numbness and
weakness.

EXAM:
MRI OF THE RIGHT FEMUR WITHOUT CONTRAST
TECHNIQUE: Multiplanar, multisequence MR imaging of the right femur was
performed. No intravenous contrast was administered.

[Series 4: T1 · coronal · 4.0mm · 1.95mm/px · 4 of 30 slices shown (1 of 3)]
[im 1/30]
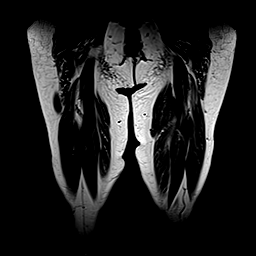
[im 10/30]
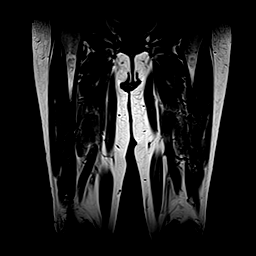
[im 20/30]
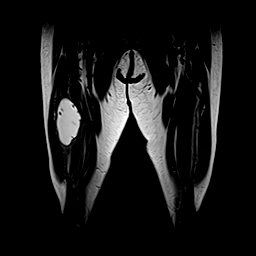
[im 30/30]
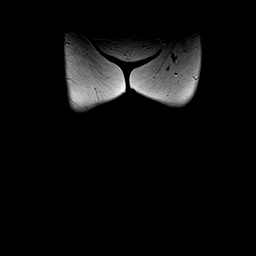

[Series 5: STIR · coronal · 4.0mm · 1.95mm/px · 3 of 30 slices shown]
[im 1/30]
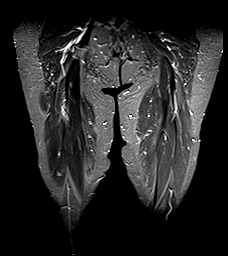
[im 15/30]
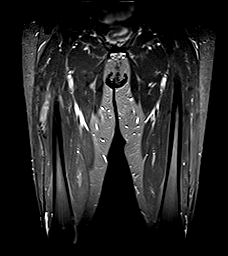
[im 30/30]
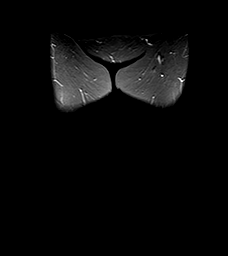

[Series 6: T1 · axial · 5.0mm · 0.94mm/px · z∈[-31,+255]mm · 5 of 49 slices shown (2 of 3)]
[im 1/49]
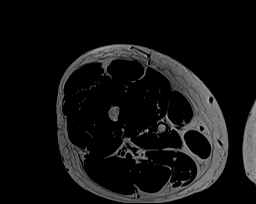
[im 13/49]
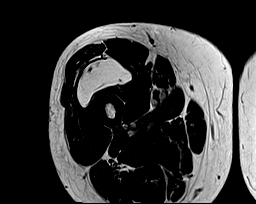
[im 25/49]
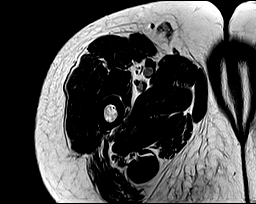
[im 37/49]
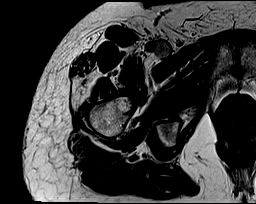
[im 49/49]
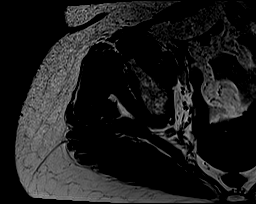

[Series 7: T2 fat-sat · axial · 5.0mm · 0.47mm/px · z∈[-31,+255]mm · 5 of 49 slices shown (1 of 3)]
[im 1/49]
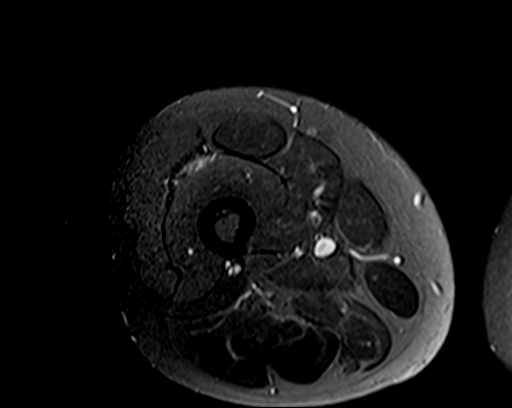
[im 13/49]
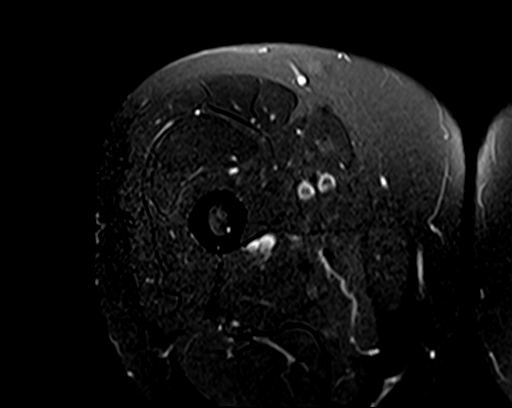
[im 25/49]
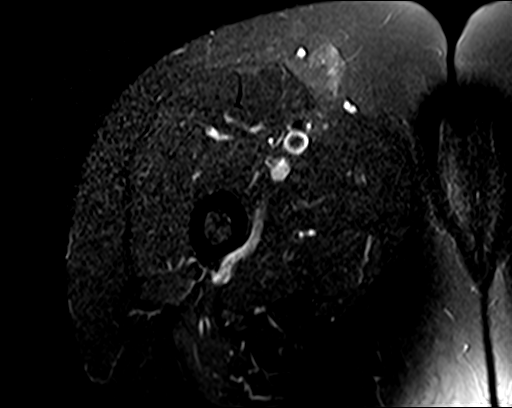
[im 37/49]
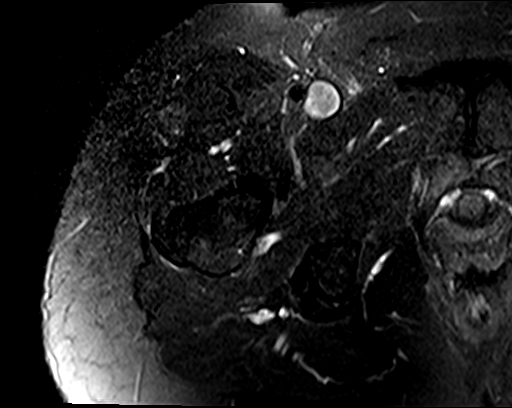
[im 49/49]
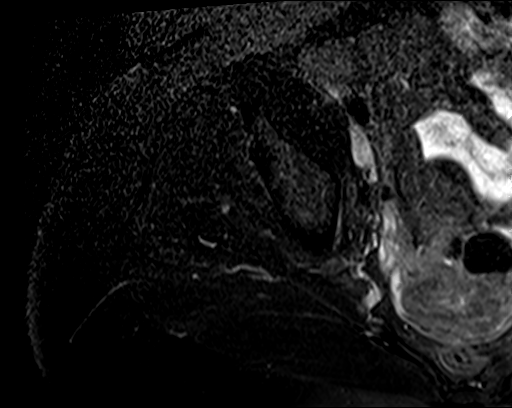

[Series 8: T1 · axial · 5.0mm · 0.94mm/px · z∈[-225,+61]mm · 5 of 49 slices shown (3 of 3)]
[im 1/49]
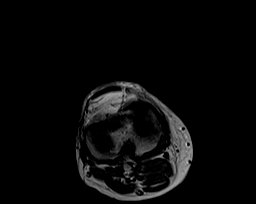
[im 13/49]
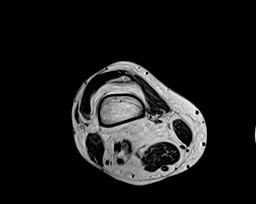
[im 25/49]
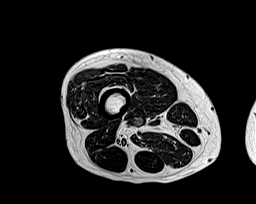
[im 37/49]
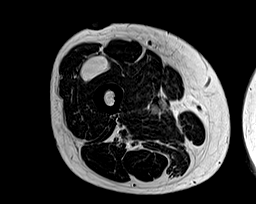
[im 49/49]
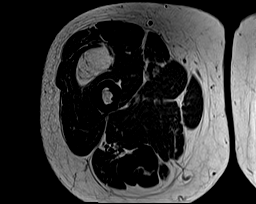

[Series 9: T2 fat-sat · axial · 5.0mm · 0.47mm/px · z∈[-225,+61]mm · 5 of 49 slices shown (2 of 3)]
[im 1/49]
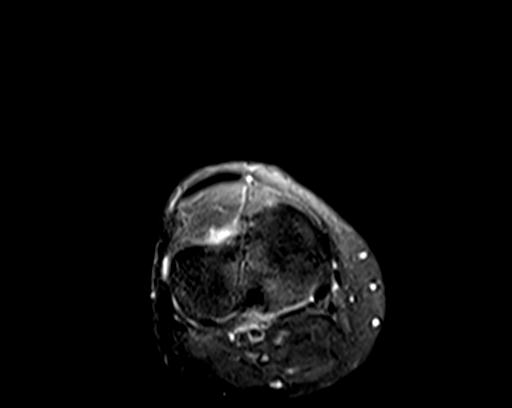
[im 13/49]
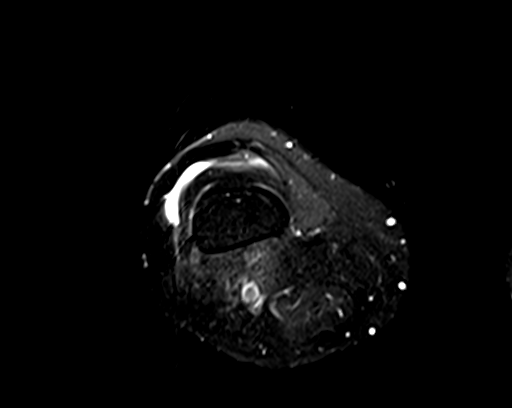
[im 25/49]
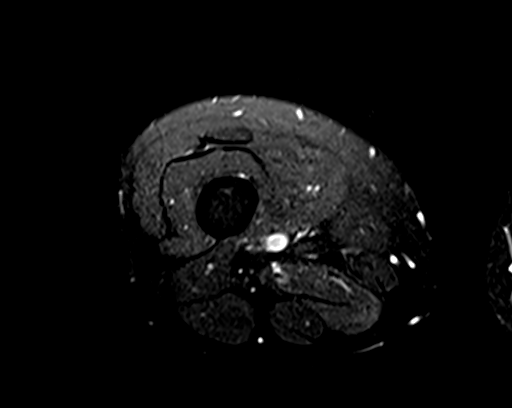
[im 37/49]
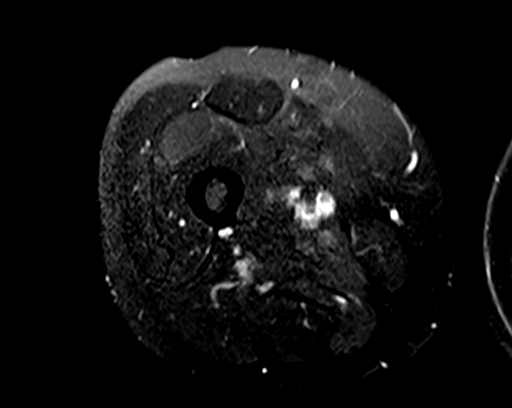
[im 49/49]
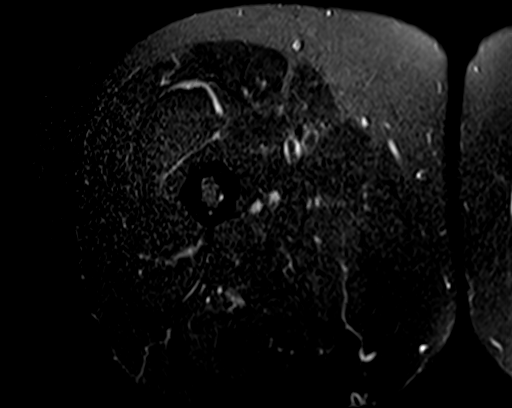

[Series 10: T2 fat-sat · sagittal · 6.0mm · 1.56mm/px · 3 of 24 slices shown (3 of 3)]
[im 1/24]
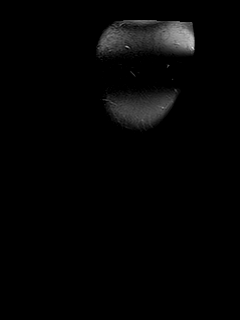
[im 12/24]
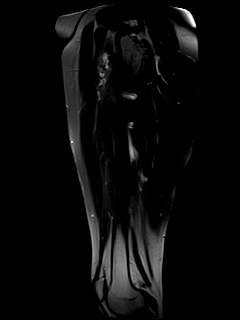
[im 24/24]
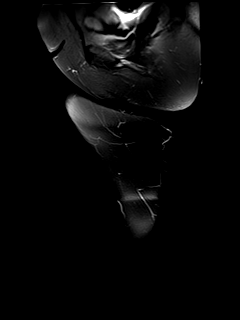

[Series 11: T1 fat-sat · axial · 5.0mm · 0.94mm/px · z∈[-31,+255]mm · 5 of 49 slices shown (1 of 2)]
[im 1/49]
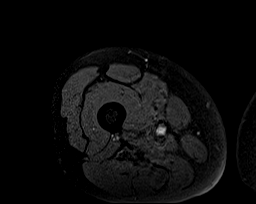
[im 13/49]
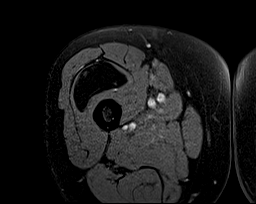
[im 25/49]
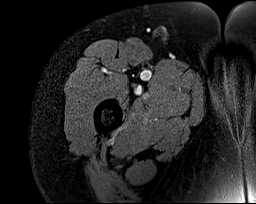
[im 37/49]
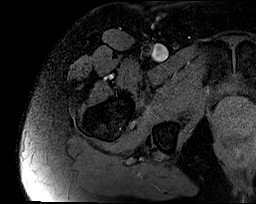
[im 49/49]
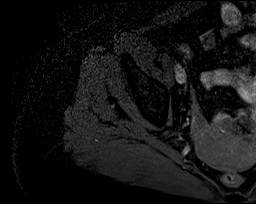

[Series 12: T1 fat-sat · axial · 5.0mm · 0.94mm/px · z∈[-231,+55]mm · 5 of 49 slices shown (2 of 2)]
[im 1/49]
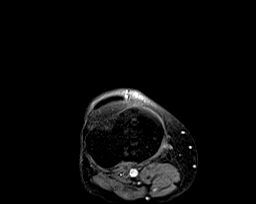
[im 13/49]
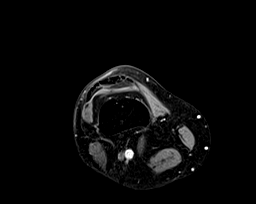
[im 25/49]
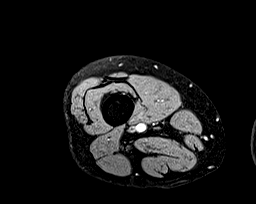
[im 37/49]
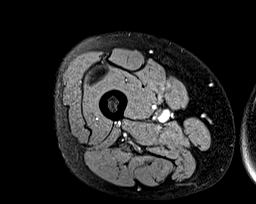
[im 49/49]
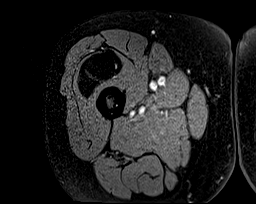

[40 of 40 positions shown; findings below may reference images not displayed]

FINDINGS: Bones/Joint/Cartilage

There is no evidence of fracture. Cortex is intact. There is no
significant marrow signal alteration.

Ligaments

No obvious ligamentous abnormality involving partially visualized of
the hip and knee.

Muscles and Tendons

There is a 9.7 x 5.5 x 2.9 cm fat containing mass with septations
within the anterior compartment of thigh corresponding to the
palpable area of concern. The mass is centered within the vastus
intermedius muscle, and contacts the vastus lateralis and vastus
intermedius tendons. There is minimal adjacent intramuscular edema
proximal and distal to the mass within the vastus intermedius muscle
which is likely reactive strain.

Soft tissues

There is no neurovascular bundle involvement from the aforementioned
fat containing mass. Partially visualized small right knee joint
effusion.
IMPRESSION: Intramuscular, fat-containing mass with septations within the vastus
intermedius muscle measuring 9.7 x 5.5 x 2.9 cm, which could
represent an atypical lipomatous tumor or well-differentiated
liposarcoma. No neurovascular bundle or osseous involvement.
Recommend orthopedic oncology referral.

## 2023-03-25 ENCOUNTER — Ambulatory Visit: Payer: Medicare Other | Admitting: Family Medicine

## 2023-03-25 ENCOUNTER — Encounter: Payer: Self-pay | Admitting: Family Medicine

## 2023-03-25 VITALS — BP 140/70 | HR 93 | Temp 97.8°F | Ht 67.0 in | Wt 164.0 lb

## 2023-03-25 DIAGNOSIS — R109 Unspecified abdominal pain: Secondary | ICD-10-CM | POA: Diagnosis not present

## 2023-03-25 DIAGNOSIS — R82998 Other abnormal findings in urine: Secondary | ICD-10-CM

## 2023-03-25 DIAGNOSIS — R6883 Chills (without fever): Secondary | ICD-10-CM

## 2023-03-25 DIAGNOSIS — R509 Fever, unspecified: Secondary | ICD-10-CM | POA: Diagnosis not present

## 2023-03-25 LAB — POCT URINALYSIS DIPSTICK
Bilirubin, UA: NEGATIVE
Blood, UA: NEGATIVE
Glucose, UA: NEGATIVE
Ketones, UA: NEGATIVE
Nitrite, UA: NEGATIVE
Protein, UA: NEGATIVE
Spec Grav, UA: <=1.005 — AB (ref 1.010–1.025)
Urobilinogen, UA: 0.2 U/dL
pH, UA: 5.5 (ref 5.0–8.0)

## 2023-03-25 LAB — POC SOFIA 2 FLU + SARS ANTIGEN FIA
Influenza A, POC: NEGATIVE
Influenza B, POC: NEGATIVE
SARS Coronavirus 2 Ag: NEGATIVE

## 2023-03-25 MED ORDER — CIPROFLOXACIN HCL 500 MG PO TABS: 500.0000 mg | ORAL_TABLET | Freq: Two times a day (BID) | ORAL | 0 refills | Status: AC

## 2023-03-25 NOTE — Progress Notes (Signed)
I,Jameka J Llittleton, CMA,acting as a Neurosurgeon for [employer], NP.,have documented all relevant documentation on the behalf of Ellender Hose, NP,as directed by  Ellender Hose, NP while in the presence of Ellender Hose, NP.  Subjective:  Patient ID: Jacqueline Bell , female    DOB: Jul 03, 1950 , 73 y.o.   MRN: [id number]  Chief Complaint  Patient presents with   Abdominal Pain    HPI  Patient is a 73 year old female presents today for abdominal pain, fever and chills. Patient reports she has had her symptoms for the past 3 days. She denies having diarrhea or vomiting. Patient reports she had a fever of 102.4 and last night her temp was 100.4 and this morning her temp is 100.4. Patient states her last BM was this morning and the stool was normal , not hard or watery. Patient states she has episodes of urgency, frequency and cloudy urine. Patient did not take her BP medication today because she is not feeling well and had not eaten anything, so BP is slightly elevated.     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 No pertinent family history.   Current Outpatient Medications:    atorvastatin (LIPITOR) 20 MG tablet, TAKE 1 TABLET DAILY, Disp: 90 tablet, Rfl: 3   Biotin 5000 MCG CAPS, Take 5,000 mcg by mouth daily., Disp: , Rfl:    cholecalciferol (VITAMIN D) 1000 units tablet, Take 5,000 Units by mouth daily., Disp: , Rfl:    Continuous Glucose Sensor (FREESTYLE LIBRE 3 SENSOR) MISC, Place 1 sensor on the skin every 14 days. Use to check glucose continuously, Disp: 6 each, Rfl: 3   empagliflozin (JARDIANCE) 25 MG TABS tablet, Take 1 tablet (25 mg total) by mouth daily before breakfast., Disp: 90 tablet, Rfl: 3   EPINEPHrine 0.3 mg/0.3 mL  IJ SOAJ injection, , Disp: , Rfl:    ezetimibe (ZETIA) 10 MG tablet, Take 1 tablet (10 mg total) by mouth daily., Disp: 90 tablet, Rfl: 1   fluticasone (FLONASE) 50 MCG/ACT nasal spray, SHAKE LIQUID AND USE 1 SPRAY IN EACH NOSTRIL DAILY, Disp: 16 g, Rfl: 2   glucose blood test strip, Use as instructed to check blood sugar 3 times a day. Dx code E11.65, Disp: 100 each, Rfl: 12   insulin degludec (TRESIBA FLEXTOUCH) 100 UNIT/ML FlexTouch Pen, Inject 10 Units into the skin daily., Disp: 15 mL, Rfl: 1   loratadine (CLARITIN) 10 MG tablet, TAKE 1 TABLET DAILY, Disp: 90 tablet, Rfl: 3   magnesium oxide (MAG-OX) 400 MG tablet, Take 400 mg by mouth daily., Disp: , Rfl:    meloxicam (MOBIC) 15 MG tablet, TAKE 1 TABLET AS NEEDED, Disp: 90 tablet, Rfl: 3   Multiple Vitamin (MULTIVITAMIN) tablet, Take 1 tablet by mouth daily., Disp: , Rfl:    Omega-3 Fatty Acids (FISH OIL PO), Take 2 capsules by mouth daily. , Disp: , Rfl:    RYBELSUS 14 MG TABS, TAKE 1 TABLET DAILY, Disp: 90 tablet, Rfl: 3   telmisartan (MICARDIS) 20 MG tablet, TAKE 1 TABLET DAILY, Disp: 90 tablet, Rfl: 3   Allergies  Allergen Reactions   Iodine Hives   Shellfish Allergy Hives   Tramadol Rash     Review of  Systems  Constitutional:  Positive for appetite change, chills and fever.  Gastrointestinal:  Positive for abdominal pain. Negative for blood in stool, constipation, diarrhea and vomiting.  Genitourinary:  Positive for flank pain and frequency.  Skin: Negative.   Neurological: Negative.   Psychiatric/Behavioral: Negative.       Today's Vitals   03/25/23 1529  BP: (!) 140/70  Pulse: 93  Temp: 97.8 F (36.6 C)  TempSrc: Oral  Weight: 164 lb (74.4 kg)  Height: 5\' 7"  (1.702 m)  PainSc: 5   PainLoc: Abdomen   Body mass index is 25.69 kg/m.  Wt Readings from Last 3 Encounters:  03/25/23 164 lb (74.4 kg)  01/07/23 167 lb 9.6 oz (76 kg)  11/11/22 170 lb 3.2 oz (77.2 kg)    The ASCVD Risk score (Arnett DK, et al., 2019)  failed to calculate for the following reasons:   The valid total cholesterol range is 130 to 320 mg/dL  Objective:  Physical Exam HENT:     Head: Normocephalic.  Pulmonary:     Effort: Pulmonary effort is normal.     Breath sounds: Normal breath sounds.  Abdominal:     Tenderness: There is abdominal tenderness in the suprapubic area. There is right CVA tenderness.  Skin:    General: Skin is warm and dry.  Neurological:     General: No focal deficit present.     Mental Status: She is alert and oriented to person, place, and time.  Psychiatric:        Mood and Affect: Mood normal.         Assessment And Plan:  Abdominal pain, unspecified abdominal location -     POCT urinalysis dipstick  Fever and chills -     POC SOFIA 2 FLU + SARS ANTIGEN FIA  Leukocytes in urine -     Ciprofloxacin HCl; Take 1 tablet (500 mg total) by mouth 2 (two) times daily for 3 days.  Dispense: 6 tablet; Refill: 0 -     Urine Culture    Return if symptoms worsen or fail to improve, for keep next appt.  Patient was given opportunity to ask questions. Patient verbalized understanding of the plan and was able to repeat key elements of the plan. All questions were answered to their satisfaction.    I, Ellender Hose, NP, have reviewed all documentation for this visit. The documentation on 03/31/2023 for the exam, diagnosis, procedures, and orders are all accurate and complete.   IF YOU HAVE BEEN REFERRED TO A SPECIALIST, IT MAY TAKE 1-2 WEEKS TO SCHEDULE/PROCESS THE REFERRAL. IF YOU HAVE NOT HEARD FROM US/SPECIALIST IN TWO WEEKS, PLEASE GIVE [place] A CALL AT [phone].

## 2023-03-28 LAB — URINE CULTURE

## 2023-03-31 ENCOUNTER — Encounter: Payer: Self-pay | Admitting: Family Medicine

## 2023-03-31 DIAGNOSIS — R109 Unspecified abdominal pain: Secondary | ICD-10-CM | POA: Insufficient documentation

## 2023-03-31 DIAGNOSIS — R82998 Other abnormal findings in urine: Secondary | ICD-10-CM | POA: Insufficient documentation

## 2023-03-31 DIAGNOSIS — R509 Fever, unspecified: Secondary | ICD-10-CM | POA: Insufficient documentation

## 2023-03-31 NOTE — Progress Notes (Signed)
Urine culture is positive for urinary tract infection(UTI). Have you finished taking all the antibiotics? Do you feel better? I just call and left a voice message on your cell phone.  Thank you,  Jacqueline Bible

## 2023-04-05 ENCOUNTER — Other Ambulatory Visit: Payer: Self-pay | Admitting: Family Medicine

## 2023-04-05 ENCOUNTER — Other Ambulatory Visit: Payer: Self-pay | Admitting: Pharmacist

## 2023-04-05 DIAGNOSIS — N760 Acute vaginitis: Secondary | ICD-10-CM

## 2023-04-05 MED ORDER — NUVESSA 1.3 % VA GEL: 1.0000 | Freq: Every day | VAGINAL | 0 refills | Status: DC

## 2023-04-07 ENCOUNTER — Other Ambulatory Visit: Payer: Self-pay

## 2023-04-08 ENCOUNTER — Other Ambulatory Visit: Payer: Self-pay | Admitting: Family Medicine

## 2023-04-08 MED ORDER — METRONIDAZOLE 0.75 % VA GEL: 1.0000 | Freq: Every day | VAGINAL | 0 refills | Status: AC

## 2023-05-05 ENCOUNTER — Other Ambulatory Visit: Payer: Self-pay | Admitting: Nurse Practitioner

## 2023-05-05 DIAGNOSIS — E785 Hyperlipidemia, unspecified: Secondary | ICD-10-CM

## 2023-05-10 ENCOUNTER — Encounter: Payer: Self-pay | Admitting: Nurse Practitioner

## 2023-05-10 ENCOUNTER — Ambulatory Visit: Payer: Medicare Other | Admitting: Nurse Practitioner

## 2023-05-10 VITALS — BP 120/80 | HR 76 | Temp 98.2°F | Ht 67.0 in | Wt 160.0 lb

## 2023-05-10 DIAGNOSIS — Z79899 Other long term (current) drug therapy: Secondary | ICD-10-CM | POA: Diagnosis not present

## 2023-05-10 DIAGNOSIS — E113392 Type 2 diabetes mellitus with moderate nonproliferative diabetic retinopathy without macular edema, left eye: Secondary | ICD-10-CM

## 2023-05-10 DIAGNOSIS — N3000 Acute cystitis without hematuria: Secondary | ICD-10-CM

## 2023-05-10 DIAGNOSIS — Z23 Encounter for immunization: Secondary | ICD-10-CM

## 2023-05-10 DIAGNOSIS — E782 Mixed hyperlipidemia: Secondary | ICD-10-CM

## 2023-05-10 DIAGNOSIS — I10 Essential (primary) hypertension: Secondary | ICD-10-CM

## 2023-05-10 DIAGNOSIS — Z794 Long term (current) use of insulin: Secondary | ICD-10-CM | POA: Diagnosis not present

## 2023-05-10 MED ORDER — ZOSTER VAC RECOMB ADJUVANTED 50 MCG/0.5ML IM SUSR: 0.5000 mL | Freq: Once | INTRAMUSCULAR | 0 refills | Status: AC

## 2023-05-10 NOTE — Progress Notes (Signed)
 Madelaine Bhat, CMA,acting as a Neurosurgeon for Arnette Felts, FNP.,have documented all relevant documentation on the behalf of Arnette Felts, FNP,as directed by  Arnette Felts, FNP while in the presence of Arnette Felts, FNP.  Subjective:  Patient ID: Jacqueline Bell , female    DOB: Jul 25, 1950 , 73 y.o.   MRN: [id number]  Chief Complaint  Patient presents with   Diabetes   Hypertension   Hyperlipidemia    HPI  Patient presents today for a bp and dm follow up, Patient reports compliance with medication. Patient denies any chest pain, SOB, or headaches. Patient is concerned about a scratch on her leg. She also complains of having night sweats. Patient is complaining of itching she has not changed her soaps, uses dove. She is itching all over.    Diabetes She presents for her follow-up diabetic visit. She has type 2 diabetes mellitus. Her disease course has been stable. Pertinent negatives for hypoglycemia include no confusion, dizziness or headaches. There are no diabetic associated symptoms. Pertinent negatives for diabetes include no chest pain, no fatigue, no polydipsia, no polyphagia, no polyuria and no weakness. There are no hypoglycemic complications. Symptoms are stable. Diabetic complications include retinopathy. Risk factors for coronary artery disease include sedentary lifestyle, diabetes mellitus and dyslipidemia. Current diabetic treatment includes oral agent (dual therapy). She is compliant with treatment all of the time. Her weight is stable. She is following a generally healthy diet. When asked about meal planning, she reported none. She has not had a previous visit with a dietitian. She rarely participates in exercise. There is no change in her home blood glucose trend. (Blood sugar this morning was 112, then 122. No lows. ) An ACE inhibitor/angiotensin II receptor blocker is being taken. She does not see a podiatrist.Eye exam is current (11/11/2022).  Hypertension This is a chronic problem.  The current episode started more than 1 year ago. The problem is unchanged. The problem is uncontrolled. Pertinent negatives include no anxiety, chest pain, headaches, palpitations or shortness of breath. Risk factors for coronary artery disease include obesity, sedentary lifestyle and diabetes mellitus. Past treatments include ACE inhibitors. The current treatment provides mild improvement. There are no compliance problems.  Hypertensive end-organ damage includes retinopathy. There is no history of angina or kidney disease. There is no history of chronic renal disease.     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 No pertinent family history.   Current Outpatient Medications:    atorvastatin (LIPITOR) 20 MG tablet, TAKE 1 TABLET DAILY, Disp: 90 tablet, Rfl: 3   Biotin 5000 MCG CAPS, Take 5,000 mcg by mouth daily., Disp: , Rfl:    cholecalciferol (VITAMIN D) 1000 units tablet, Take 5,000 Units by mouth daily., Disp: , Rfl:    Continuous Glucose Sensor (FREESTYLE LIBRE 3 SENSOR) MISC, Place 1 sensor on the skin every 14 days. Use to check glucose continuously, Disp: 6 each, Rfl: 3   empagliflozin (JARDIANCE) 25 MG TABS tablet, Take 1 tablet (25 mg total) by mouth daily before breakfast., Disp: 90 tablet, Rfl: 3   EPINEPHrine 0.3 mg/0.3 mL IJ SOAJ injection, , Disp: , Rfl:    ezetimibe (ZETIA) 10 MG tablet, TAKE 1 TABLET DAILY, Disp: 90 tablet, Rfl: 3  fluticasone (FLONASE) 50 MCG/ACT nasal spray, SHAKE LIQUID AND USE 1 SPRAY IN EACH NOSTRIL DAILY, Disp: 16 g, Rfl: 2   glucose blood test strip, Use as instructed to check blood sugar 3 times a day. Dx code E11.65, Disp: 100 each, Rfl: 12   insulin degludec (TRESIBA FLEXTOUCH) 100 UNIT/ML  FlexTouch Pen, Inject 10 Units into the skin daily. (Patient taking differently: Inject 10 Units into the skin daily. Patient is taking as needed), Disp: 15 mL, Rfl: 1   loratadine (CLARITIN) 10 MG tablet, TAKE 1 TABLET DAILY, Disp: 90 tablet, Rfl: 3   magnesium oxide (MAG-OX) 400 MG tablet, Take 400 mg by mouth daily., Disp: , Rfl:    meloxicam (MOBIC) 15 MG tablet, TAKE 1 TABLET AS NEEDED, Disp: 90 tablet, Rfl: 3   Multiple Vitamin (MULTIVITAMIN) tablet, Take 1 tablet by mouth daily., Disp: , Rfl:    Omega-3 Fatty Acids (FISH OIL PO), Take 2 capsules by mouth daily. , Disp: , Rfl:    RYBELSUS 14 MG TABS, TAKE 1 TABLET DAILY, Disp: 90 tablet, Rfl: 3   telmisartan (MICARDIS) 20 MG tablet, TAKE 1 TABLET DAILY, Disp: 90 tablet, Rfl: 3   atorvastatin (LIPITOR) 10 MG tablet, Take 10 mg by mouth daily., Disp: , Rfl:    Allergies  Allergen Reactions   Iodine Hives   Shellfish Allergy Hives   Tramadol Rash     Review of Systems  Constitutional: Negative.  Negative for fatigue.  Eyes: Negative.   Respiratory:  Negative for shortness of breath.   Cardiovascular:  Negative for chest pain and palpitations.  Endocrine: Negative for polydipsia, polyphagia and polyuria.  Musculoskeletal: Negative.   Neurological:  Negative for dizziness, weakness and headaches.  Psychiatric/Behavioral: Negative.  Negative for confusion.      Today's Vitals   05/10/23 1151 05/10/23 1234  BP: 130/70 120/80  Pulse: 76   Temp: 98.2 F (36.8 C)   TempSrc: Oral   Weight: 160 lb (72.6 kg)   Height: 5\' 7"  (1.702 m)   PainSc: 0-No pain    Body mass index is 25.06 kg/m.  Wt Readings from Last 3 Encounters:  05/10/23 160 lb (72.6 kg)  03/25/23 164 lb (74.4 kg)  01/07/23 167 lb 9.6 oz (76 kg)     Objective:  Physical Exam Vitals reviewed.  Constitutional:      General: She is not in acute distress.    Appearance: Normal appearance.  Cardiovascular:     Rate and Rhythm: Normal rate and regular rhythm.      Pulses: Normal pulses.     Heart sounds: Normal heart sounds. No murmur heard. Pulmonary:     Effort: Pulmonary effort is normal. No respiratory distress.     Breath sounds: Normal breath sounds. No wheezing.  Musculoskeletal:        General: No swelling or tenderness. Normal range of motion.     Right lower leg: No edema.     Left lower leg: No edema.  Skin:    General: Skin is warm and dry.     Capillary Refill: Capillary refill takes less than 2 seconds.     Coloration: Skin is not jaundiced.     Findings: No bruising.  Neurological:     General: No focal deficit present.     Mental Status: She is alert and oriented to person, place, and time.     Cranial Nerves: No cranial nerve deficit.     Motor: No weakness.  Psychiatric:  Mood and Affect: Mood normal.        Behavior: Behavior normal.        Thought Content: Thought content normal.        Judgment: Judgment normal.         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 Continue current medications   Orders: -     CMP14+EGFR -     Hemoglobin A1c  Mixed hyperlipidemia Assessment & Plan: Chronic, stable. Continue current medications  Orders: -     CMP14+EGFR -     Lipid panel  Essential hypertension Assessment & Plan: Blood pressure is well control, continue current medications.   Orders: -     CMP14+EGFR  Acute cystitis without hematuria Assessment & Plan: Recently treated for UTI, will recheck urine culture.   Orders: -     Urine Culture  Need for shingles vaccine Assessment & Plan: Checked [facility] unable to get in office, Rx sent to pharmacy  Orders: -     Zoster Vac Recomb Adjuvanted; Inject 0.5 mLs into the muscle once for 1 dose.  Dispense: 0.5 mL; Refill: 0  Other long term (current) drug therapy -      CBC    Return for controlled DM check-4 months.  Patient was given opportunity to ask questions. Patient verbalized understanding of the plan and was able to repeat key elements of the plan. All questions were answered to their satisfaction.    Jeanell Sparrow, FNP, have reviewed all documentation for this visit. The documentation on 05/10/23 for the exam, diagnosis, procedures, and orders are all accurate and complete.   IF YOU HAVE BEEN REFERRED TO A SPECIALIST, IT MAY TAKE 1-2 WEEKS TO SCHEDULE/PROCESS THE REFERRAL. IF YOU HAVE NOT HEARD FROM US/SPECIALIST IN TWO WEEKS, PLEASE GIVE [place] A CALL AT [phone].

## 2023-05-10 NOTE — Patient Instructions (Addendum)

## 2023-05-11 LAB — LIPID PANEL
Chol/HDL Ratio: 2.6 ratio (ref 0.0–4.4)
Cholesterol, Total: 146 mg/dL (ref 100–199)
HDL: 57 mg/dL (ref >39)
LDL Chol Calc (NIH): 74 mg/dL (ref 0–99)
Triglycerides: 78 mg/dL (ref 0–149)
VLDL Cholesterol Cal: 15 mg/dL (ref 5–40)

## 2023-05-11 LAB — CMP14+EGFR
ALT: 15 IU/L (ref 0–32)
AST: 24 IU/L (ref 0–40)
Albumin: 4.4 g/dL (ref 3.8–4.8)
Alkaline Phosphatase: 63 IU/L (ref 44–121)
BUN/Creatinine Ratio: 13 (ref 12–28)
BUN: 11 mg/dL (ref 8–27)
Bilirubin Total: 0.3 mg/dL (ref 0.0–1.2)
CO2: 25 mmol/L (ref 20–29)
Calcium: 9.4 mg/dL (ref 8.7–10.3)
Chloride: 101 mmol/L (ref 96–106)
Creatinine, Ser: 0.86 mg/dL (ref 0.57–1.00)
Globulin, Total: 3.2 g/dL (ref 1.5–4.5)
Glucose: 126 mg/dL — ABNORMAL HIGH (ref 70–99)
Potassium: 4.2 mmol/L (ref 3.5–5.2)
Sodium: 141 mmol/L (ref 134–144)
Total Protein: 7.6 g/dL (ref 6.0–8.5)
eGFR: 72 mL/min/{1.73_m2} (ref >59)

## 2023-05-11 LAB — CBC
Hematocrit: 41.8 % (ref 34.0–46.6)
Hemoglobin: 13.5 g/dL (ref 11.1–15.9)
MCH: 29.2 pg (ref 26.6–33.0)
MCHC: 32.3 g/dL (ref 31.5–35.7)
MCV: 90 fL (ref 79–97)
Platelets: 422 10*3/uL (ref 150–450)
RBC: 4.63 x10E6/uL (ref 3.77–5.28)
RDW: 12.8 % (ref 11.7–15.4)
WBC: 4.9 10*3/uL (ref 3.4–10.8)

## 2023-05-11 LAB — HEMOGLOBIN A1C
Est. average glucose Bld gHb Est-mCnc: 180 mg/dL
Hgb A1c MFr Bld: 7.9 % — ABNORMAL HIGH (ref 4.8–5.6)

## 2023-05-12 ENCOUNTER — Encounter: Payer: Self-pay | Admitting: Nurse Practitioner

## 2023-05-12 ENCOUNTER — Ambulatory Visit: Payer: Self-pay

## 2023-05-12 LAB — URINE CULTURE

## 2023-05-13 NOTE — Patient Outreach (Signed)
 Care Coordination   Follow Up Visit Note   05/12/2023 Name: Jacqueline Bell MRN: [id number] DOB: 1951-02-09  Jacqueline Bell is a 73 y.o. year old female who sees Jacqueline Felts, FNP for primary care. I spoke with  Jacqueline Bell by phone today.  What matters to the patients health and wellness today?  Patient would like to lower her A1c by taking her [name] exactly as directed.     Goals Addressed               This Visit's Progress     Patient Stated     To lower A1c <7.0% (pt-stated)   On track     Care Coordination Interventions: Provided education to patient about basic DM disease process Reviewed medications with patient and discussed importance of medication adherence Determined patient has not been taking her [name] as prescribed, she has been taking as needed based on her blood sugars Educated patient about the indication, dosage, frequency and pharmacokinetics of Tresiba and instructed patient to take her [name] as directed by  her doctor, she verbalizes understanding and is agreeable  Provided patient with written educational materials related to hypo and hyperglycemia and importance of correct treatment Advised patient, providing education and rationale, to check cbg daily before breakfast and at bedtime and record, calling PCP for findings outside established parameters Review of patient status, including review of consultants reports, relevant laboratory and other test results, and medications completed Counseled on Diabetic diet, my plate method, 213 minutes of moderate intensity exercise/week Assessed patient's understanding of A1c goal: <7% Lab Results  Component Value Date   HGBA1C 7.9 (H) 05/10/2023          Interventions Today    Flowsheet Row Most Recent Value  Chronic Disease   Chronic disease during today's visit Diabetes, Other  [s/p UTI]  General Interventions   General Interventions Discussed/Reviewed General Interventions Discussed, General  Interventions Reviewed, Labs, Doctor Visits, Communication with  Doctor Visits Discussed/Reviewed Doctor Visits Discussed, Doctor Visits Reviewed, PCP  Communication with PCP/Specialists  Jacqueline Felts FNP]  Exercise Interventions   Exercise Discussed/Reviewed Exercise Discussed, Exercise Reviewed, Physical Activity  Physical Activity Discussed/Reviewed Physical Activity Discussed, Physical Activity Reviewed, Types of exercise  Education Interventions   Education Provided Provided Education  Provided Verbal Education On Nutrition, Labs, When to see the doctor, Medication, Exercise  Nutrition Interventions   Nutrition Discussed/Reviewed Nutrition Discussed, Nutrition Reviewed, Fluid intake, Decreasing sugar intake, Carbohydrate meal planning, Portion sizes  Pharmacy Interventions   Pharmacy Dicussed/Reviewed Pharmacy Topics Discussed, Pharmacy Topics Reviewed, Medications and their functions, Medication Adherence  Medication Adherence Not taking medication          SDOH assessments and interventions completed:  Yes  SDOH Interventions Today    Flowsheet Row Most Recent Value  SDOH Interventions   Utilities Interventions Intervention Not Indicated        Care Coordination Interventions:  Yes, provided   Follow up plan: Follow up call scheduled for 06/09/23 @12 :00 PM    Encounter Outcome:  Patient Visit Completed

## 2023-05-13 NOTE — Patient Instructions (Signed)
 Visit Information  Thank you for taking time to visit with me today. Please don't hesitate to contact me if I can be of assistance to you.   Following are the goals we discussed today:   Goals Addressed               This Visit's Progress     Patient Stated     To lower A1c <7.0% (pt-stated)   On track     Care Coordination Interventions: Provided education to patient about basic DM disease process Reviewed medications with patient and discussed importance of medication adherence Determined patient has not been taking her [name] as prescribed, she has been taking as needed based on her blood sugars Educated patient about the indication, dosage, frequency and pharmacokinetics of Tresiba and instructed patient to take her [name] as directed by  her doctor, she verbalizes understanding and is agreeable  Provided patient with written educational materials related to hypo and hyperglycemia and importance of correct treatment Advised patient, providing education and rationale, to check cbg daily before breakfast and at bedtime and record, calling PCP for findings outside established parameters Review of patient status, including review of consultants reports, relevant laboratory and other test results, and medications completed Counseled on Diabetic diet, my plate method, 440 minutes of moderate intensity exercise/week Assessed patient's understanding of A1c goal: <7% Lab Results  Component Value Date   HGBA1C 7.9 (H) 05/10/2023              Our next appointment is by telephone on 06/09/23 at 12:00 PM  Please call the care guide team at [phone] if you need to cancel or reschedule your appointment.   If you are experiencing a Mental Health or Behavioral Health Crisis or need someone to talk to, please call 1-800-273-TALK (toll free, 24 hour hotline)  Patient verbalizes understanding of instructions and care plan provided today and agrees to view in MyChart. Active MyChart  status and patient understanding of how to access instructions and care plan via MyChart confirmed with patient.     Delsa Sale RN BSN CCM [place]  [facility], [facility] Nurse Care Coordinator  Direct Dial: [phone] Website: [email or web address]

## 2023-05-21 DIAGNOSIS — Z23 Encounter for immunization: Secondary | ICD-10-CM | POA: Insufficient documentation

## 2023-05-21 NOTE — Assessment & Plan Note (Signed)
 Chronic, stable.  Continue current medications.

## 2023-05-21 NOTE — Assessment & Plan Note (Signed)
 Blood pressure is well control, continue current medications.

## 2023-05-21 NOTE — Assessment & Plan Note (Signed)
 Checked [facility] unable to get in office, Rx sent to pharmacy

## 2023-05-21 NOTE — Assessment & Plan Note (Signed)
 Recently treated for UTI, will recheck urine culture.

## 2023-05-25 ENCOUNTER — Other Ambulatory Visit: Payer: Self-pay | Admitting: Nurse Practitioner

## 2023-05-25 MED ORDER — CEPHALEXIN 500 MG PO CAPS: 500.0000 mg | ORAL_CAPSULE | Freq: Two times a day (BID) | ORAL | 0 refills | Status: AC

## 2023-06-09 ENCOUNTER — Ambulatory Visit: Payer: Self-pay

## 2023-06-09 NOTE — Patient Instructions (Addendum)
 Visit Information  Thank you for taking time to visit with me today. Please don't hesitate to contact me if I can be of assistance to you.   Following are the goals we discussed today:   Goals Addressed               This Visit's Progress     Patient Stated     To lower A1c <7.0% (pt-stated)   On track     Care Coordination Interventions: Evaluation of current treatment plan related to type 2 diabetes  and patient's adherence to plan as established by provider Reviewed and discussed patient's average home blood sugar readings, patient reports an overall average is running in the 130's Reviewed medications with patient and discussed importance of medication adherence Positive reinforcement given to patient for making efforts to improve her diabetes  Educated patient re: hypoglycemic events may occur with current [place] dosage once diabetes becomes better controlled Advised patient, providing education and rationale, to check cbg daily before meals  and record, calling PCP  for findings outside established parameters Discussed plans with patient for ongoing care coordination follow up and provided patient with direct contact information for nurse care coordinator Scheduled nurse follow up call for 07/07/23 @11 :30 AM Assessed patient's understanding of A1c goal: <7% Lab Results  Component Value Date   HGBA1C 7.9 (H) 05/10/2023                Our next appointment is by telephone on 07/07/23 at 11:30 AM  Please call the care guide team at [phone] if you need to cancel or reschedule your appointment.   If you are experiencing a Mental Health or Behavioral Health Crisis or need someone to talk to, please call 1-800-273-TALK (toll free, 24 hour hotline)  Patient verbalizes understanding of instructions and care plan provided today and agrees to view in MyChart. Active MyChart status and patient understanding of how to access instructions and care plan via MyChart confirmed  with patient.     Delsa Sale RN BSN CCM [place]  [facility], [facility] Health Nurse Care Coordinator  Direct Dial: [phone] Website: [email or web address]

## 2023-06-09 NOTE — Patient Outreach (Signed)
 Care Coordination   Follow Up Visit Note   06/09/2023 Name: Jacqueline Bell MRN: [id number] DOB: 06-06-1950  Jacqueline Bell is a 73 y.o. year old female who sees Arnette Felts, FNP for primary care. I spoke with  Jacqueline Bell by phone today.  What matters to the patients health and wellness today?  Patient would like to improve her self-health management of diabetes to help lower her A1c <7.0%.    Goals Addressed               This Visit's Progress     Patient Stated     To lower A1c <7.0% (pt-stated)   On track     Care Coordination Interventions: Evaluation of current treatment plan related to type 2 diabetes  and patient's adherence to plan as established by provider Reviewed and discussed patient's average home blood sugar readings, patient reports an overall average is running in the 130's Reviewed medications with patient and discussed importance of medication adherence Positive reinforcement given to patient for making efforts to improve her diabetes  Educated patient re: hypoglycemic events may occur with current [place] dosage once diabetes becomes better controlled Advised patient, providing education and rationale, to check cbg daily before meals  and record, calling PCP  for findings outside established parameters Discussed plans with patient for ongoing care coordination follow up and provided patient with direct contact information for nurse care coordinator Scheduled nurse follow up call for 07/07/23 @11 :30 AM Assessed patient's understanding of A1c goal: <7%  Lab Results  Component Value Date   HGBA1C 7.9 (H) 05/10/2023       Interventions Today    Flowsheet Row Most Recent Value  Chronic Disease   Chronic disease during today's visit Diabetes, Other  [UTI]  General Interventions   General Interventions Discussed/Reviewed General Interventions Discussed, General Interventions Reviewed, Doctor Visits, Labs  Doctor Visits Discussed/Reviewed Doctor  Visits Discussed, Doctor Visits Reviewed, PCP  Education Interventions   Education Provided Provided Education  Provided Verbal Education On When to see the doctor, Medication, Labs  Pharmacy Interventions   Pharmacy Dicussed/Reviewed Pharmacy Topics Discussed, Pharmacy Topics Reviewed, Medications and their functions          SDOH assessments and interventions completed:  No     Care Coordination Interventions:  Yes, provided   Follow up plan: Follow up call scheduled for 07/07/23 @11 :30 AM    Encounter Outcome:  Patient Visit Completed

## 2023-07-07 ENCOUNTER — Ambulatory Visit: Payer: Self-pay

## 2023-07-07 NOTE — Patient Instructions (Signed)
 Visit Information  Thank you for taking time to visit with me today. Please don't hesitate to contact me if I can be of assistance to you before our next scheduled appointment.  Our next appointment is by telephone on Wednesday, June 4 at 11:30 AM Please call the care guide team at [phone] if you need to cancel or reschedule your appointment.   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, with long-term current use of insulin        Problems:  Chronic Disease Management support and education needs related to DMII  Goal: Over the next 90 days the Patient will continue to work with Medical illustrator and/or Social Worker to address care management and care coordination needs related to DMII as evidenced by adherence to care management team scheduled appointments      Interventions:   Diabetes Interventions: Assessed patient's understanding of A1c goal: <7% Provided education to patient about basic DM disease process Assessed for symptoms of hypo/hyperglycemia Educated patient about daily glycemic goal, FBS 80-130, <180 after meals  Reviewed medications with patient and discussed importance of medication adherence, completed medication reconciliation with no discrepancies noted  Advised patient, providing education and rationale, to check cbg daily before meals and at bedtime and record, calling PCP for findings outside established parameters Discussed plans with patient for ongoing nurse care management follow up and provided patient with direct contact information for nurse case management  Lab Results  Component Value Date   HGBA1C 7.9 (H) 05/10/2023     Patient Self-Care Activities:  Attend all scheduled provider appointments Attend church or other social activities Call pharmacy for medication refills 3-7 days in advance of running out of medications Call provider office for new concerns or questions  Perform all self care activities independently  Perform IADL's (shopping, preparing meals, housekeeping, managing finances) independently Take medications as prescribed   Work with the nurse care manager to address care coordination needs and will continue to work with the clinical team to address health care and disease management related needs keep appointment with eye doctor check blood sugar at prescribed times: before meals and at bedtime and when you have symptoms of low or high blood sugar check feet daily for cuts, sores or redness take the blood sugar log to all doctor visits drink 6 to 8 glasses of water each day  Plan:  Next PCP appointment scheduled for: Thursday, July 3 at 12:00 PM Telephone follow up appointment with care management team member scheduled for:  Wednesday, June 4 at 11:30 AM             Please call 1-800-273-TALK (toll free, 24 hour hotline) if you are experiencing a Mental Health or Behavioral Health Crisis or need someone to talk to.  Patient verbalizes understanding of instructions and care plan provided today and agrees to view in MyChart. Active MyChart status and patient understanding of how to access instructions and care plan via MyChart confirmed with patient.     Dellar Fenton Dashel Goines RN  BSN CCM [facility]  [facility], [place] [facility] Health Nurse Care Coordinator  Direct Dial: [phone] Website: [email or web address]

## 2023-07-07 NOTE — Patient Outreach (Signed)
 Complex Care Management   Visit Note  07/07/2023  Name:  Jacqueline Bell MRN: [id number] DOB: May 12, 1950  Situation: Referral received for Complex Care Management related to Diabetes with Complications. I obtained verbal consent from Patient.  Visit completed with patient on the phone.  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     Mixed hyperlipidemia 12/04/2020   Positive self-administered antigen test for COVID-19 11/24/2021    Assessment: Patient Reported Symptoms:  Cognitive Cognitive Status: Alert and oriented to person, place, and time Cognitive/Intellectual Conditions Management [RPT]: None reported or documented in medical history or problem list   Health Maintenance Behaviors: Annual physical exam, Healthy diet, Social activities, Immunizations Healing Pattern: Average Health Facilitated by: Healthy diet, Prayer/meditation  Neurological Neurological Review of Symptoms: No symptoms reported    HEENT HEENT Symptoms Reported: Runny nose HEENT Conditions: Vision problem(s) Vision Problems:  (Diabetic Retinopathy) HEENT Management Strategies: Medication therapy, Routine screening HEENT Self-Management Outcome: 4 (good) Vision problem(s)  Cardiovascular Cardiovascular Symptoms Reported: No symptoms reported Does patient have uncontrolled Hypertension?: No Cardiovascular Conditions: Hypertension, High blood cholesterol Cardiovascular Management Strategies: Medication therapy, Routine screening Weight: 156 lb (70.8 kg) (patient reported) Cardiovascular Self-Management Outcome: 4 (good)  Respiratory Respiratory Symptoms Reported: No symptoms reported    Endocrine Patient reports the following symptoms related to hypoglycemia or hyperglycemia : No symptoms reported Is patient diabetic?: Yes Is patient checking blood sugars  at home?: Yes Endocrine Conditions: Diabetes Endocrine Management Strategies: Medication therapy, Diet modification, Routine screening Endocrine Self-Management Outcome: 4 (good)  Gastrointestinal Gastrointestinal Symptoms Reported: No symptoms reported   Nutrition Risk Screen (CP): No indicators present  Genitourinary Genitourinary Symptoms Reported: Frequency Genitourinary Conditions: Frequency, Urinary tract infection Genitourinary Management Strategies: Medication therapy Genitourinary Self-Management Outcome: 4 (good)  Integumentary Integumentary Symptoms Reported: No symptoms reported    Musculoskeletal Musculoskelatal Symptoms Reviewed: No symptoms reported   Falls in the past year?: No Number of falls in past year: 1 or less Was there an injury with Fall?: No Fall Risk Category Calculator: 0 Patient Fall Risk Level: Low Fall Risk Patient at Risk for Falls Due to: No Fall Risks Fall risk Follow up: Falls evaluation completed  Psychosocial Psychosocial Symptoms Reported: No symptoms reported   Major Change/Loss/Stressor/Fears (CP): Denies Techniques to Cope with Loss/Stress/Change: Spiritual practice(s) Quality of Family Relationships: involved, helpful, supportive Do you feel physically threatened by others?: No      07/07/2023   12:05 PM  Depression screen PHQ 2/9  Decreased Interest 0  Down, Depressed, Hopeless 0  PHQ - 2 Score 0    There were no vitals filed for this visit.  Medications Reviewed Today     Reviewed by Kaylene Pascal, RN (Registered Nurse) on 07/07/23 at 1219  Med List Status: <None>   Medication Order Taking? Sig Documenting Provider Last Dose Status Informant  atorvastatin  (LIPITOR) 10 MG tablet [id number] Yes Take 10 mg by mouth daily. [provider] Taking Active   atorvastatin  (LIPITOR) 20 MG tablet [id number]  TAKE 1 TABLET DAILY Susanna Epley, FNP  Consider Medication Status and Discontinue (Change in therapy)   Biotin 5000 MCG  CAPS [id number] Yes Take 5,000 mcg by mouth daily. [provider] Taking Active   cholecalciferol (VITAMIN D ) 1000 units tablet [id number] Yes Take 5,000 Units by mouth daily. [provider] Taking Active   Continuous  Glucose Sensor (FREESTYLE LIBRE 3 SENSOR) [place] [id number]  Place 1 sensor on the skin every 14 days. Use to check glucose continuously Susanna Epley, FNP  Active   empagliflozin  (JARDIANCE ) 25 MG TABS tablet [id number] Yes Take 1 tablet (25 mg total) by mouth daily before breakfast. Susanna Epley, FNP Taking Active   EPINEPHrine  0.3 mg/0.3 mL IJ SOAJ injection [id number]   [provider]  Active   ezetimibe  (ZETIA ) 10 MG tablet [id number] Yes TAKE 1 TABLET DAILY Susanna Epley, FNP Taking Active   fluticasone  (FLONASE ) 50 MCG/ACT nasal spray [id number] Yes SHAKE LIQUID AND USE 1 SPRAY IN EACH NOSTRIL DAILY Susanna Epley, FNP Taking Active   glucose blood test strip [id number]  Use as instructed to check blood sugar 3 times a day. Dx code E11.65 Moore, Janece, FNP  Active   insulin degludec  (TRESIBA  FLEXTOUCH) 100 UNIT/ML FlexTouch Pen [id number] Yes Inject 10 Units into the skin daily.  Patient taking differently: Inject 12 Units into the skin daily. Patient is taking 10-12 units   Susanna Epley, FNP Taking Active            Med Note (Cristin Szatkowski L   Wed Jul 07, 2023 12:18 PM)    loratadine  (CLARITIN ) 10 MG tablet [id number] Yes TAKE 1 TABLET DAILY Susanna Epley, FNP Taking Active   magnesium oxide (MAG-OX) 400 MG tablet [id number] Yes Take 400 mg by mouth daily. [provider] Taking Active Self  meloxicam  (MOBIC ) 15 MG tablet [id number] Yes TAKE 1 TABLET AS NEEDED Susanna Epley, FNP Taking Active            Med Note (Kaius Daino L   Wed Jul 07, 2023 12:19 PM)    Multiple Vitamin (MULTIVITAMIN) tablet [id number] Yes Take 1 tablet by mouth daily. [provider] Taking Active   Omega-3 Fatty Acids (FISH OIL PO) [id number] Yes Take 2 capsules by  mouth daily.  [provider] Taking Active   RYBELSUS  14 MG TABS [id number] Yes TAKE 1 TABLET DAILY Susanna Epley, FNP Taking Active   telmisartan  (MICARDIS ) 20 MG tablet [id number] Yes TAKE 1 TABLET DAILY Susanna Epley, FNP Taking Active             Recommendation:   PCP Follow-up  Follow Up Plan:   Telephone follow up appointment date/time:  Wednesday, June 4 at 11:30 AM  Louanne Roussel RN BSN CCM [place]  [facility], [facility] Health Nurse Care Coordinator  Direct Dial: [phone] Website: [email or web address]

## 2023-07-12 DIAGNOSIS — Z1231 Encounter for screening mammogram for malignant neoplasm of breast: Secondary | ICD-10-CM | POA: Diagnosis not present

## 2023-07-12 LAB — HM MAMMOGRAPHY

## 2023-07-15 ENCOUNTER — Encounter: Payer: Self-pay | Admitting: Internal Medicine

## 2023-08-11 ENCOUNTER — Other Ambulatory Visit: Payer: Self-pay

## 2023-08-11 NOTE — Patient Instructions (Signed)
 Visit Information  Thank you for taking time to visit with me today. Please don't hesitate to contact me if I can be of assistance to you before our next scheduled appointment.  Your next care management appointment is by telephone on Tuesday, July 8 at 12:30 PM  Please call the care guide team at [phone] if you need to cancel, schedule, or reschedule an appointment.   Please call 1-800-273-TALK (toll free, 24 hour hotline) if you are experiencing a Mental Health or Behavioral Health Crisis or need someone to talk to.  Louanne Roussel RN BSN CCM [place]  [facility], [facility] Health Nurse Care Coordinator  Direct Dial: [phone] Website: [email or web address]

## 2023-08-11 NOTE — Patient Outreach (Signed)
 Complex Care Management   Visit Note  08/11/2023  Name:  Jacqueline Bell MRN: [id number] DOB: Feb 13, 1951  Situation: Referral received for Complex Care Management related to Diabetes with Complications and Essential Hypertension, Hyperlipidemia. I obtained verbal consent from Patient.  Visit completed with patient on the phone.  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     Mixed hyperlipidemia 12/04/2020   Positive self-administered antigen test for COVID-19 11/24/2021    Assessment: Patient Reported Symptoms:  Cognitive Cognitive Status: Alert and oriented to person, place, and time Cognitive/Intellectual Conditions Management [RPT]: None reported or documented in medical history or problem list   Health Maintenance Behaviors: Healthy diet, Annual physical exam Health Facilitated by: Healthy diet  Neurological Neurological Review of Symptoms: No symptoms reported    HEENT HEENT Symptoms Reported: No symptoms reported      Cardiovascular Cardiovascular Symptoms Reported: No symptoms reported Does patient have uncontrolled Hypertension?: No Cardiovascular Conditions: High blood cholesterol, Hypertension Cardiovascular Management Strategies: Medication therapy, Routine screening, Diet modification Cardiovascular Self-Management Outcome: 4 (good)  Respiratory Respiratory Symptoms Reported: No symptoms reported Respiratory Conditions: Seasonal allergies Respiratory Self-Management Outcome: 4 (good)  Endocrine Patient reports the following symptoms related to hypoglycemia or hyperglycemia : No symptoms reported Is patient diabetic?: Yes Is patient checking blood sugars at home?: Yes Endocrine Conditions: Diabetes Endocrine Management Strategies: Medication therapy, Diet modification, Routine screening Endocrine Self-Management  Outcome: 4 (good)  Gastrointestinal Gastrointestinal Symptoms Reported: No symptoms reported      Genitourinary Genitourinary Symptoms Reported: No symptoms reported    Integumentary Integumentary Symptoms Reported: No symptoms reported    Musculoskeletal Musculoskelatal Symptoms Reviewed: No symptoms reported        Psychosocial Psychosocial Symptoms Reported: No symptoms reported            08/11/2023   11:40 AM  Depression screen PHQ 2/9  Decreased Interest 0  Down, Depressed, Hopeless 0  PHQ - 2 Score 0    There were no vitals filed for this visit.  Medications Reviewed Today     Reviewed by Kaylene Pascal, RN (Registered Nurse) on 08/11/23 at 1142  Med List Status: <None>   Medication Order Taking? Sig Documenting Provider Last Dose Status Informant  atorvastatin  (LIPITOR) 10 MG tablet [id number] No Take 10 mg by mouth daily. [provider] Taking Active   atorvastatin  (LIPITOR) 20 MG tablet [id number] No TAKE 1 TABLET DAILY Susanna Epley, FNP Taking Active   Biotin 5000 MCG CAPS [id number] No Take 5,000 mcg by mouth daily. [provider] Taking Active   cholecalciferol (VITAMIN D ) 1000 units tablet [id number] No Take 5,000 Units by mouth daily. [provider] Taking Active   Continuous Glucose Sensor (FREESTYLE LIBRE 3 SENSOR) [place] [id number] No Place 1 sensor on the skin every 14 days. Use to check glucose continuously Susanna Epley, FNP Taking Active   empagliflozin  (JARDIANCE ) 25 MG TABS tablet [id number] No Take 1 tablet (25 mg total) by mouth daily before breakfast. Susanna Epley, FNP Taking Active   EPINEPHrine  0.3 mg/0.3 mL IJ SOAJ injection [id number] No  [provider] Taking Active   ezetimibe  (ZETIA ) 10 MG tablet [id number] No TAKE 1 TABLET DAILY Susanna Epley, FNP Taking Active   fluticasone  (FLONASE ) 50 MCG/ACT nasal spray [id number] No SHAKE LIQUID AND USE 1 SPRAY IN EACH NOSTRIL DAILY Susanna Epley, FNP Taking  Active    glucose blood test strip [id number] No Use as instructed to check blood sugar 3 times a day. Dx code E11.65 Susanna Epley, FNP Taking Active   insulin degludec  (TRESIBA  [facility]) 100 UNIT/ML FlexTouch Pen [id number] No Inject 10 Units into the skin daily.  Patient taking differently: Inject 12 Units into the skin daily. Patient is taking 10-12 units   Susanna Epley, FNP Taking Active            Med Note (Asser Lucena L   Wed Jul 07, 2023 12:18 PM)    loratadine  (CLARITIN ) 10 MG tablet [id number] No TAKE 1 TABLET DAILY Susanna Epley, FNP Taking Active   magnesium oxide (MAG-OX) 400 MG tablet [id number] No Take 400 mg by mouth daily. [provider] Taking Active Self  meloxicam  (MOBIC ) 15 MG tablet [id number] No TAKE 1 TABLET AS NEEDED Susanna Epley, FNP Taking Active            Med Note (Jd Mccaster L   Wed Jul 07, 2023 12:19 PM)    Multiple Vitamin (MULTIVITAMIN) tablet [id number] No Take 1 tablet by mouth daily. [provider] Taking Active   Omega-3 Fatty Acids (FISH OIL PO) [id number] No Take 2 capsules by mouth daily.  [provider] Taking Active   RYBELSUS  14 MG TABS [id number] No TAKE 1 TABLET DAILY Susanna Epley, FNP Taking Active   telmisartan  (MICARDIS ) 20 MG tablet [id number] No TAKE 1 TABLET DAILY Susanna Epley, FNP Taking Active             Recommendation:   PCP Follow-up on July 3 at 12:00 PM  Follow Up Plan:   Telephone follow up appointment date/time:  Tuesday, July 8 at 12:30 PM  Louanne Roussel RN BSN CCM [place]  [facility], [facility] Health Nurse Care Coordinator  Direct Dial: [phone] Website: [email or web address]

## 2023-09-09 ENCOUNTER — Ambulatory Visit: Admitting: Nurse Practitioner

## 2023-09-09 VITALS — BP 140/72 | HR 76 | Temp 98.5°F | Ht 67.0 in | Wt 153.4 lb

## 2023-09-09 DIAGNOSIS — R61 Generalized hyperhidrosis: Secondary | ICD-10-CM

## 2023-09-09 DIAGNOSIS — E113392 Type 2 diabetes mellitus with moderate nonproliferative diabetic retinopathy without macular edema, left eye: Secondary | ICD-10-CM

## 2023-09-09 DIAGNOSIS — I10 Essential (primary) hypertension: Secondary | ICD-10-CM

## 2023-09-09 DIAGNOSIS — Z794 Long term (current) use of insulin: Secondary | ICD-10-CM

## 2023-09-09 DIAGNOSIS — E113312 Type 2 diabetes mellitus with moderate nonproliferative diabetic retinopathy with macular edema, left eye: Secondary | ICD-10-CM

## 2023-09-09 DIAGNOSIS — Z2821 Immunization not carried out because of patient refusal: Secondary | ICD-10-CM

## 2023-09-09 DIAGNOSIS — E782 Mixed hyperlipidemia: Secondary | ICD-10-CM

## 2023-09-09 NOTE — Assessment & Plan Note (Signed)
 Lipid panel taken today.  Patient encouraged to continue diet low in fat, high in protein and fibers.  Continue atorvastatin  10 mg PO daily.

## 2023-09-09 NOTE — Progress Notes (Signed)
 LILLETTE Kristeen JINNY Gladis, CMA,acting as a Neurosurgeon for [name], FNP.,have documented all relevant documentation on the behalf of [name], FNP,as directed by  [name], FNP while in the presence of [name], FNP.  Subjective:  Patient ID: Jacqueline Bell , female    DOB: 08-15-1950 , 73 y.o.   MRN: [id number]  Chief Complaint  Patient presents with   Hypertension    Patient presents today for a bp and dm follow up, Patient reports compliance with medication. Patient denies any chest pain, SOB, or headaches. Patient has no concerns today.    Diabetes   Night Sweats    Patient reports for the past month she has been having night sweats.     HPI  Patient presents for diabetes follow up, started rebelsus one year ago, no issues or concerns.    Eats small meal throughout day.  Breakfast  oatmeal and fruit, lunch is later in meat and vegetables or soup and sandwich, dinner is fruit, meat and vegetables.  She also juices daily, with tumeric, ginger, cinnamon, rosemary. Drinks 64 oz of water a day.  She is walking and going to [facility] for weight training 3 days a week.  She is interested in starting water aerobics but hasn't started that process at this time.    Takes CBG regularly with libre, highest readings 220's, normally 120's.    Checks feet for wounds, lesions a bedtime, ensures they are moisturized, doesn't walk around barefoot at home.    Patient continues to have night sweats, awaken from sleep with clothes soaked and wet.  This occurs weekly, she may go 3-4 days without having night sweats but it returns.  Patient advised to check blood sugar when this occurs.    Diabetes She presents for her follow-up diabetic visit. She has type 2 diabetes mellitus. Pertinent negatives for diabetes include no chest pain, no fatigue, no polydipsia, no polyphagia and no polyuria. Her weight is stable. She is following a diabetic, generally healthy and low salt diet. Meal planning includes avoidance  of concentrated sweets. She has not had a previous visit with a dietitian. She participates in exercise three times a week. She does not see a podiatrist.Eye exam is current.     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 No pertinent family history.   Current Outpatient Medications:    atorvastatin  (LIPITOR) 10 MG tablet, Take 10 mg by mouth daily., Disp: , Rfl:    Biotin 5000 MCG CAPS, Take 5,000 mcg by mouth daily., Disp: , Rfl:    cholecalciferol (VITAMIN D ) 1000 units tablet, Take 5,000 Units by mouth daily., Disp: , Rfl:    Continuous Glucose Sensor (FREESTYLE LIBRE 3 SENSOR) MISC, Place 1 sensor on the skin every 14 days. Use to check glucose continuously, Disp: 6 each, Rfl: 3   empagliflozin  (JARDIANCE ) 25 MG TABS tablet, Take 1 tablet (25 mg total) by mouth daily before breakfast., Disp: 90 tablet, Rfl: 3   EPINEPHrine  0.3 mg/0.3 mL IJ SOAJ injection, , Disp: , Rfl:    ezetimibe  (ZETIA ) 10 MG tablet, TAKE 1 TABLET DAILY, Disp: 90 tablet, Rfl: 3   fluticasone  (FLONASE ) 50 MCG/ACT nasal spray, SHAKE LIQUID AND USE 1 SPRAY IN EACH NOSTRIL  DAILY, Disp: 16 g, Rfl: 2   glucose blood test strip, Use as instructed to check blood sugar 3 times a day. Dx code E11.65, Disp: 100 each, Rfl: 12   insulin degludec  (TRESIBA  FLEXTOUCH) 100 UNIT/ML FlexTouch Pen, Inject 10 Units into the skin daily. (Patient taking differently: Inject 12 Units into the skin daily. Patient is taking 10-12 units), Disp: 15 mL, Rfl: 1   loratadine  (CLARITIN ) 10 MG tablet, TAKE 1 TABLET DAILY, Disp: 90 tablet, Rfl: 3   magnesium oxide (MAG-OX) 400 MG tablet, Take 400 mg by mouth daily., Disp: , Rfl:    Multiple Vitamin (MULTIVITAMIN) tablet, Take 1 tablet by mouth  daily., Disp: , Rfl:    Omega-3 Fatty Acids (FISH OIL PO), Take 2 capsules by mouth daily. , Disp: , Rfl:    RYBELSUS  14 MG TABS, TAKE 1 TABLET DAILY, Disp: 90 tablet, Rfl: 3   telmisartan  (MICARDIS ) 20 MG tablet, TAKE 1 TABLET DAILY, Disp: 90 tablet, Rfl: 3   meloxicam  (MOBIC ) 15 MG tablet, TAKE 1 TABLET AS NEEDED, Disp: 90 tablet, Rfl: 3   Allergies  Allergen Reactions   Iodine Hives   Shellfish Allergy  Hives   Tramadol Rash     Review of Systems  Constitutional:  Negative for chills, fatigue and fever.  Respiratory:  Negative for cough and shortness of breath.   Cardiovascular:  Negative for chest pain, palpitations and leg swelling.  Gastrointestinal:  Negative for abdominal pain, constipation, diarrhea, nausea and vomiting.  Endocrine: Negative for polydipsia, polyphagia and polyuria.     Today's Vitals   09/09/23 1223 09/09/23 1250  BP: (!) 140/70 (!) 140/72  Pulse: 76   Temp: 98.5 F (36.9 C)   TempSrc: Oral   Weight: 153 lb 6.4 oz (69.6 kg)   Height: 5' 7 (1.702 m)   PainSc: 0-No pain    Body mass index is 24.03 kg/m.  Wt Readings from Last 3 Encounters:  09/09/23 153 lb 6.4 oz (69.6 kg)  07/07/23 156 lb (70.8 kg)  05/10/23 160 lb (72.6 kg)      Objective:  Physical Exam Vitals and nursing note reviewed.  Constitutional:      General: She is not in acute distress.    Appearance: Normal appearance.  Cardiovascular:     Rate and Rhythm: Normal rate and regular rhythm.     Pulses: Normal pulses.     Heart sounds: Normal heart sounds. No murmur heard. Pulmonary:     Effort: Pulmonary effort is normal. No respiratory distress.     Breath sounds: Normal breath sounds. No wheezing.  Musculoskeletal:        General: No swelling or tenderness. Normal range of motion.     Right lower leg: No edema.     Left lower leg: No edema.  Skin:    General: Skin is warm and dry.     Capillary Refill: Capillary refill takes less than 2 seconds.     Coloration: Skin is  not jaundiced.     Findings: No bruising.  Neurological:     General: No focal deficit present.     Mental Status: She is alert and oriented to person, place, and time.     Cranial Nerves: No cranial nerve deficit.     Motor: No weakness.  Psychiatric:        Mood and Affect: Mood normal.        Behavior: Behavior normal.        Thought Content: Thought content normal.  Judgment: Judgment normal.         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 Continue current medications   Orders: -     BMP8+eGFR -     Hemoglobin A1c  Mixed hyperlipidemia Assessment & Plan: Lipid panel taken today.  Patient encouraged to continue diet low in fat, high in protein and fibers.  Continue atorvastatin  10 mg PO daily.    Orders: -     Lipid panel  Essential hypertension Assessment & Plan: Blood pressure is well control, continue current medications.    COVID-19 vaccination declined Assessment & Plan: Declines covid 19 vaccine. Discussed risk of covid 41 and if she changes her mind about the vaccine to call the office. Education has been provided regarding the importance of this vaccine but patient still declined. Advised may receive this vaccine at local pharmacy or Health Dept.or vaccine clinic. Aware to provide a copy of the vaccination record if obtained from local pharmacy or Health Dept.  Encouraged to take multivitamin, vitamin d , vitamin c and zinc to increase immune system. Aware can call office if would like to have vaccine here at office. Verbalized acceptance and understanding.    Night sweats Assessment & Plan: Will check TB Quantiferon and check a sleep study. This has been ongoing and have checked for metabolic causes.  Orders: -     QuantiFERON-TB Gold Plus -     Home sleep test    Return for  controlled DM check 4 months.  Patient was given opportunity to ask questions. Patient verbalized understanding of the plan and was able to repeat key elements of the plan. All questions were answered to their satisfaction.    LILLETTE [name], FNP, have reviewed all documentation for this visit. The documentation on 09/09/23 for the exam, diagnosis, procedures, and orders are all accurate and complete.   I have reviewed this encounter including the documentation in this note and/or discussed this patient with Delon Louder FNP Student. I am certifying that I agree with the content of this note as the primary care nurse practitioner.  [name], DNP, FNP-BC   IF YOU HAVE BEEN REFERRED TO A SPECIALIST, IT MAY TAKE 1-2 WEEKS TO SCHEDULE/PROCESS THE REFERRAL. IF YOU HAVE NOT HEARD FROM US /SPECIALIST IN TWO WEEKS, PLEASE GIVE US  A CALL AT [phone] X [phone].

## 2023-09-10 ENCOUNTER — Other Ambulatory Visit: Payer: Self-pay | Admitting: Nurse Practitioner

## 2023-09-14 ENCOUNTER — Telehealth: Payer: Self-pay

## 2023-09-14 LAB — QUANTIFERON-TB GOLD PLUS
QuantiFERON Mitogen Value: 5.51 [IU]/mL
QuantiFERON Nil Value: 0.24 [IU]/mL
QuantiFERON TB1 Ag Value: 0.27 [IU]/mL
QuantiFERON TB2 Ag Value: 0.31 [IU]/mL
QuantiFERON-TB Gold Plus: NEGATIVE

## 2023-09-14 LAB — BMP8+EGFR
BUN/Creatinine Ratio: 14 (ref 12–28)
BUN: 11 mg/dL (ref 8–27)
CO2: 23 mmol/L (ref 20–29)
Calcium: 9 mg/dL (ref 8.7–10.3)
Chloride: 102 mmol/L (ref 96–106)
Creatinine, Ser: 0.79 mg/dL (ref 0.57–1.00)
Glucose: 122 mg/dL — ABNORMAL HIGH (ref 70–99)
Potassium: 4.1 mmol/L (ref 3.5–5.2)
Sodium: 138 mmol/L (ref 134–144)
eGFR: 80 mL/min/1.73 (ref >59)

## 2023-09-14 LAB — LIPID PANEL
Chol/HDL Ratio: 2.6 ratio (ref 0.0–4.4)
Cholesterol, Total: 143 mg/dL (ref 100–199)
HDL: 54 mg/dL (ref >39)
LDL Chol Calc (NIH): 77 mg/dL (ref 0–99)
Triglycerides: 59 mg/dL (ref 0–149)
VLDL Cholesterol Cal: 12 mg/dL (ref 5–40)

## 2023-09-14 LAB — HEMOGLOBIN A1C
Est. average glucose Bld gHb Est-mCnc: 186 mg/dL
Hgb A1c MFr Bld: 8.1 % — ABNORMAL HIGH (ref 4.8–5.6)

## 2023-09-20 ENCOUNTER — Encounter: Payer: Self-pay | Admitting: Nurse Practitioner

## 2023-09-20 ENCOUNTER — Ambulatory Visit: Payer: Self-pay | Admitting: Nurse Practitioner

## 2023-09-20 DIAGNOSIS — R61 Generalized hyperhidrosis: Secondary | ICD-10-CM | POA: Insufficient documentation

## 2023-09-20 DIAGNOSIS — Z2821 Immunization not carried out because of patient refusal: Secondary | ICD-10-CM | POA: Insufficient documentation

## 2023-09-20 NOTE — Assessment & Plan Note (Signed)

## 2023-09-20 NOTE — Assessment & Plan Note (Signed)
 Will check TB Quantiferon and check a sleep study. This has been ongoing and have checked for metabolic causes.

## 2023-09-20 NOTE — Assessment & Plan Note (Signed)
 Chronic, stable.  Continue current medications.

## 2023-09-20 NOTE — Assessment & Plan Note (Signed)
 Blood pressure is well control, continue current medications.

## 2023-09-21 ENCOUNTER — Telehealth: Payer: Self-pay

## 2023-09-21 NOTE — Progress Notes (Signed)
 Complex Care Management Care Guide Note  09/21/2023 Name: Jacqueline Bell MRN: [id number] DOB: 1950-06-06  Jacqueline Bell is a 73 y.o. year old female who is a primary care patient of Georgina Speaks, FNP and is actively engaged with the care management team. I reached out to Jacqueline Bell by phone today to assist with re-scheduling  with the RN Case Manager.  Follow up plan: Unsuccessful telephone outreach attempt made. A HIPAA compliant phone message was left for the patient providing contact information and requesting a return call.  Jacqueline Bell [facility], [facility] Guide  Direct Dial: [phone]  Fax [phone]

## 2023-09-23 ENCOUNTER — Telehealth: Payer: Self-pay

## 2023-09-23 NOTE — Progress Notes (Signed)
 Complex Care Management Care Guide Note  09/23/2023 Name: Jacqueline Bell MRN: [id number] DOB: [birth year]  Jacqueline Bell is a 73 y.o. year old female who is a primary care patient of Georgina Speaks, FNP and is actively engaged with the care management team. I reached out to Jacqueline Bell by phone today to assist with re-scheduling  with the RN Case Manager.  Follow up plan: Unsuccessful telephone outreach attempt made. A HIPAA compliant phone message was left for the patient providing contact information and requesting a return call.  Leotis Rase [facility], [facility] Guide  Direct Dial: [phone]  Fax [phone]

## 2023-09-27 NOTE — Patient Outreach (Signed)
 Unable to reach patient for collaboration after 3 unsuccessful attempts to assess for goal outcome. Case Closed.  Clayborne Ly RN BSN CCM [place]  [facility], [facility] Nurse Care Coordinator  Direct Dial: [phone] Website: [email or web address]

## 2023-09-28 DIAGNOSIS — R0681 Apnea, not elsewhere classified: Secondary | ICD-10-CM | POA: Diagnosis not present

## 2023-09-29 ENCOUNTER — Other Ambulatory Visit: Payer: Self-pay | Admitting: Nurse Practitioner

## 2023-09-29 DIAGNOSIS — Z794 Long term (current) use of insulin: Secondary | ICD-10-CM

## 2023-09-29 MED ORDER — RYBELSUS 14 MG PO TABS: 1.0000 | ORAL_TABLET | Freq: Every day | ORAL | 3 refills | Status: DC

## 2023-09-29 NOTE — Telephone Encounter (Signed)
 Copied from CRM [id number]. Topic: Clinical - Medication Refill >> Sep 29, 2023  9:40 AM Carlyon D wrote: Medication: RYBELSUS  14 MG TABS, Pt said she is out of meds and needs this filled asap   Has the patient contacted their pharmacy? Yes (Agent: If no, request that the patient contact the pharmacy for the refill. If patient does not wish to contact the pharmacy document the reason why and proceed with request.) (Agent: If yes, when and what did the pharmacy advise?)  This is the patient's preferred pharmacy:    [facility] - [place], [place] - [address] [place] [address] Phone: [phone] Fax: [phone]    Is this the correct pharmacy for this prescription? Yes If no, delete pharmacy and type the correct one.   Has the prescription been filled recently? Yes  Is the patient out of the medication? Yes  Has the patient been seen for an appointment in the last year OR does the patient have an upcoming appointment? Yes  Can we respond through MyChart? Yes  Agent: Please be advised that Rx refills may take up to 3 business days. We ask that you follow-up with your pharmacy.

## 2023-10-14 DIAGNOSIS — H1045 Other chronic allergic conjunctivitis: Secondary | ICD-10-CM | POA: Diagnosis not present

## 2023-10-14 DIAGNOSIS — H04123 Dry eye syndrome of bilateral lacrimal glands: Secondary | ICD-10-CM | POA: Diagnosis not present

## 2023-10-14 DIAGNOSIS — H35372 Puckering of macula, left eye: Secondary | ICD-10-CM | POA: Diagnosis not present

## 2023-10-14 DIAGNOSIS — H26491 Other secondary cataract, right eye: Secondary | ICD-10-CM | POA: Diagnosis not present

## 2023-10-14 DIAGNOSIS — E113391 Type 2 diabetes mellitus with moderate nonproliferative diabetic retinopathy without macular edema, right eye: Secondary | ICD-10-CM | POA: Diagnosis not present

## 2023-10-14 DIAGNOSIS — E113312 Type 2 diabetes mellitus with moderate nonproliferative diabetic retinopathy with macular edema, left eye: Secondary | ICD-10-CM | POA: Diagnosis not present

## 2023-10-14 DIAGNOSIS — Z961 Presence of intraocular lens: Secondary | ICD-10-CM | POA: Diagnosis not present

## 2023-10-14 LAB — HM DIABETES EYE EXAM

## 2023-10-19 DIAGNOSIS — H35372 Puckering of macula, left eye: Secondary | ICD-10-CM | POA: Diagnosis not present

## 2023-10-19 DIAGNOSIS — H26491 Other secondary cataract, right eye: Secondary | ICD-10-CM | POA: Diagnosis not present

## 2023-10-19 DIAGNOSIS — H04123 Dry eye syndrome of bilateral lacrimal glands: Secondary | ICD-10-CM | POA: Diagnosis not present

## 2023-10-19 DIAGNOSIS — E113312 Type 2 diabetes mellitus with moderate nonproliferative diabetic retinopathy with macular edema, left eye: Secondary | ICD-10-CM | POA: Diagnosis not present

## 2023-10-19 DIAGNOSIS — H1045 Other chronic allergic conjunctivitis: Secondary | ICD-10-CM | POA: Diagnosis not present

## 2023-10-19 DIAGNOSIS — E113391 Type 2 diabetes mellitus with moderate nonproliferative diabetic retinopathy without macular edema, right eye: Secondary | ICD-10-CM | POA: Diagnosis not present

## 2023-11-03 ENCOUNTER — Telehealth: Payer: Self-pay

## 2023-11-03 ENCOUNTER — Other Ambulatory Visit: Payer: Self-pay

## 2023-11-03 ENCOUNTER — Ambulatory Visit: Payer: Medicare Other

## 2023-11-03 VITALS — BP 136/80 | HR 86 | Temp 98.7°F | Ht 67.0 in | Wt 153.2 lb

## 2023-11-03 DIAGNOSIS — Z0000 Encounter for general adult medical examination without abnormal findings: Secondary | ICD-10-CM | POA: Diagnosis not present

## 2023-11-03 MED ORDER — EPINEPHRINE 0.3 MG/0.3ML IJ SOAJ: 0.3000 mg | INTRAMUSCULAR | 1 refills | Status: AC | PRN

## 2023-11-03 NOTE — Progress Notes (Signed)
 Subjective:   Jacqueline Bell is a 73 y.o. who presents for a Medicare Wellness preventive visit.  As a reminder, Annual Wellness Visits don't include a physical exam, and some assessments may be limited, especially if this visit is performed virtually. We may recommend an in-person follow-up visit with your provider if needed.  Visit Complete: In person    Persons Participating in Visit: Patient.  AWV Questionnaire: No: Patient Medicare AWV questionnaire was not completed prior to this visit.  Cardiac Risk Factors include: advanced age (>31men, >78 women);diabetes mellitus;dyslipidemia     Objective:    Today's Vitals   11/03/23 1128 11/03/23 1145  BP: (!) 150/64 136/80  Pulse: 86   Temp: 98.7 F (37.1 C)   TempSrc: Oral   SpO2: 96%   Weight: 153 lb 3.2 oz (69.5 kg)   Height: 5' 7 (1.702 m)    Body mass index is 23.99 kg/m.     11/03/2023   11:36 AM 10/28/2022   11:36 AM 10/22/2021   10:41 AM 09/25/2020   10:50 AM 05/17/2019   11:14 AM 05/05/2018   11:50 AM 10/05/2016   10:27 AM  Advanced Directives  Does Patient Have a Medical Advance Directive? Yes Yes Yes Yes Yes No  No   Type of Estate agent of [place];Living will Healthcare Power of [place];Living will Healthcare Power of [place];Living will Healthcare Power of [place];Living will Living will    Copy of Healthcare Power of Attorney in Chart? No - copy requested  No - copy requested No - copy requested     Would patient like information on creating a medical advance directive?      Yes (MAU/Ambulatory/Procedural Areas - Information given)       Data saved with a previous flowsheet row definition    Current Medications (verified) Outpatient Encounter Medications as of 11/03/2023  Medication Sig   atorvastatin  (LIPITOR) 10 MG tablet Take 10 mg by mouth daily.   Biotin 5000 MCG CAPS Take 5,000 mcg by mouth daily.   cholecalciferol (VITAMIN D ) 1000 units tablet Take 5,000 Units by mouth  daily.   Continuous Glucose Sensor (FREESTYLE LIBRE 3 SENSOR) MISC Place 1 sensor on the skin every 14 days. Use to check glucose continuously   empagliflozin  (JARDIANCE ) 25 MG TABS tablet Take 1 tablet (25 mg total) by mouth daily before breakfast.   EPINEPHrine  0.3 mg/0.3 mL IJ SOAJ injection    ezetimibe  (ZETIA ) 10 MG tablet TAKE 1 TABLET DAILY   fluticasone  (FLONASE ) 50 MCG/ACT nasal spray SHAKE LIQUID AND USE 1 SPRAY IN EACH NOSTRIL DAILY   glucose blood test strip Use as instructed to check blood sugar 3 times a day. Dx code E11.65   insulin degludec  (TRESIBA  FLEXTOUCH) 100 UNIT/ML FlexTouch Pen Inject 10 Units into the skin daily. (Patient taking differently: Inject 12 Units into the skin daily. Patient is taking 10-12 units)   loratadine  (CLARITIN ) 10 MG tablet TAKE 1 TABLET DAILY   magnesium oxide (MAG-OX) 400 MG tablet Take 400 mg by mouth daily.   meloxicam  (MOBIC ) 15 MG tablet TAKE 1 TABLET AS NEEDED   Multiple Vitamin (MULTIVITAMIN) tablet Take 1 tablet by mouth daily.   Omega-3 Fatty Acids (FISH OIL PO) Take 2 capsules by mouth daily.    Semaglutide  (RYBELSUS ) 14 MG TABS Take 1 tablet (14 mg total) by mouth daily.   telmisartan  (MICARDIS ) 20 MG tablet TAKE 1 TABLET DAILY   No facility-administered encounter medications on file as of 11/03/2023.  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 No pertinent surgical history. History reviewed. No pertinent family history. Social History   Socioeconomic History   Marital status: Married    Spouse name: Not on file   Number of children: 4   Years of education: Not on file   Highest education level:  Associate degree: academic program  Occupational History   Not on file  Tobacco Use   Smoking status: Never   Smokeless tobacco: Never  Vaping Use   Vaping status: Never Used  Substance and Sexual Activity   Alcohol use: No   Drug use: No   Sexual activity: Yes  Other Topics Concern   Not on file  Social History Narrative   Patient reports no social determinants of health during today's screening. 3/12   Social Drivers of Health   Financial Resource Strain: Low Risk  (11/03/2023)   Overall Financial Resource Strain (CARDIA)    Difficulty of Paying Living Expenses: Not hard at all  Food Insecurity: No Food Insecurity (11/03/2023)   Hunger Vital Sign    Worried About Running Out of Food in the Last Year: Never true    Ran Out of Food in the Last Year: Never true  Transportation Needs: No Transportation Needs (11/03/2023)   PRAPARE - Administrator, Civil Service (Medical): No    Lack of Transportation (Non-Medical): No  Physical Activity: Insufficiently Active (11/03/2023)   Exercise Vital Sign    Days of Exercise per Week: 3 days    Minutes of Exercise per Session: 30 min  Stress: No Stress Concern Present (11/03/2023)   [employer] of Occupational Health - Occupational Stress Questionnaire    Feeling of Stress: Not at all  Social Connections: Socially Integrated (11/03/2023)   Social Connection and Isolation Panel    Frequency of Communication with Friends and Family: More than three times a week    Frequency of Social Gatherings with Friends and Family: More than three times a week    Attends Religious Services: More than 4 times per year    Active Member of [employer] or Organizations: Yes    Attends Engineer, structural: More than 4 times per year    Marital Status: Married    Tobacco Counseling Counseling given: Not Answered    Clinical Intake:  Pre-visit preparation completed: Yes  Pain : No/denies pain     Nutritional Status: BMI of  19-24  Normal Nutritional Risks: None Diabetes: Yes CBG done?: No Did pt. bring in CBG monitor from home?: No  Lab Results  Component Value Date   HGBA1C 8.1 (H) 09/09/2023   HGBA1C 7.9 (H) 05/10/2023   HGBA1C 7.4 (H) 01/07/2023     How often do you need to have someone help you when you read instructions, pamphlets, or other written materials from your doctor or pharmacy?: 1 - Never  Interpreter Needed?: No  Information entered by :: NAllen LPN   Activities of Daily Living     11/03/2023   11:30 AM  In your present state of health, do you have any difficulty performing the following activities:  Hearing? 0  Vision? 0  Difficulty concentrating or making  decisions? 0  Walking or climbing stairs? 0  Dressing or bathing? 0  Doing errands, shopping? 0  Preparing Food and eating ? N  Using the Toilet? N  In the past six months, have you accidently leaked urine? Y  Comment held too long  Do you have problems with loss of bowel control? N  Managing your Medications? N  Managing your Finances? N  Housekeeping or managing your Housekeeping? N    Patient Care Team: Georgina Speaks, FNP as PCP - General (General Practice) Octavia Bruckner, MD as Consulting Physician (Ophthalmology) Morgan Clayborne CROME, RN as [facility] Rudy Dorothyann DASEN, RPH-CPP (Pharmacist)  I have updated your Care Teams any recent Medical Services you may have received from other providers in the past year.     Assessment:   This is a routine wellness examination for Jacqueline Bell.  Hearing/Vision screen Hearing Screening - Comments:: Denies hearing issues  Vision Screening - Comments:: Regular eye exams, Dr. Octavia and Dr. Elner   Goals Addressed             This Visit's Progress    Patient Stated       11/03/2023, get A1C under control and get off diabetes medicine       Depression Screen     11/03/2023   11:37 AM 08/11/2023   11:40 AM 07/07/2023   12:05 PM  10/28/2022   11:37 AM 09/02/2022   11:46 AM 04/29/2022   12:10 PM 10/22/2021   10:42 AM  PHQ 2/9 Scores  PHQ - 2 Score 0 0 0 0 0 0 0  PHQ- 9 Score 3   0 0      Fall Risk     11/03/2023   11:37 AM 07/07/2023   12:06 PM 10/28/2022   11:37 AM 09/02/2022   11:46 AM 04/29/2022   12:10 PM  Fall Risk   Falls in the past year? 0 0 0 0 0  Number falls in past yr: 0 0 0 0   Injury with Fall? 0 0 0 0   Risk for fall due to : Medication side effect No Fall Risks Medication side effect No Fall Risks   Follow up Falls evaluation completed;Falls prevention discussed Falls evaluation completed Falls prevention discussed;Falls evaluation completed Falls evaluation completed     MEDICARE RISK AT HOME:  Medicare Risk at Home Any stairs in or around the home?: Yes If so, are there any without handrails?: No Home free of loose throw rugs in walkways, pet beds, electrical cords, etc?: Yes Adequate lighting in your home to reduce risk of falls?: Yes Life alert?: No Use of a cane, walker or w/c?: No Grab bars in the bathroom?: No Shower chair or bench in shower?: No Elevated toilet seat or a handicapped toilet?: No  TIMED UP AND GO:  Was the test performed?  Yes  Length of time to ambulate 10 feet: 5 sec Gait steady and fast without use of assistive device  Cognitive Function: 6CIT completed        11/03/2023   11:39 AM 10/28/2022   11:38 AM 10/22/2021   10:43 AM 09/25/2020   10:51 AM 05/17/2019   11:19 AM  6CIT Screen  What Year? 0 points 0 points 0 points 0 points 0 points  What month? 0 points 0 points 0 points 0 points 0 points  What time? 0 points 0 points 0 points 0 points 0 points  Count back from 20 0 points 0 points  0 points 0 points 0 points  Months in reverse 0 points 0 points 2 points 0 points 0 points  Repeat phrase 0 points 2 points 4 points 4 points 2 points  Total Score 0 points 2 points 6 points 4 points 2 points    Immunizations Immunization History  Administered Date(s)  Administered   Fluad Quad(high Dose 65+) 11/23/2019, 12/10/2021   Fluad Trivalent(High Dose 65+) 11/11/2022   INFLUENZA, HIGH DOSE SEASONAL PF 11/28/2018   IPV 08/28/1996   Influenza Whole 01/21/1998   Influenza-Unspecified 01/07/2001, 11/18/2017, 12/05/2020   PFIZER(Purple Top)SARS-COV-2 Vaccination 04/07/2019, 05/01/2019, 01/17/2020   PNEUMOCOCCAL CONJUGATE-20 08/28/2020   PPD Test 08/28/1996, 05/29/1998   Pfizer Covid-19 Vaccine Bivalent Booster 67yrs & up 01/24/2021   Td 04/14/1993   Typhoid Parenteral, AKD ([place]  [employer]) 04/14/1993    Screening Tests Health Maintenance  Topic Date Due   COVID-19 Vaccine (5 - 2024-25 season) 11/08/2022   INFLUENZA VACCINE  10/08/2023   Diabetic kidney evaluation - Urine ACR  11/11/2023   Zoster Vaccines- Shingrix  (1 of 2) 12/10/2023 (Originally 02/10/1970)   DTaP/Tdap/Td (2 - Tdap) 01/07/2024 (Originally 04/15/2003)   FOOT EXAM  11/11/2023   HEMOGLOBIN A1C  03/11/2024   MAMMOGRAM  07/11/2024   Diabetic kidney evaluation - eGFR measurement  09/08/2024   OPHTHALMOLOGY EXAM  10/13/2024   Medicare Annual Wellness (AWV)  11/02/2024   Colonoscopy  09/18/2028   Pneumococcal Vaccine: 50+ Years  Completed   DEXA SCAN  Completed   Hepatitis C Screening  Completed   HPV VACCINES  Aged Out   Meningococcal B Vaccine  Aged Out    Health Maintenance  Health Maintenance Due  Topic Date Due   COVID-19 Vaccine (5 - 2024-25 season) 11/08/2022   INFLUENZA VACCINE  10/08/2023   Diabetic kidney evaluation - Urine ACR  11/11/2023   Health Maintenance Items Addressed: Due for flu and covid vaccine.  Additional Screening:  Vision Screening: Recommended annual ophthalmology exams for early detection of glaucoma and other disorders of the eye. Would you like a referral to an eye doctor? No    Dental Screening: Recommended annual dental exams for proper oral hygiene  Community Resource Referral / Chronic Care Management: CRR required this visit?  No    CCM required this visit?  No   Plan:    I have personally reviewed and noted the following in the patient's chart:   Medical and social history Use of alcohol, tobacco or illicit drugs  Current medications and supplements including opioid prescriptions. Patient is not currently taking opioid prescriptions. Functional ability and status Nutritional status Physical activity Advanced directives List of other physicians Hospitalizations, surgeries, and ER visits in previous 12 months Vitals Screenings to include cognitive, depression, and falls Referrals and appointments  In addition, I have reviewed and discussed with patient certain preventive protocols, quality metrics, and best practice recommendations. A written personalized care plan for preventive services as well as general preventive health recommendations were provided to patient.   Ardella FORBES Dawn, LPN   1/72/7974   After Visit Summary: (In Person-Printed) AVS printed and given to the patient  Notes: Nothing significant to report at this time.

## 2023-11-03 NOTE — Patient Instructions (Signed)
 Ms. Bremner , Thank you for taking time out of your busy schedule to complete your Annual Wellness Visit with me. I enjoyed our conversation and look forward to speaking with you again next year. I, as well as your care team,  appreciate your ongoing commitment to your health goals. Please review the following plan we discussed and let me know if I can assist you in the future. Your Game plan/ To Do List    Referrals: If you haven't heard from the office you've been referred to, please reach out to them at the phone provided.   Follow up Visits: We will see or speak with you next year for your Next Medicare AWV with our clinical staff Have you seen your provider in the last 6 months (3 months if uncontrolled diabetes)? Yes  Clinician Recommendations:  Aim for 30 minutes of exercise or brisk walking, 6-8 glasses of water, and 5 servings of fruits and vegetables each day.       This is a list of the screenings recommended for you:  Health Maintenance  Topic Date Due   COVID-19 Vaccine (5 - 2024-25 season) 11/08/2022   Flu Shot  10/08/2023   Yearly kidney health urinalysis for diabetes  11/11/2023   Zoster (Shingles) Vaccine (1 of 2) 12/10/2023*   DTaP/Tdap/Td vaccine (2 - Tdap) 01/07/2024*   Complete foot exam   11/11/2023   Hemoglobin A1C  03/11/2024   Mammogram  07/11/2024   Yearly kidney function blood test for diabetes  09/08/2024   Eye exam for diabetics  10/13/2024   Medicare Annual Wellness Visit  11/02/2024   Colon Cancer Screening  09/18/2028   Pneumococcal Vaccine for age over 80  Completed   DEXA scan (bone density measurement)  Completed   Hepatitis C Screening  Completed   HPV Vaccine  Aged Out   Meningitis B Vaccine  Aged Out  *Topic was postponed. The date shown is not the original due date.    Advanced directives: (Copy Requested) Please bring a copy of your health care power of attorney and living will to the office to be added to your chart at your convenience. You  can mail to [facility] [address] [place], [place] [address] or email to [email or web address] Advance Care Planning is important because it:  [x]  Makes sure you receive the medical care that is consistent with your values, goals, and preferences  [x]  It provides guidance to your family and loved ones and reduces their decisional burden about whether or not they are making the right decisions based on your wishes.  Follow the link provided in your after visit summary or read over the paperwork we have mailed to you to help you started getting your Advance Directives in place. If you need assistance in completing these, please reach out to us  so that we can help you!  See attachments for Preventive Care and Fall Prevention Tips.

## 2023-11-03 NOTE — Telephone Encounter (Signed)
 Patient was identified as falling into the [place] Measure - Diabetes.   Patient was: Attribution and/or data issue.  Validation/Investigation needed.  Explanation:  Patient had A1c checked 09/09/2023 not due for recheck yet.

## 2023-11-17 ENCOUNTER — Other Ambulatory Visit: Payer: Self-pay | Admitting: Nurse Practitioner

## 2023-11-17 ENCOUNTER — Telehealth: Payer: Self-pay

## 2023-11-17 DIAGNOSIS — Z23 Encounter for immunization: Secondary | ICD-10-CM

## 2023-11-17 MED ORDER — COVID-19 MRNA VAC-TRIS(PFIZER) 30 MCG/0.3ML IM SUSY: 0.3000 mL | PREFILLED_SYRINGE | Freq: Once | INTRAMUSCULAR | 0 refills | Status: AC

## 2023-11-17 NOTE — Telephone Encounter (Signed)
 Copied from CRM #[id number]. Topic: Clinical - Medical Advice >> Nov 17, 2023 11:08 AM Donna BRAVO wrote: Reason for CRM: patient would like the COVID vaccine prescription sent to  [id number] - [place], [place] - [address] AT [facility] [address] [place] [address] Phone: [phone] Fax: [phone]

## 2023-11-17 NOTE — Telephone Encounter (Signed)
 Rx has been sent to pharmacy

## 2023-11-22 ENCOUNTER — Ambulatory Visit (INDEPENDENT_AMBULATORY_CARE_PROVIDER_SITE_OTHER): Payer: Self-pay

## 2023-11-22 VITALS — BP 124/60 | HR 77 | Temp 98.9°F | Ht 67.0 in | Wt 153.0 lb

## 2023-11-22 DIAGNOSIS — Z23 Encounter for immunization: Secondary | ICD-10-CM | POA: Diagnosis not present

## 2023-11-22 NOTE — Progress Notes (Addendum)
 Patient presents today for a flu vaccine. Patient received her flu vaccine in her left deltoid. Patient tolerated injection well.

## 2023-11-30 ENCOUNTER — Encounter: Payer: Self-pay | Admitting: Nurse Practitioner

## 2023-11-30 ENCOUNTER — Ambulatory Visit: Admitting: Nurse Practitioner

## 2023-11-30 VITALS — BP 130/70 | HR 97 | Temp 98.8°F | Ht 67.0 in | Wt 153.4 lb

## 2023-11-30 DIAGNOSIS — R0981 Nasal congestion: Secondary | ICD-10-CM | POA: Diagnosis not present

## 2023-11-30 DIAGNOSIS — I10 Essential (primary) hypertension: Secondary | ICD-10-CM | POA: Diagnosis not present

## 2023-11-30 DIAGNOSIS — R051 Acute cough: Secondary | ICD-10-CM

## 2023-11-30 DIAGNOSIS — H9201 Otalgia, right ear: Secondary | ICD-10-CM

## 2023-11-30 DIAGNOSIS — R0989 Other specified symptoms and signs involving the circulatory and respiratory systems: Secondary | ICD-10-CM | POA: Diagnosis not present

## 2023-11-30 MED ORDER — FLUCONAZOLE 100 MG PO TABS: 100.0000 mg | ORAL_TABLET | Freq: Every day | ORAL | 0 refills | Status: DC

## 2023-11-30 MED ORDER — BENZONATATE 100 MG PO CAPS: 100.0000 mg | ORAL_CAPSULE | Freq: Three times a day (TID) | ORAL | 1 refills | Status: AC | PRN

## 2023-11-30 MED ORDER — FLUTICASONE PROPIONATE 50 MCG/ACT NA SUSP: 2.0000 | Freq: Every day | NASAL | 2 refills | Status: DC

## 2023-11-30 MED ORDER — AMOXICILLIN-POT CLAVULANATE 875-125 MG PO TABS
1.0000 | ORAL_TABLET | Freq: Two times a day (BID) | ORAL | 0 refills | Status: AC
Start: 2023-11-30 — End: ?

## 2023-11-30 NOTE — Progress Notes (Signed)
 LILLETTE Kristeen JINNY Gladis, CMA,acting as a Neurosurgeon for [name], FNP.,have documented all relevant documentation on the behalf of [name], FNP,as directed by  [name], FNP while in the presence of [name], FNP.  Subjective:  Patient ID: Jacqueline Bell , female    DOB: 1950/10/21 , 73 y.o.   MRN: [id number]  Chief Complaint  Patient presents with   Cough    Patient presents today for cough, runny nose, congestion. That started on Saturday after getting her covid and flu shot earlier in the week.  Patient did covid test at home today and it was negative.   Discussed the use of AI scribe software for clinical note transcription with the patient, who gave verbal consent to proceed.  History of Present Illness Jacqueline Bell is a 73 year old female who presents with congestion, runny nose, and cough following recent COVID and flu vaccinations.  She has been experiencing congestion, runny nose, and a productive cough with slightly yellow mucus that began on Saturday after receiving COVID and flu vaccinations on Monday and Thursday of the previous week. Symptoms worsen at night and in crowded or indoor environments. No fever or chills are present.  She has been using Mucinex and Robitussin for symptom relief and takes an allergy  medication daily, typically at night. She also uses Flonase  nasal spray to manage nasal congestion. Despite these treatments, she continues to experience nasal congestion and a runny nose.  A COVID test was negative, and no one else around her is sick. She is concerned about her symptoms as she is planning to travel soon and does not want to be sick on a cruise.  No shortness of breath, chest pain, or sore throat, but the mucus feels stuck and requires effort to clear. She has a history of yeast infections with antibiotic use and is allergic to tramadol.  Her social history includes wearing a mask at work due to her runny nose and planning to travel to [place] and  then on a cruise.    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 No pertinent family history.   Current Outpatient Medications:    amoxicillin -clavulanate (AUGMENTIN ) 875-125 MG tablet, Take 1 tablet by mouth 2 (two) times daily., Disp: 14 tablet, Rfl: 0   atorvastatin  (LIPITOR) 10 MG tablet, Take 10 mg by mouth daily., Disp: , Rfl:    benzonatate  (TESSALON  PERLES) 100 MG capsule, Take 1 capsule (100 mg total) by mouth 3 (three) times daily as needed for cough., Disp: 30 capsule, Rfl: 1   Biotin 5000 MCG CAPS, Take 5,000 mcg by mouth daily., Disp: , Rfl:    cholecalciferol (VITAMIN D ) 1000 units tablet, Take 5,000 Units by mouth daily., Disp: , Rfl:    Continuous Glucose Sensor (FREESTYLE LIBRE 3 SENSOR) MISC, Place 1 sensor on the skin every 14 days. Use to check glucose continuously, Disp: 6 each, Rfl: 3   empagliflozin  (JARDIANCE ) 25 MG TABS tablet, Take 1 tablet (25 mg total) by mouth daily before breakfast., Disp: 90 tablet, Rfl: 3   EPINEPHrine  0.3 mg/0.3 mL IJ SOAJ injection, Inject 0.3 mg into the muscle as needed for anaphylaxis., Disp: 1 each, Rfl: 1   ezetimibe  (ZETIA ) 10 MG tablet, TAKE  1 TABLET DAILY, Disp: 90 tablet, Rfl: 3   fluconazole  (DIFLUCAN ) 100 MG tablet, Take 1 tablet (100 mg total) by mouth daily. Take 1 tablet by mouth now repeat in 5 days, Disp: 2 tablet, Rfl: 0   glucose blood test strip, Use as instructed to check blood sugar 3 times a day. Dx code E11.65, Disp: 100 each, Rfl: 12   insulin degludec  (TRESIBA  FLEXTOUCH) 100 UNIT/ML FlexTouch Pen, Inject 10 Units into the skin daily. (Patient taking differently: Inject 12 Units into the skin daily. Patient is taking 10-12 units), Disp: 15 mL, Rfl: 1    loratadine  (CLARITIN ) 10 MG tablet, TAKE 1 TABLET DAILY, Disp: 90 tablet, Rfl: 3   magnesium oxide (MAG-OX) 400 MG tablet, Take 400 mg by mouth daily., Disp: , Rfl:    meloxicam  (MOBIC ) 15 MG tablet, TAKE 1 TABLET AS NEEDED, Disp: 90 tablet, Rfl: 3   Multiple Vitamin (MULTIVITAMIN) tablet, Take 1 tablet by mouth daily., Disp: , Rfl:    Omega-3 Fatty Acids (FISH OIL PO), Take 2 capsules by mouth daily. , Disp: , Rfl:    Semaglutide  (RYBELSUS ) 14 MG TABS, Take 1 tablet (14 mg total) by mouth daily., Disp: 90 tablet, Rfl: 3   telmisartan  (MICARDIS ) 20 MG tablet, TAKE 1 TABLET DAILY, Disp: 90 tablet, Rfl: 3   fluticasone  (FLONASE ) 50 MCG/ACT nasal spray, Place 2 sprays into both nostrils daily., Disp: 16 g, Rfl: 2   Allergies  Allergen Reactions   Iodine Hives   Shellfish Allergy  Hives   Tramadol Rash     Review of Systems  Constitutional:  Negative for chills, fatigue and fever.  Respiratory:  Negative for cough and shortness of breath.   Cardiovascular:  Negative for chest pain, palpitations and leg swelling.  Gastrointestinal:  Negative for abdominal pain, constipation, diarrhea, nausea and vomiting.  Endocrine: Negative for polydipsia, polyphagia and polyuria.     Today's Vitals   11/30/23 1613  BP: 130/70  Pulse: 97  Temp: 98.8 F (37.1 C)  TempSrc: Oral  Weight: 153 lb 6.4 oz (69.6 kg)  Height: 5' 7 (1.702 m)  PainSc: 0-No pain   Body mass index is 24.03 kg/m.  Wt Readings from Last 3 Encounters:  11/30/23 153 lb 6.4 oz (69.6 kg)  11/22/23 153 lb (69.4 kg)  11/03/23 153 lb 3.2 oz (69.5 kg)      Objective:  Physical Exam Vitals and nursing note reviewed.  Constitutional:      General: She is not in acute distress.    Appearance: Normal appearance.  Cardiovascular:     Rate and Rhythm: Normal rate and regular rhythm.     Pulses: Normal pulses.     Heart sounds: Normal heart sounds. No murmur heard. Pulmonary:     Effort: Pulmonary effort is normal. No  respiratory distress.     Breath sounds: Normal breath sounds. No wheezing.  Musculoskeletal:        General: No swelling or tenderness. Normal range of motion.     Right lower leg: No edema.     Left lower leg: No edema.  Skin:    General: Skin is warm and dry.     Capillary Refill: Capillary refill takes less than 2 seconds.     Coloration: Skin is not jaundiced.     Findings: No bruising.  Neurological:     General: No focal deficit present.     Mental Status: She is alert and oriented to person, place, and time.  Cranial Nerves: No cranial nerve deficit.     Motor: No weakness.  Psychiatric:        Mood and Affect: Mood normal.        Behavior: Behavior normal.        Thought Content: Thought content normal.        Judgment: Judgment normal.         Assessment And Plan:  Congestion of nasal sinus Assessment & Plan: Acute upper respiratory infection with cough and nasal congestion Symptoms likely due to vaccine reaction or viral etiology. Bacterial infection considered due to travel. - Prescribed Augmentin  BID for 7 days. - Prescribed Tessalon  Perles for cough. - Prescribed Flonase  for congestion. - Prescribed yeast infection prophylaxis due to antibiotics.  Orders: -     Amoxicillin -Pot Clavulanate; Take 1 tablet by mouth 2 (two) times daily.  Dispense: 14 tablet; Refill: 0  Runny nose -     Fluticasone  Propionate; Place 2 sprays into both nostrils daily.  Dispense: 16 g; Refill: 2 -     Amoxicillin -Pot Clavulanate; Take 1 tablet by mouth 2 (two) times daily.  Dispense: 14 tablet; Refill: 0  Acute cough -     Amoxicillin -Pot Clavulanate; Take 1 tablet by mouth 2 (two) times daily.  Dispense: 14 tablet; Refill: 0 -     Benzonatate ; Take 1 capsule (100 mg total) by mouth 3 (three) times daily as needed for cough.  Dispense: 30 capsule; Refill: 1  Right ear pain Assessment & Plan: Erythema noted to right ear canal, will treat with antibiotic if related to ear  infection  Orders: -     Amoxicillin -Pot Clavulanate; Take 1 tablet by mouth 2 (two) times daily.  Dispense: 14 tablet; Refill: 0  Essential hypertension Assessment & Plan: Blood pressure is well control, continue current medications. She is to use HBP coricidan for her cold symptoms   Other orders -     Fluconazole ; Take 1 tablet (100 mg total) by mouth daily. Take 1 tablet by mouth now repeat in 5 days  Dispense: 2 tablet; Refill: 0     Return for keep same next.  Patient was given opportunity to ask questions. Patient verbalized understanding of the plan and was able to repeat key elements of the plan. All questions were answered to their satisfaction.    LILLETTE [name], FNP, have reviewed all documentation for this visit. The documentation on 11/30/23 for the exam, diagnosis, procedures, and orders are all accurate and complete.   IF YOU HAVE BEEN REFERRED TO A SPECIALIST, IT MAY TAKE 1-2 WEEKS TO SCHEDULE/PROCESS THE REFERRAL. IF YOU HAVE NOT HEARD FROM US /SPECIALIST IN TWO WEEKS, PLEASE GIVE US  A CALL AT [phone].

## 2023-11-30 NOTE — Patient Instructions (Signed)
 You can take HBP coricidan brand medications.

## 2023-12-06 DIAGNOSIS — R0981 Nasal congestion: Secondary | ICD-10-CM | POA: Insufficient documentation

## 2023-12-06 DIAGNOSIS — H9201 Otalgia, right ear: Secondary | ICD-10-CM | POA: Insufficient documentation

## 2023-12-06 NOTE — Assessment & Plan Note (Addendum)
 Blood pressure is well control, continue current medications. She is to use HBP coricidan for her cold symptoms

## 2023-12-06 NOTE — Assessment & Plan Note (Signed)
 Acute upper respiratory infection with cough and nasal congestion Symptoms likely due to vaccine reaction or viral etiology. Bacterial infection considered due to travel. - Prescribed Augmentin  BID for 7 days. - Prescribed Tessalon  Perles for cough. - Prescribed Flonase  for congestion. - Prescribed yeast infection prophylaxis due to antibiotics.

## 2023-12-06 NOTE — Assessment & Plan Note (Signed)
 Erythema noted to right ear canal, will treat with antibiotic if related to ear infection

## 2023-12-17 ENCOUNTER — Other Ambulatory Visit: Payer: Self-pay | Admitting: Nurse Practitioner

## 2023-12-17 DIAGNOSIS — E782 Mixed hyperlipidemia: Secondary | ICD-10-CM

## 2023-12-20 DIAGNOSIS — H04123 Dry eye syndrome of bilateral lacrimal glands: Secondary | ICD-10-CM | POA: Diagnosis not present

## 2023-12-20 DIAGNOSIS — H35372 Puckering of macula, left eye: Secondary | ICD-10-CM | POA: Diagnosis not present

## 2023-12-20 DIAGNOSIS — H1045 Other chronic allergic conjunctivitis: Secondary | ICD-10-CM | POA: Diagnosis not present

## 2023-12-20 DIAGNOSIS — E113391 Type 2 diabetes mellitus with moderate nonproliferative diabetic retinopathy without macular edema, right eye: Secondary | ICD-10-CM | POA: Diagnosis not present

## 2023-12-20 DIAGNOSIS — E113312 Type 2 diabetes mellitus with moderate nonproliferative diabetic retinopathy with macular edema, left eye: Secondary | ICD-10-CM | POA: Diagnosis not present

## 2024-01-11 ENCOUNTER — Ambulatory Visit: Payer: Self-pay | Admitting: Nurse Practitioner

## 2024-01-17 ENCOUNTER — Other Ambulatory Visit: Payer: Self-pay

## 2024-01-17 DIAGNOSIS — R0989 Other specified symptoms and signs involving the circulatory and respiratory systems: Secondary | ICD-10-CM

## 2024-01-17 MED ORDER — FLUTICASONE PROPIONATE 50 MCG/ACT NA SUSP
2.0000 | Freq: Every day | NASAL | 2 refills | Status: DC
Start: 2024-01-17 — End: 2024-01-31

## 2024-01-18 ENCOUNTER — Encounter: Payer: Self-pay | Admitting: Nurse Practitioner

## 2024-01-18 ENCOUNTER — Ambulatory Visit (INDEPENDENT_AMBULATORY_CARE_PROVIDER_SITE_OTHER): Payer: Self-pay | Admitting: Nurse Practitioner

## 2024-01-18 VITALS — BP 130/60 | HR 68 | Temp 98.2°F | Ht 67.0 in | Wt 152.0 lb

## 2024-01-18 DIAGNOSIS — Z79899 Other long term (current) drug therapy: Secondary | ICD-10-CM | POA: Diagnosis not present

## 2024-01-18 DIAGNOSIS — Z794 Long term (current) use of insulin: Secondary | ICD-10-CM

## 2024-01-18 DIAGNOSIS — I10 Essential (primary) hypertension: Secondary | ICD-10-CM

## 2024-01-18 DIAGNOSIS — E113312 Type 2 diabetes mellitus with moderate nonproliferative diabetic retinopathy with macular edema, left eye: Secondary | ICD-10-CM | POA: Diagnosis not present

## 2024-01-18 DIAGNOSIS — E782 Mixed hyperlipidemia: Secondary | ICD-10-CM

## 2024-01-18 LAB — POCT URINALYSIS DIP (CLINITEK)
Bilirubin, UA: NEGATIVE
Blood, UA: NEGATIVE
Glucose, UA: >=1000 mg/dL — AB
Ketones, POC UA: NEGATIVE mg/dL
Leukocytes, UA: NEGATIVE
Nitrite, UA: POSITIVE — AB
POC PROTEIN,UA: NEGATIVE
Spec Grav, UA: 1.01 (ref 1.010–1.025)
Urobilinogen, UA: 0.2 U/dL
pH, UA: 7.5 (ref 5.0–8.0)

## 2024-01-18 MED ORDER — TRESIBA FLEXTOUCH 100 UNIT/ML ~~LOC~~ SOPN: 12.0000 [IU] | PEN_INJECTOR | Freq: Every day | SUBCUTANEOUS | 1 refills | Status: AC

## 2024-01-18 MED ORDER — TELMISARTAN 20 MG PO TABS: 20.0000 mg | ORAL_TABLET | Freq: Every day | ORAL | 3 refills | Status: AC

## 2024-01-18 NOTE — Progress Notes (Signed)
 I,Jacqueline Bell, CMA,acting as a neurosurgeon for [employer], FNP.,have documented all relevant documentation on the behalf of Jacqueline Ada, FNP,as directed by  Jacqueline Ada, FNP while in the presence of Jacqueline Ada, FNP.  Subjective:  Patient ID: EDEE Bell , female    DOB: 06/08/1950 , 73 y.o.   MRN: [id number]  Chief Complaint  Patient presents with   Hypertension    Patient presents today for a bpc. Patient reports compliance with her meds. Patient denies having chest pain,sob or headaches at this time.    Diabetes    HPI Discussed the use of AI scribe software for clinical note transcription with the patient, who gave verbal consent to proceed.  History of Present Illness Jacqueline Bell is a 73 year old female with diabetes who presents for a follow-up visit.  She is currently taking Tresiba , 12 units, which she receives through the [facility] and [facility]. She mentions a possible need for a refill on her telmisartan , which was last filled in August of the previous year.  Her blood sugar levels have been mostly stable, with occasional readings of 200-230 mg/dL, attributed to recent eating or drinking. Her blood sugar was 98 mg/dL upon arrival today, and she has been fasting. She regularly checks her blood sugar levels.  She received a flu shot at the clinic and a COVID vaccine at a pharmacy in September before going on a cruise. She has not yet received the shingles vaccine.  She mentions needing a pedicure since her last one was in September before her cruise.  Her husband has been away for a week and a half, and she has been spending time with a friend whose husband recently had back surgery.  Hypertension This is a chronic problem. The current episode started more than 1 year ago. The problem is controlled. Pertinent negatives include no chest pain. There are no associated agents to hypertension. Risk factors for coronary artery disease include sedentary lifestyle and  diabetes mellitus. Past treatments include angiotensin blockers. Compliance problems include exercise.  Hypertensive end-organ damage includes retinopathy. There is no history of angina.  Diabetes She presents for her follow-up diabetic visit. She has type 2 diabetes mellitus. Pertinent negatives for diabetes include no chest pain, no polydipsia, no polyphagia and no polyuria. There are no hypoglycemic complications. Diabetic complications include retinopathy. Risk factors for coronary artery disease include sedentary lifestyle, diabetes mellitus and dyslipidemia. Current diabetic treatment includes oral agent (dual therapy). She is compliant with treatment all of the time. Her weight is stable. She is following a diabetic, generally healthy and low salt diet. Meal planning includes avoidance of concentrated sweets. She has not had a previous visit with a dietitian. She participates in exercise three times a week. (Blood sugar has been as high as 200 after eating or drinking. Blood sugar today was 98. She has been fasting. ) She does not see a podiatrist.Eye exam is current.     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 No pertinent family history.   Current Outpatient Medications:    amoxicillin -clavulanate (AUGMENTIN ) 875-125 MG tablet, Take 1 tablet by mouth 2 (two) times daily., Disp: 14 tablet, Rfl: 0   atorvastatin  (LIPITOR) 10 MG tablet, Take  10 mg by mouth daily., Disp: , Rfl:    atorvastatin  (LIPITOR) 20 MG tablet, TAKE 1 TABLET DAILY, Disp: 90 tablet, Rfl: 3   benzonatate  (TESSALON  PERLES) 100 MG capsule, Take 1 capsule (100 mg total) by mouth 3 (three) times daily as needed for cough., Disp: 30 capsule, Rfl: 1   Biotin 5000 MCG  CAPS, Take 5,000 mcg by mouth daily., Disp: , Rfl:    cholecalciferol (VITAMIN D ) 1000 units tablet, Take 5,000 Units by mouth daily., Disp: , Rfl:    Continuous Glucose Sensor (FREESTYLE LIBRE 3 SENSOR) MISC, Place 1 sensor on the skin every 14 days. Use to check glucose continuously, Disp: 6 each, Rfl: 3   EPINEPHrine  0.3 mg/0.3 mL IJ SOAJ injection, Inject 0.3 mg into the muscle as needed for anaphylaxis., Disp: 1 each, Rfl: 1   ezetimibe  (ZETIA ) 10 MG tablet, TAKE 1 TABLET DAILY, Disp: 90 tablet, Rfl: 3   fluconazole  (DIFLUCAN ) 100 MG tablet, Take 1 tablet (100 mg total) by mouth daily. Take 1 tablet by mouth now repeat in 5 days, Disp: 2 tablet, Rfl: 0   fluticasone  (FLONASE ) 50 MCG/ACT nasal spray, Place 2 sprays into both nostrils daily., Disp: 16 g, Rfl: 2   glucose blood test strip, Use as instructed to check blood sugar 3 times a day. Dx code E11.65, Disp: 100 each, Rfl: 12   JARDIANCE  25 MG TABS tablet, TAKE 1 TABLET DAILY BEFORE BREAKFAST, Disp: 90 tablet, Rfl: 3   loratadine  (CLARITIN ) 10 MG tablet, TAKE 1 TABLET DAILY, Disp: 90 tablet, Rfl: 3   magnesium oxide (MAG-OX) 400 MG tablet, Take 400 mg by mouth daily., Disp: , Rfl:    meloxicam  (MOBIC ) 15 MG tablet, TAKE 1 TABLET AS NEEDED, Disp: 90 tablet, Rfl: 3   Multiple Vitamin (MULTIVITAMIN) tablet, Take 1 tablet by mouth daily., Disp: , Rfl:    Omega-3 Fatty Acids (FISH OIL PO), Take 2 capsules by mouth daily. , Disp: , Rfl:    Semaglutide  (RYBELSUS ) 14 MG TABS, Take 1 tablet (14 mg total) by mouth daily., Disp: 90 tablet, Rfl: 3   insulin degludec  (TRESIBA  FLEXTOUCH) 100 UNIT/ML FlexTouch Pen, Inject 12 Units into the skin daily., Disp: 15 mL, Rfl: 1   telmisartan  (MICARDIS ) 20 MG tablet, Take 1 tablet (20 mg total) by mouth daily., Disp: 90 tablet, Rfl: 3   Allergies  Allergen Reactions   Iodine Hives   Shellfish Allergy  Hives   Tramadol Rash     Review of Systems  Constitutional: Negative.   Eyes: Negative.   Respiratory:  Negative.    Cardiovascular:  Negative for chest pain.  Endocrine: Negative for polydipsia, polyphagia and polyuria.     Today's Vitals   01/18/24 1503  BP: 130/60  Pulse: 68  Temp: 98.2 F (36.8 C)  TempSrc: Oral  Weight: 152 lb (68.9 kg)  Height: 5' 7 (1.702 m)  PainSc: 0-No pain   Body mass index is 23.81 kg/m.  Wt Readings from Last 3 Encounters:  01/18/24 152 lb (68.9 kg)  11/30/23 153 lb 6.4 oz (69.6 kg)  11/22/23 153 lb (69.4 kg)     Objective:  Physical Exam Vitals and nursing note reviewed.  Constitutional:      General: She is not in acute distress.    Appearance: Normal appearance.  Cardiovascular:     Rate and Rhythm: Normal rate and regular rhythm.     Pulses: Normal pulses.     Heart sounds: Normal heart sounds. No murmur heard. Pulmonary:  Effort: Pulmonary effort is normal. No respiratory distress.     Breath sounds: Normal breath sounds. No wheezing.  Musculoskeletal:        General: No swelling or tenderness. Normal range of motion.     Right lower leg: No edema.     Left lower leg: No edema.  Skin:    General: Skin is warm and dry.     Capillary Refill: Capillary refill takes less than 2 seconds.     Coloration: Skin is not jaundiced.     Findings: No bruising.  Neurological:     General: No focal deficit present.     Mental Status: She is alert and oriented to person, place, and time.     Cranial Nerves: No cranial nerve deficit.     Motor: No weakness.  Psychiatric:        Mood and Affect: Mood normal.        Behavior: Behavior normal.        Thought Content: Thought content normal.        Judgment: Judgment normal.     Diabetic foot exam was performed with the following findings:   No deformities, ulcerations, or other skin breakdown Normal sensation of 10g monofilament Intact posterior tibialis and dorsalis pedis pulses     Assessment And Plan:   Assessment & Plan Mixed hyperlipidemia Cholesterol well-managed with  atorvastatin  and ezetimibe . LDL target <70 due to diabetes. - Continue current lipid-lowering therapy. 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 Awaiting current A1c. Insulin degludec  at 12 units daily, no refill issues reported. - Checked A1c level. - Ensured insulin degludec  prescription is up to date. Essential hypertension Blood pressure managed with telmisartan . Prescription expired. - Renewed telmisartan  prescription.   Flu and COVID vaccinations received in September. Shingles vaccination pending, discussed timing. - Administer shingles vaccination two weeks after last COVID and flu vaccinations. Orders Placed This Encounter  Procedures   CBC   CMP14+EGFR   Hemoglobin A1c   Lipid panel   Microalbumin / Creatinine Urine Ratio   POCT URINALYSIS DIP (CLINITEK)    Return for controlled DM check-4 months.  Patient was given opportunity to ask questions. Patient verbalized understanding of the plan and was able to repeat key elements of the plan. All questions were answered to their satisfaction.    LILLETTE Jacqueline Ada, FNP, have reviewed all documentation for this visit. The documentation on 01/18/24 for the exam, diagnosis, procedures, and orders are all accurate and complete.   IF YOU HAVE BEEN REFERRED TO A SPECIALIST, IT MAY TAKE 1-2 WEEKS TO SCHEDULE/PROCESS THE REFERRAL. IF YOU HAVE NOT HEARD FROM US /SPECIALIST IN TWO WEEKS, PLEASE GIVE US  A CALL AT [phone].

## 2024-01-18 NOTE — Patient Instructions (Signed)
 Hypertension, Adult Hypertension is another name for high blood pressure. High blood pressure forces your heart to work harder to pump blood. This can cause problems over time. There are two numbers in a blood pressure reading. There is a top number (systolic) over a bottom number (diastolic). It is best to have a blood pressure that is below 120/80. What are the causes? The cause of this condition is not known. Some other conditions can lead to high blood pressure. What increases the risk? Some lifestyle factors can make you more likely to develop high blood pressure: Smoking. Not getting enough exercise or physical activity. Being overweight. Having too much fat, sugar, calories, or salt (sodium) in your diet. Drinking too much alcohol. Other risk factors include: Having any of these conditions: Heart disease. Diabetes. High cholesterol. Kidney disease. Obstructive sleep apnea. Having a family history of high blood pressure and high cholesterol. Age. The risk increases with age. Stress. What are the signs or symptoms? High blood pressure may not cause symptoms. Very high blood pressure (hypertensive crisis) may cause: Headache. Fast or uneven heartbeats (palpitations). Shortness of breath. Nosebleed. Vomiting or feeling like you may vomit (nauseous). Changes in how you see. Very bad chest pain. Feeling dizzy. Seizures. How is this treated? This condition is treated by making healthy lifestyle changes, such as: Eating healthy foods. Exercising more. Drinking less alcohol. Your doctor may prescribe medicine if lifestyle changes do not help enough and if: Your top number is above 130. Your bottom number is above 80. Your personal target blood pressure may vary. Follow these instructions at home: Eating and drinking  If told, follow the DASH eating plan. To follow this plan: Fill one half of your plate at each meal with fruits and vegetables. Fill one fourth of your plate  at each meal with whole grains. Whole grains include whole-wheat pasta, brown rice, and whole-grain bread. Eat or drink low-fat dairy products, such as skim milk or low-fat yogurt. Fill one fourth of your plate at each meal with low-fat (lean) proteins. Low-fat proteins include fish, chicken without skin, eggs, beans, and tofu. Avoid fatty meat, cured and processed meat, or chicken with skin. Avoid pre-made or processed food. Limit the amount of salt in your diet to less than 1,500 mg each day. Do not drink alcohol if: Your doctor tells you not to drink. You are pregnant, may be pregnant, or are planning to become pregnant. If you drink alcohol: Limit how much you have to: 0-1 drink a day for women. 0-2 drinks a day for men. Know how much alcohol is in your drink. In the [place] one drink equals one 12 oz bottle of beer (355 mL), one 5 oz glass of wine (148 mL), or one 1 oz glass of hard liquor (44 mL). Lifestyle  Work with your doctor to stay at a healthy weight or to lose weight. Ask your doctor what the best weight is for you. Get at least 30 minutes of exercise that causes your heart to beat faster (aerobic exercise) most days of the week. This may include walking, swimming, or biking. Get at least 30 minutes of exercise that strengthens your muscles (resistance exercise) at least 3 days a week. This may include lifting weights or doing Pilates. Do not smoke or use any products that contain nicotine or tobacco. If you need help quitting, ask your doctor. Check your blood pressure at home as told by your doctor. Keep all follow-up visits. Medicines Take over-the-counter and prescription medicines  only as told by your doctor. Follow directions carefully. Do not skip doses of blood pressure medicine. The medicine does not work as well if you skip doses. Skipping doses also puts you at risk for problems. Ask your doctor about side effects or reactions to medicines that you should watch  for. Contact a doctor if: You think you are having a reaction to the medicine you are taking. You have headaches that keep coming back. You feel dizzy. You have swelling in your ankles. You have trouble with your vision. Get help right away if: You get a very bad headache. You start to feel mixed up (confused). You feel weak or numb. You feel faint. You have very bad pain in your: Chest. Belly (abdomen). You vomit more than once. You have trouble breathing. These symptoms may be an emergency. Get help right away. Call 911. Do not wait to see if the symptoms will go away. Do not drive yourself to the hospital. Summary Hypertension is another name for high blood pressure. High blood pressure forces your heart to work harder to pump blood. For most people, a normal blood pressure is less than 120/80. Making healthy choices can help lower blood pressure. If your blood pressure does not get lower with healthy choices, you may need to take medicine. This information is not intended to replace advice given to you by your health care provider. Make sure you discuss any questions you have with your health care provider. Document Revised: 12/12/2020 Document Reviewed: 12/12/2020 Elsevier Patient Education  2024 [employer].

## 2024-01-19 ENCOUNTER — Encounter: Payer: Self-pay | Admitting: Nurse Practitioner

## 2024-01-19 LAB — LIPID PANEL
Chol/HDL Ratio: 2.4 ratio (ref 0.0–4.4)
Cholesterol, Total: 149 mg/dL (ref 100–199)
HDL: 63 mg/dL (ref >39)
LDL Chol Calc (NIH): 73 mg/dL (ref 0–99)
Triglycerides: 63 mg/dL (ref 0–149)
VLDL Cholesterol Cal: 13 mg/dL (ref 5–40)

## 2024-01-19 LAB — CMP14+EGFR
ALT: 14 IU/L (ref 0–32)
AST: 26 IU/L (ref 0–40)
Albumin: 4.5 g/dL (ref 3.8–4.8)
Alkaline Phosphatase: 61 IU/L (ref 49–135)
BUN/Creatinine Ratio: 14 (ref 12–28)
BUN: 10 mg/dL (ref 8–27)
Bilirubin Total: 0.6 mg/dL (ref 0.0–1.2)
CO2: 26 mmol/L (ref 20–29)
Calcium: 9.4 mg/dL (ref 8.7–10.3)
Chloride: 99 mmol/L (ref 96–106)
Creatinine, Ser: 0.7 mg/dL (ref 0.57–1.00)
Globulin, Total: 3.1 g/dL (ref 1.5–4.5)
Glucose: 89 mg/dL (ref 70–99)
Potassium: 3.9 mmol/L (ref 3.5–5.2)
Sodium: 139 mmol/L (ref 134–144)
Total Protein: 7.6 g/dL (ref 6.0–8.5)
eGFR: 92 mL/min/1.73 (ref >59)

## 2024-01-19 LAB — CBC
Hematocrit: 38.9 % (ref 34.0–46.6)
Hemoglobin: 12.8 g/dL (ref 11.1–15.9)
MCH: 29.5 pg (ref 26.6–33.0)
MCHC: 32.9 g/dL (ref 31.5–35.7)
MCV: 90 fL (ref 79–97)
Platelets: 291 10*3/uL (ref 150–450)
RBC: 4.34 x10E6/uL (ref 3.77–5.28)
RDW: 12.8 % (ref 11.7–15.4)
WBC: 5 10*3/uL (ref 3.4–10.8)

## 2024-01-19 LAB — MICROALBUMIN / CREATININE URINE RATIO
Creatinine, Urine: 30.3 mg/dL
Microalb/Creat Ratio: <10 mg/g{creat} (ref 0–29)
Microalbumin, Urine: <3 ug/mL

## 2024-01-19 LAB — HEMOGLOBIN A1C
Est. average glucose Bld gHb Est-mCnc: 154 mg/dL
Hgb A1c MFr Bld: 7 % — ABNORMAL HIGH (ref 4.8–5.6)

## 2024-01-19 NOTE — Assessment & Plan Note (Signed)
 Blood pressure managed with telmisartan . Prescription expired. - Renewed telmisartan  prescription.

## 2024-01-19 NOTE — Assessment & Plan Note (Signed)
 Cholesterol well-managed with atorvastatin  and ezetimibe . LDL target <70 due to diabetes. - Continue current lipid-lowering therapy.

## 2024-01-19 NOTE — Assessment & Plan Note (Signed)
 Blood glucose generally well-controlled with occasional postprandial elevations. Awaiting current A1c. Insulin degludec  at 12 units daily, no refill issues reported. - Checked A1c level. - Ensured insulin degludec  prescription is up to date.

## 2024-01-24 ENCOUNTER — Ambulatory Visit: Payer: Self-pay | Admitting: Nurse Practitioner

## 2024-01-24 DIAGNOSIS — E113391 Type 2 diabetes mellitus with moderate nonproliferative diabetic retinopathy without macular edema, right eye: Secondary | ICD-10-CM | POA: Diagnosis not present

## 2024-01-24 DIAGNOSIS — H26491 Other secondary cataract, right eye: Secondary | ICD-10-CM | POA: Diagnosis not present

## 2024-01-24 DIAGNOSIS — H04123 Dry eye syndrome of bilateral lacrimal glands: Secondary | ICD-10-CM | POA: Diagnosis not present

## 2024-01-24 DIAGNOSIS — E113312 Type 2 diabetes mellitus with moderate nonproliferative diabetic retinopathy with macular edema, left eye: Secondary | ICD-10-CM | POA: Diagnosis not present

## 2024-01-24 DIAGNOSIS — H1045 Other chronic allergic conjunctivitis: Secondary | ICD-10-CM | POA: Diagnosis not present

## 2024-01-24 DIAGNOSIS — H35372 Puckering of macula, left eye: Secondary | ICD-10-CM | POA: Diagnosis not present

## 2024-01-24 LAB — OPHTHALMOLOGY REPORT-SCANNED

## 2024-01-30 ENCOUNTER — Other Ambulatory Visit: Payer: Self-pay | Admitting: Nurse Practitioner

## 2024-01-30 DIAGNOSIS — R0989 Other specified symptoms and signs involving the circulatory and respiratory systems: Secondary | ICD-10-CM

## 2024-03-06 LAB — OPHTHALMOLOGY REPORT-SCANNED

## 2024-03-27 ENCOUNTER — Other Ambulatory Visit: Payer: Self-pay

## 2024-03-27 DIAGNOSIS — E119 Type 2 diabetes mellitus without complications: Secondary | ICD-10-CM

## 2024-03-27 MED ORDER — RYBELSUS 14 MG PO TABS: 1.0000 | ORAL_TABLET | Freq: Every day | ORAL | 0 refills | Status: AC

## 2024-04-05 ENCOUNTER — Telehealth: Payer: Medicare (Managed Care) | Admitting: Nurse Practitioner

## 2024-04-05 ENCOUNTER — Encounter: Payer: Self-pay | Admitting: Nurse Practitioner

## 2024-04-05 DIAGNOSIS — N898 Other specified noninflammatory disorders of vagina: Secondary | ICD-10-CM

## 2024-04-05 DIAGNOSIS — E113312 Type 2 diabetes mellitus with moderate nonproliferative diabetic retinopathy with macular edema, left eye: Secondary | ICD-10-CM | POA: Diagnosis not present

## 2024-04-05 DIAGNOSIS — Z794 Long term (current) use of insulin: Secondary | ICD-10-CM

## 2024-04-05 MED ORDER — CLOTRIMAZOLE-BETAMETHASONE 1-0.05 % EX CREA: 1.0000 | TOPICAL_CREAM | Freq: Two times a day (BID) | CUTANEOUS | 0 refills | Status: AC

## 2024-04-05 MED ORDER — FLUCONAZOLE 100 MG PO TABS: 100.0000 mg | ORAL_TABLET | Freq: Every day | ORAL | 0 refills | Status: AC

## 2024-04-05 NOTE — Progress Notes (Signed)
 "  Virtual Visit via Video Note  LILLETTE Kristeen JINNY Gladis, CMA,acting as a scribe for Jacqueline Ada, FNP.,have documented all relevant documentation on the behalf of Jacqueline Ada, FNP,as directed by  Jacqueline Ada, FNP while in the presence of Jacqueline Ada, FNP.  I connected with INGEBORG FITE on 04/05/2024 at  4:40 PM EST by a video enabled telemedicine application and verified that I am speaking with the correct person using two identifiers.  Patient Location: Home Provider Location: Home Office  I discussed the limitations, risks, security, and privacy concerns of performing an evaluation and management service by video and the availability of in person appointments. I also discussed with the patient that there may be a patient responsible charge related to this service. The patient expressed understanding and agreed to proceed.  Subjective: PCP: Bell Gaines, FNP  Chief Complaint  Patient presents with   Vaginal Itching    Patient presents today for vaginal itch and dryness for the past few days.    She is having symptoms of vaginal itching and discharge (white), dryness as well. She has been using over the counter yeast cream which has stopped working as effectively. She is using pads in case she can not make it to the bathroom. She has been using scented soaps to the vaginal area thinking the antibacterial in it will help. She also has a lot of itching to the vaginal area and would ike a cream.      ROS: Per HPI Current Medications[1]  Observations/Objective: There were no vitals filed for this visit. Physical Exam Vitals and nursing note reviewed.  Constitutional:      General: She is not in acute distress.    Appearance: Normal appearance. She is obese.  Pulmonary:     Effort: Pulmonary effort is normal. No respiratory distress.  Skin:    Capillary Refill: Capillary refill takes less than 2 seconds.  Neurological:     General: No focal deficit present.     Mental Status:  She is alert and oriented to person, place, and time.     Cranial Nerves: No cranial nerve deficit.  Psychiatric:        Mood and Affect: Mood normal.        Behavior: Behavior normal.        Thought Content: Thought content normal.        Judgment: Judgment normal.     Assessment and Plan: Vaginal itching Assessment & Plan: May be related to farxiga  vs using scented soaps. Advised to avoid using scented soaps and stay well hydrated well taking farxiga . Will treat with longer course of diflucan  if not better she will need a vaginal swab.   Orders: -     Fluconazole ; Take 1 tablet (100 mg total) by mouth daily for 7 days.  Dispense: 7 tablet; Refill: 0 -     Clotrimazole -Betamethasone ; Apply 1 Application topically 2 (two) times daily.  Dispense: 30 g; Refill: 0  Vaginal discharge -     Fluconazole ; Take 1 tablet (100 mg total) by mouth daily for 7 days.  Dispense: 7 tablet; Refill: 0 -     Clotrimazole -Betamethasone ; Apply 1 Application topically 2 (two) times daily.  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 Advised to make sure she is drinking at least  8 oz water a day.      Follow Up Instructions: Return if symptoms worsen or fail to improve.   I discussed the assessment and treatment plan with the patient. The patient was provided an opportunity to ask questions, and all were answered. The patient agreed with the plan and demonstrated an understanding of the instructions.   The patient was advised to call back or seek an in-person evaluation if the symptoms worsen or if the condition fails to improve as anticipated.  The above assessment and management plan was discussed with the patient. The patient verbalized understanding of and has agreed to the  management plan.   LILLETTE Jacqueline Ada, FNP, have reviewed all documentation for this visit. The documentation on 04/05/2024 for the exam, diagnosis, procedures, and orders are all accurate and complete.      [1]  Current Outpatient Medications:    clotrimazole -betamethasone  (LOTRISONE ) cream, Apply 1 Application topically 2 (two) times daily., Disp: 30 g, Rfl: 0   fluconazole  (DIFLUCAN ) 100 MG tablet, Take 1 tablet (100 mg total) by mouth daily for 7 days., Disp: 7 tablet, Rfl: 0   amoxicillin -clavulanate (AUGMENTIN ) 875-125 MG tablet, Take 1 tablet by mouth 2 (two) times daily., Disp: 14 tablet, Rfl: 0   atorvastatin  (LIPITOR) 10 MG tablet, Take 10 mg by mouth daily., Disp: , Rfl:    atorvastatin  (LIPITOR) 20 MG tablet, TAKE 1 TABLET DAILY, Disp: 90 tablet, Rfl: 3   benzonatate  (TESSALON  PERLES) 100 MG capsule, Take 1 capsule (100 mg total) by mouth 3 (three) times daily as needed for cough., Disp: 30 capsule, Rfl: 1   Biotin 5000 MCG CAPS, Take 5,000 mcg by mouth daily., Disp: , Rfl:    cholecalciferol (VITAMIN D ) 1000 units tablet, Take 5,000 Units by mouth daily., Disp: , Rfl:    Continuous Glucose Sensor (FREESTYLE LIBRE 3 SENSOR) MISC, Place 1 sensor on the skin every 14 days. Use to check glucose continuously, Disp: 6 each, Rfl: 3   EPINEPHrine  0.3 mg/0.3 mL IJ SOAJ injection, Inject 0.3 mg into the muscle as needed for anaphylaxis., Disp: 1 each, Rfl: 1   ezetimibe  (ZETIA ) 10 MG tablet, TAKE 1 TABLET DAILY, Disp: 90 tablet, Rfl: 3   fluticasone  (FLONASE ) 50 MCG/ACT nasal spray, SHAKE LIQUID AND USE 2 SPRAYS IN EACH NOSTRIL DAILY, Disp: 48 g, Rfl: 1   glucose blood test strip, Use as instructed to check blood sugar 3 times a day. Dx code E11.65, Disp: 100 each, Rfl: 12   insulin degludec  (TRESIBA  FLEXTOUCH) 100 UNIT/ML FlexTouch Pen, Inject 12 Units into the skin daily., Disp: 15 mL, Rfl: 1   JARDIANCE  25 MG TABS tablet, TAKE 1 TABLET DAILY BEFORE BREAKFAST, Disp: 90 tablet, Rfl: 3    loratadine  (CLARITIN ) 10 MG tablet, TAKE 1 TABLET DAILY, Disp: 90 tablet, Rfl: 3   magnesium oxide (MAG-OX) 400 MG tablet, Take 400 mg by mouth daily., Disp: , Rfl:    meloxicam  (MOBIC ) 15 MG tablet, TAKE 1 TABLET AS NEEDED, Disp: 90 tablet, Rfl: 3   Multiple Vitamin (MULTIVITAMIN) tablet, Take 1 tablet by mouth daily., Disp: , Rfl:    Omega-3 Fatty Acids (FISH OIL PO), Take 2 capsules by mouth daily. , Disp: , Rfl:    Semaglutide  (RYBELSUS ) 14 MG TABS, Take 1 tablet (14 mg total) by mouth daily., Disp: 7 tablet, Rfl: 0   telmisartan  (MICARDIS ) 20 MG tablet, Take 1 tablet (20 mg total) by mouth daily., Disp: 90 tablet, Rfl: 3  "

## 2024-04-07 NOTE — Assessment & Plan Note (Signed)
 May be related to farxiga  vs using scented soaps. Advised to avoid using scented soaps and stay well hydrated well taking farxiga . Will treat with longer course of diflucan  if not better she will need a vaginal swab.

## 2024-04-07 NOTE — Assessment & Plan Note (Signed)
 She is taking farxiga  which may be increasing her vaginal itching. Advised to make sure she is drinking at least 8 oz water a day.

## 2024-05-16 ENCOUNTER — Ambulatory Visit: Admitting: Nurse Practitioner

## 2024-12-06 ENCOUNTER — Ambulatory Visit: Payer: Self-pay
